# Patient Record
Sex: Male | Born: 1945
Health system: Southern US, Community
[De-identification: ages and names within clinical notes are randomized; demographics above are authoritative.]

## PROBLEM LIST (undated history)

## (undated) DIAGNOSIS — I350 Nonrheumatic aortic (valve) stenosis: Secondary | ICD-10-CM

## (undated) DIAGNOSIS — N281 Cyst of kidney, acquired: Secondary | ICD-10-CM

## (undated) DIAGNOSIS — C439 Malignant melanoma of skin, unspecified: Secondary | ICD-10-CM

## (undated) DIAGNOSIS — E785 Hyperlipidemia, unspecified: Secondary | ICD-10-CM

## (undated) DIAGNOSIS — K219 Gastro-esophageal reflux disease without esophagitis: Secondary | ICD-10-CM

## (undated) DIAGNOSIS — K4090 Unilateral inguinal hernia, without obstruction or gangrene, not specified as recurrent: Secondary | ICD-10-CM

## (undated) DIAGNOSIS — I10 Essential (primary) hypertension: Secondary | ICD-10-CM

## (undated) DIAGNOSIS — I7143 Infrarenal abdominal aortic aneurysm, without rupture: Secondary | ICD-10-CM

## (undated) DIAGNOSIS — I77810 Thoracic aortic ectasia: Secondary | ICD-10-CM

## (undated) DIAGNOSIS — M109 Gout, unspecified: Secondary | ICD-10-CM

## (undated) DIAGNOSIS — M1990 Unspecified osteoarthritis, unspecified site: Secondary | ICD-10-CM

## (undated) DIAGNOSIS — L0390 Cellulitis, unspecified: Secondary | ICD-10-CM

## (undated) DIAGNOSIS — I209 Angina pectoris, unspecified: Secondary | ICD-10-CM

## (undated) DIAGNOSIS — Z7902 Long term (current) use of antithrombotics/antiplatelets: Secondary | ICD-10-CM

## (undated) DIAGNOSIS — D689 Coagulation defect, unspecified: Secondary | ICD-10-CM

## (undated) DIAGNOSIS — E669 Obesity, unspecified: Secondary | ICD-10-CM

## (undated) DIAGNOSIS — D649 Anemia, unspecified: Secondary | ICD-10-CM

## (undated) DIAGNOSIS — N200 Calculus of kidney: Secondary | ICD-10-CM

## (undated) DIAGNOSIS — I2510 Atherosclerotic heart disease of native coronary artery without angina pectoris: Secondary | ICD-10-CM

## (undated) DIAGNOSIS — R011 Cardiac murmur, unspecified: Secondary | ICD-10-CM

## (undated) DIAGNOSIS — M51369 Other intervertebral disc degeneration, lumbar region without mention of lumbar back pain or lower extremity pain: Secondary | ICD-10-CM

## (undated) HISTORY — DX: Cellulitis, unspecified: L03.90

## (undated) HISTORY — DX: Atherosclerotic heart disease of native coronary artery without angina pectoris: I25.10

## (undated) HISTORY — DX: Essential (primary) hypertension: I10

## (undated) HISTORY — DX: Unspecified osteoarthritis, unspecified site: M19.90

## (undated) HISTORY — DX: Gout, unspecified: M10.9

## (undated) HISTORY — DX: Obesity, unspecified: E66.9

## (undated) HISTORY — DX: Cardiac murmur, unspecified: R01.1

## (undated) HISTORY — PX: HERNIA REPAIR: SHX51

## (undated) HISTORY — DX: Gastro-esophageal reflux disease without esophagitis: K21.9

## (undated) HISTORY — DX: Anemia, unspecified: D64.9

## (undated) HISTORY — DX: Malignant melanoma of skin, unspecified: C43.9

## (undated) HISTORY — DX: Coagulation defect, unspecified: D68.9

---

## 2004-06-02 ENCOUNTER — Inpatient Hospital Stay: Payer: Self-pay | Admitting: Cardiovascular Disease

## 2004-08-27 HISTORY — PX: CORONARY STENT PLACEMENT: SHX1402

## 2004-08-27 HISTORY — PX: CARDIAC CATHETERIZATION: SHX172

## 2004-12-07 ENCOUNTER — Ambulatory Visit: Payer: Self-pay | Admitting: Cardiovascular Disease

## 2004-12-07 DIAGNOSIS — I2510 Atherosclerotic heart disease of native coronary artery without angina pectoris: Secondary | ICD-10-CM

## 2004-12-07 HISTORY — PX: CORONARY STENT PLACEMENT: SHX1402

## 2004-12-07 HISTORY — DX: Atherosclerotic heart disease of native coronary artery without angina pectoris: I25.10

## 2004-12-07 HISTORY — PX: LEFT HEART CATH AND CORONARY ANGIOGRAPHY: CATH118249

## 2005-01-09 ENCOUNTER — Encounter: Payer: Self-pay | Admitting: Cardiovascular Disease

## 2008-09-10 ENCOUNTER — Ambulatory Visit: Payer: Self-pay | Admitting: Cardiovascular Disease

## 2008-09-10 HISTORY — PX: LEFT HEART CATH AND CORONARY ANGIOGRAPHY: CATH118249

## 2009-12-05 ENCOUNTER — Ambulatory Visit: Payer: Self-pay | Admitting: Family Medicine

## 2009-12-13 ENCOUNTER — Ambulatory Visit: Payer: Self-pay | Admitting: Internal Medicine

## 2013-06-27 DIAGNOSIS — C439 Malignant melanoma of skin, unspecified: Secondary | ICD-10-CM

## 2013-06-27 HISTORY — DX: Malignant melanoma of skin, unspecified: C43.9

## 2013-10-16 ENCOUNTER — Ambulatory Visit: Payer: Self-pay | Admitting: Gastroenterology

## 2014-11-05 ENCOUNTER — Ambulatory Visit: Payer: Self-pay | Admitting: Family Medicine

## 2014-12-07 ENCOUNTER — Institutional Professional Consult (permissible substitution): Payer: Self-pay | Admitting: Internal Medicine

## 2015-04-13 ENCOUNTER — Encounter: Payer: Self-pay | Admitting: Family Medicine

## 2015-04-13 ENCOUNTER — Ambulatory Visit (INDEPENDENT_AMBULATORY_CARE_PROVIDER_SITE_OTHER): Payer: 59 | Admitting: Family Medicine

## 2015-04-13 VITALS — BP 119/79 | HR 69 | Temp 98.6°F | Ht 69.4 in | Wt 198.0 lb

## 2015-04-13 DIAGNOSIS — E785 Hyperlipidemia, unspecified: Secondary | ICD-10-CM | POA: Diagnosis not present

## 2015-04-13 DIAGNOSIS — I10 Essential (primary) hypertension: Secondary | ICD-10-CM | POA: Diagnosis not present

## 2015-04-13 DIAGNOSIS — M1000 Idiopathic gout, unspecified site: Secondary | ICD-10-CM

## 2015-04-13 DIAGNOSIS — I2510 Atherosclerotic heart disease of native coronary artery without angina pectoris: Secondary | ICD-10-CM

## 2015-04-13 DIAGNOSIS — I2583 Coronary atherosclerosis due to lipid rich plaque: Secondary | ICD-10-CM

## 2015-04-13 DIAGNOSIS — M109 Gout, unspecified: Secondary | ICD-10-CM | POA: Insufficient documentation

## 2015-04-13 LAB — LP+ALT+AST PICCOLO, WAIVED
ALT (SGPT) PICCOLO, WAIVED: 30 U/L (ref 10–47)
AST (SGOT) PICCOLO, WAIVED: 24 U/L (ref 11–38)
CHOL/HDL RATIO PICCOLO,WAIVE: 4.1 mg/dL
CHOLESTEROL PICCOLO, WAIVED: 161 mg/dL (ref <200)
HDL CHOL PICCOLO, WAIVED: 40 mg/dL — AB (ref >59)
LDL Chol Calc Piccolo Waived: 90 mg/dL (ref <100)
TRIGLYCERIDES PICCOLO,WAIVED: 159 mg/dL — AB (ref <150)
VLDL Chol Calc Piccolo,Waive: 32 mg/dL — ABNORMAL HIGH (ref <30)

## 2015-04-13 MED ORDER — ALLOPURINOL 100 MG PO TABS: 100.0000 mg | ORAL_TABLET | Freq: Every day | ORAL | Status: DC

## 2015-04-13 MED ORDER — CLOPIDOGREL BISULFATE 75 MG PO TABS: 75.0000 mg | ORAL_TABLET | Freq: Every day | ORAL | Status: DC

## 2015-04-13 MED ORDER — LISINOPRIL 20 MG PO TABS: 20.0000 mg | ORAL_TABLET | Freq: Every day | ORAL | Status: DC

## 2015-04-13 MED ORDER — METOPROLOL SUCCINATE ER 50 MG PO TB24: 50.0000 mg | ORAL_TABLET | Freq: Every day | ORAL | Status: DC

## 2015-04-13 MED ORDER — MELOXICAM 15 MG PO TABS: 15.0000 mg | ORAL_TABLET | Freq: Every day | ORAL | Status: DC

## 2015-04-13 MED ORDER — RABEPRAZOLE SODIUM 20 MG PO TBEC: 20.0000 mg | DELAYED_RELEASE_TABLET | Freq: Every day | ORAL | Status: DC

## 2015-04-13 MED ORDER — EZETIMIBE-SIMVASTATIN 10-40 MG PO TABS: 1.0000 | ORAL_TABLET | Freq: Every day | ORAL | Status: DC

## 2015-04-13 NOTE — Progress Notes (Signed)
BP 119/79 mmHg  Pulse 69  Temp(Src) 98.6 F (37 C)  Ht 5' 9.4" (1.763 m)  Wt 198 lb (89.812 kg)  BMI 28.90 kg/m2  SpO2 98%   Subjective:    Patient ID: Mark Quinn, male    DOB: May 29, 1946, 69 y.o.   MRN: [id number]  HPI: Mark Quinn is a 69 y.o. male  Chief Complaint  Patient presents with  . Hypertension  . Hyperlipidemia   patient for follow-up medications doing well with blood pressure cholesterol. Occasional slight sore toe but nothing special taking allopurinol without problems. Stomach and reflux doing well with no issues Takes medications faithfully with no side effects.  Patient's cough is gotten better with cleaning out mold in a closed up room. No further cough symptoms. Relevant past medical, surgical, family and social history reviewed and updated as indicated. Interim medical history since our last visit reviewed.[id number] Allergies and medications reviewed and updated.  Review of Systems  Constitutional: Negative.   Respiratory: Negative.   Cardiovascular: Negative.     Per HPI unless specifically indicated above     Objective:    BP 119/79 mmHg  Pulse 69  Temp(Src) 98.6 F (37 C)  Ht 5' 9.4" (1.763 m)  Wt 198 lb (89.812 kg)  BMI 28.90 kg/m2  SpO2 98%  Wt Readings from Last 3 Encounters:  04/13/15 198 lb (89.812 kg)  11/05/14 201 lb (91.173 kg)    Physical Exam  Constitutional: He is oriented to person, place, and time. He appears well-developed and well-nourished. No distress.  HENT:  Head: Normocephalic and atraumatic.  Right Ear: Hearing normal.  Left Ear: Hearing normal.  Nose: Nose normal.  Eyes: Conjunctivae and lids are normal. Right eye exhibits no discharge. Left eye exhibits no discharge. No scleral icterus.  Cardiovascular: Normal rate, regular rhythm and normal heart sounds.   Pulmonary/Chest: Effort normal and breath sounds normal. No respiratory distress.  Musculoskeletal: Normal range of motion.   Neurological: He is alert and oriented to person, place, and time.  Skin: Skin is intact. No rash noted.  Psychiatric: He has a normal mood and affect. His speech is normal and behavior is normal. Judgment and thought content normal. Cognition and memory are normal.    No results found for this or any previous visit.    Assessment & Plan:   Problem List Items Addressed This Visit      Cardiovascular and Mediastinum   Essential hypertension, benign    The current medical regimen is effective;  continue present plan and medications.       Relevant Medications   metoprolol succinate (TOPROL-XL) 50 MG 24 hr tablet   lisinopril (PRINIVIL,ZESTRIL) 20 MG tablet   ezetimibe-simvastatin (VYTORIN) 10-40 MG per tablet   Other Relevant Orders   LP+ALT+AST Piccolo, Waived   Basic metabolic panel   CAD (coronary artery disease)    Stable no signs or sx      Relevant Medications   clopidogrel (PLAVIX) 75 MG tablet   metoprolol succinate (TOPROL-XL) 50 MG 24 hr tablet   lisinopril (PRINIVIL,ZESTRIL) 20 MG tablet   ezetimibe-simvastatin (VYTORIN) 10-40 MG per tablet     Other   Hyperlipemia - Primary    The current medical regimen is effective;  continue present plan and medications.       Relevant Medications   metoprolol succinate (TOPROL-XL) 50 MG 24 hr tablet   lisinopril (PRINIVIL,ZESTRIL) 20 MG tablet   ezetimibe-simvastatin (VYTORIN) 10-40 MG per tablet   Other  Relevant Orders   LP+ALT+AST Piccolo, Waived   Basic metabolic panel   Gout    The current medical regimen is effective;  continue present plan and medications.       Relevant Medications   meloxicam (MOBIC) 15 MG tablet   allopurinol (ZYLOPRIM) 100 MG tablet       Follow up plan: Return in about 6 months (around 10/14/2015), or if symptoms worsen or fail to improve, for Physical Exam.

## 2015-04-13 NOTE — Assessment & Plan Note (Signed)
The current medical regimen is effective;  continue present plan and medications.  

## 2015-04-13 NOTE — Assessment & Plan Note (Signed)
Stable no signs or sx

## 2015-04-14 ENCOUNTER — Encounter: Payer: Self-pay | Admitting: Family Medicine

## 2015-04-14 LAB — BASIC METABOLIC PANEL
BUN / CREAT RATIO: 18 (ref 10–22)
BUN: 18 mg/dL (ref 8–27)
CHLORIDE: 101 mmol/L (ref 97–108)
CO2: 25 mmol/L (ref 18–29)
CREATININE: 0.99 mg/dL (ref 0.76–1.27)
Calcium: 9.2 mg/dL (ref 8.6–10.2)
GFR calc Af Amer: 91 mL/min/{1.73_m2} (ref >59)
GFR calc non Af Amer: 79 mL/min/{1.73_m2} (ref >59)
GLUCOSE: 97 mg/dL (ref 65–99)
POTASSIUM: 3.9 mmol/L (ref 3.5–5.2)
SODIUM: 142 mmol/L (ref 134–144)

## 2015-05-09 ENCOUNTER — Other Ambulatory Visit: Payer: Self-pay | Admitting: Family Medicine

## 2015-05-26 ENCOUNTER — Other Ambulatory Visit: Payer: Self-pay | Admitting: Family Medicine

## 2015-07-26 ENCOUNTER — Encounter: Payer: Self-pay | Admitting: Family Medicine

## 2015-07-26 ENCOUNTER — Ambulatory Visit (INDEPENDENT_AMBULATORY_CARE_PROVIDER_SITE_OTHER): Payer: 59 | Admitting: Family Medicine

## 2015-07-26 VITALS — BP 128/78 | HR 102 | Temp 99.1°F | Ht 69.4 in | Wt 201.0 lb

## 2015-07-26 DIAGNOSIS — J019 Acute sinusitis, unspecified: Secondary | ICD-10-CM

## 2015-07-26 DIAGNOSIS — J329 Chronic sinusitis, unspecified: Secondary | ICD-10-CM | POA: Insufficient documentation

## 2015-07-26 MED ORDER — HYDROCOD POLST-CPM POLST ER 10-8 MG/5ML PO SUER: 2.5000 mL | Freq: Two times a day (BID) | ORAL | Status: DC | PRN

## 2015-07-26 MED ORDER — AMOXICILLIN 875 MG PO TABS: 875.0000 mg | ORAL_TABLET | Freq: Two times a day (BID) | ORAL | Status: DC

## 2015-07-26 NOTE — Progress Notes (Signed)
BP 128/78 mmHg  Pulse 102  Temp(Src) 99.1 F (37.3 C)  Ht 5' 9.4" (1.763 m)  Wt 201 lb (91.173 kg)  BMI 29.33 kg/m2  SpO2 96%   Subjective:    Patient ID: Mark Quinn, male    DOB: 09/19/1945, 69 y.o.   MRN: [id number]  HPI: Mark Quinn is a 69 y.o. male  Chief Complaint  Patient presents with  . URI    x 5days   , Sinus pressure congestion feeling bad drainage is use Mucinex D and Tylenol with some relief developed a cough and green stuff out of his nose. Has been running a fever and generalized achiness feels bad all over  Relevant past medical, surgical, family and social history reviewed and updated as indicated. Interim medical history since our last visit reviewed. Allergies and medications reviewed and updated.  Review of Systems  Constitutional: Positive for fever, chills and fatigue.  HENT: Positive for congestion, ear pain, rhinorrhea, sinus pressure, sneezing and sore throat.   Respiratory: Positive for cough. Negative for wheezing.   Cardiovascular: Negative for chest pain, palpitations and leg swelling.  Gastrointestinal: Negative.     Per HPI unless specifically indicated above     Objective:    BP 128/78 mmHg  Pulse 102  Temp(Src) 99.1 F (37.3 C)  Ht 5' 9.4" (1.763 m)  Wt 201 lb (91.173 kg)  BMI 29.33 kg/m2  SpO2 96%  Wt Readings from Last 3 Encounters:  07/26/15 201 lb (91.173 kg)  04/13/15 198 lb (89.812 kg)  11/05/14 201 lb (91.173 kg)    Physical Exam  Constitutional: He is oriented to person, place, and time. He appears well-developed and well-nourished. No distress.  HENT:  Head: Normocephalic and atraumatic.  Right Ear: Hearing and external ear normal.  Left Ear: Hearing and external ear normal.  Nose: Nose normal.  Mouth/Throat: Oropharyngeal exudate present.  Eyes: Conjunctivae and lids are normal. Right eye exhibits no discharge. Left eye exhibits no discharge. No scleral icterus.  Cardiovascular: Normal rate and regular  rhythm.   Pulmonary/Chest: Effort normal and breath sounds normal. No respiratory distress. He has no wheezes. He has no rales. He exhibits no tenderness.  Musculoskeletal: Normal range of motion.  Lymphadenopathy:    He has no cervical adenopathy.  Neurological: He is alert and oriented to person, place, and time.  Skin: Skin is intact. No rash noted.  Psychiatric: He has a normal mood and affect. His speech is normal and behavior is normal. Judgment and thought content normal. Cognition and memory are normal.    Results for orders placed or performed in visit on 04/13/15  LP+ALT+AST Piccolo, [employer]  Result Value Ref Range   ALT (SGPT) Piccolo, Waived 30 10 - 47 U/L   AST (SGOT) Piccolo, Waived 24 11 - 38 U/L   Cholesterol Piccolo, Waived 161 <200 mg/dL   HDL Chol Piccolo, Waived 40 (L) >59 mg/dL   Triglycerides Piccolo,Waived 159 (H) <150 mg/dL   Chol/HDL Ratio Piccolo,Waive 4.1 mg/dL   LDL Chol Calc Piccolo Waived 90 <100 mg/dL   VLDL Chol Calc Piccolo,Waive 32 (H) <30 mg/dL  Basic metabolic panel  Result Value Ref Range   Glucose 97 65 - 99 mg/dL   BUN 18 8 - 27 mg/dL   Creatinine, Ser 0.99 0.76 - 1.27 mg/dL   GFR calc non Af Amer 79 >59 mL/min/1.73   GFR calc Af Amer 91 >59 mL/min/1.73   BUN/Creatinine Ratio 18 10 - 22  Sodium 142 134 - 144 mmol/L   Potassium 3.9 3.5 - 5.2 mmol/L   Chloride 101 97 - 108 mmol/L   CO2 25 18 - 29 mmol/L   Calcium 9.2 8.6 - 10.2 mg/dL      Assessment & Plan:   Problem List Items Addressed This Visit      Respiratory   Sinusitis - Primary    Discussed sinusitis care and treatment use of Mucinex D, Tylenol, antibiotics Staying out of work Cough medicine and cautioned about driving and staying at home with codeine containing medication      Relevant Medications   amoxicillin (AMOXIL) 875 MG tablet   chlorpheniramine-HYDROcodone (TUSSIONEX PENNKINETIC ER) 10-8 MG/5ML SUER       Follow up plan: Return for As scheduled.

## 2015-07-26 NOTE — Assessment & Plan Note (Signed)
Discussed sinusitis care and treatment use of Mucinex D, Tylenol, antibiotics Staying out of work Cough medicine and cautioned about driving and staying at home with codeine containing medication

## 2015-09-26 ENCOUNTER — Telehealth: Payer: Self-pay | Admitting: Family Medicine

## 2015-09-26 ENCOUNTER — Encounter: Payer: 59 | Admitting: Family Medicine

## 2015-09-26 ENCOUNTER — Other Ambulatory Visit: Payer: Self-pay | Admitting: Family Medicine

## 2015-09-26 MED ORDER — AMOXICILLIN 875 MG PO TABS: 875.0000 mg | ORAL_TABLET | Freq: Two times a day (BID) | ORAL | Status: DC

## 2015-09-26 NOTE — Telephone Encounter (Signed)
Pt's wife called stated pt has the same thing he had right around Thanksgiving. Wants to know if an antibiotic can be called in. Pt informed an appt would likely be needed due to the amount of time since that illness. Pt's wife instructed staff to send a message just in case. Pharm is [facility] in [place]. Thanks.  sent

## 2015-09-26 NOTE — Telephone Encounter (Signed)
Pt's wife called stated pt has the same thing he had right around Thanksgiving. Wants to know if an antibiotic can be called in. Pt informed an appt would likely be needed due to the amount of time since that illness. Pt's wife instructed staff to send a message just in case. Pharm is [facility] in [place]. Thanks.

## 2015-09-29 ENCOUNTER — Encounter: Payer: Self-pay | Admitting: Family Medicine

## 2015-09-29 ENCOUNTER — Ambulatory Visit (INDEPENDENT_AMBULATORY_CARE_PROVIDER_SITE_OTHER): Payer: 59 | Admitting: Family Medicine

## 2015-09-29 VITALS — BP 137/82 | HR 69 | Temp 98.0°F | Ht 71.1 in | Wt 199.0 lb

## 2015-09-29 DIAGNOSIS — J019 Acute sinusitis, unspecified: Secondary | ICD-10-CM

## 2015-09-29 MED ORDER — HYDROCOD POLST-CPM POLST ER 10-8 MG/5ML PO SUER: 2.5000 mL | Freq: Two times a day (BID) | ORAL | Status: DC | PRN

## 2015-09-29 NOTE — Assessment & Plan Note (Signed)
Discussed care and treatment of sinusitis Will continue antibiotic will also give Tussionex today

## 2015-09-29 NOTE — Progress Notes (Signed)
BP 137/82 mmHg  Pulse 69  Temp(Src) 98 F (36.7 C)  Ht 5' 11.1" (1.806 m)  Wt 199 lb (90.266 kg)  BMI 27.68 kg/m2  SpO2 99%   Subjective:    Patient ID: Mark Quinn, male    DOB: 1946/05/24, 70 y.o.   MRN: [id number]  HPI: Mark Quinn is a 70 y.o. male  Chief Complaint  Patient presents with  . URI    chills, coughing  Fever ache patient doing better with antibiotic that was called in 3 days ago quit having fever and chill has been able to go back to work No side effects from medications Still coughing real bad at night wants some cough medicine   Relevant past medical, surgical, family and social history reviewed and updated as indicated. Interim medical history since our last visit reviewed. Allergies and medications reviewed and updated.  Review of Systems  Per HPI unless specifically indicated above     Objective:    BP 137/82 mmHg  Pulse 69  Temp(Src) 98 F (36.7 C)  Ht 5' 11.1" (1.806 m)  Wt 199 lb (90.266 kg)  BMI 27.68 kg/m2  SpO2 99%  Wt Readings from Last 3 Encounters:  09/29/15 199 lb (90.266 kg)  07/26/15 201 lb (91.173 kg)  04/13/15 198 lb (89.812 kg)    Physical Exam  Constitutional: He is oriented to person, place, and time. He appears well-developed and well-nourished. No distress.  HENT:  Head: Normocephalic and atraumatic.  Right Ear: Hearing normal.  Left Ear: Hearing normal.  Nose: Nose normal.  Eyes: Conjunctivae and lids are normal. Right eye exhibits no discharge. Left eye exhibits no discharge. No scleral icterus.  Cardiovascular: Normal rate, regular rhythm and normal heart sounds.   Pulmonary/Chest: Effort normal and breath sounds normal. No respiratory distress.  Musculoskeletal: Normal range of motion.  Lymphadenopathy:    He has no cervical adenopathy.  Neurological: He is alert and oriented to person, place, and time.  Skin: Skin is intact. No rash noted.  Psychiatric: He has a normal mood and affect. His speech is  normal and behavior is normal. Judgment and thought content normal. Cognition and memory are normal.    Results for orders placed or performed in visit on 04/13/15  LP+ALT+AST Piccolo, [employer]  Result Value Ref Range   ALT (SGPT) Piccolo, Waived 30 10 - 47 U/L   AST (SGOT) Piccolo, Waived 24 11 - 38 U/L   Cholesterol Piccolo, Waived 161 <200 mg/dL   HDL Chol Piccolo, Waived 40 (L) >59 mg/dL   Triglycerides Piccolo,Waived 159 (H) <150 mg/dL   Chol/HDL Ratio Piccolo,Waive 4.1 mg/dL   LDL Chol Calc Piccolo Waived 90 <100 mg/dL   VLDL Chol Calc Piccolo,Waive 32 (H) <30 mg/dL  Basic metabolic panel  Result Value Ref Range   Glucose 97 65 - 99 mg/dL   BUN 18 8 - 27 mg/dL   Creatinine, Ser 0.99 0.76 - 1.27 mg/dL   GFR calc non Af Amer 79 >59 mL/min/1.73   GFR calc Af Amer 91 >59 mL/min/1.73   BUN/Creatinine Ratio 18 10 - 22   Sodium 142 134 - 144 mmol/L   Potassium 3.9 3.5 - 5.2 mmol/L   Chloride 101 97 - 108 mmol/L   CO2 25 18 - 29 mmol/L   Calcium 9.2 8.6 - 10.2 mg/dL      Assessment & Plan:   Problem List Items Addressed This Visit      Respiratory   Sinusitis -  Primary    Discussed care and treatment of sinusitis Will continue antibiotic will also give Tussionex today      Relevant Medications   chlorpheniramine-HYDROcodone (TUSSIONEX PENNKINETIC ER) 10-8 MG/5ML SUER       Follow up plan: Return for Physical Exam.

## 2015-10-28 ENCOUNTER — Encounter: Payer: Self-pay | Admitting: Unknown Physician Specialty

## 2015-10-28 ENCOUNTER — Ambulatory Visit (INDEPENDENT_AMBULATORY_CARE_PROVIDER_SITE_OTHER): Payer: 59 | Admitting: Unknown Physician Specialty

## 2015-10-28 VITALS — BP 147/93 | HR 105 | Temp 99.3°F | Ht 69.2 in | Wt 196.2 lb

## 2015-10-28 DIAGNOSIS — R059 Cough, unspecified: Secondary | ICD-10-CM

## 2015-10-28 DIAGNOSIS — R350 Frequency of micturition: Secondary | ICD-10-CM | POA: Diagnosis not present

## 2015-10-28 DIAGNOSIS — N400 Enlarged prostate without lower urinary tract symptoms: Secondary | ICD-10-CM | POA: Diagnosis not present

## 2015-10-28 DIAGNOSIS — N410 Acute prostatitis: Secondary | ICD-10-CM

## 2015-10-28 DIAGNOSIS — R05 Cough: Secondary | ICD-10-CM | POA: Diagnosis not present

## 2015-10-28 LAB — PLEASE NOTE:

## 2015-10-28 LAB — INFLUENZA A AND B
Influenza A Ag, EIA: NEGATIVE
Influenza B Ag, EIA: NEGATIVE

## 2015-10-28 MED ORDER — SULFAMETHOXAZOLE-TRIMETHOPRIM 800-160 MG PO TABS: 1.0000 | ORAL_TABLET | Freq: Two times a day (BID) | ORAL | Status: DC

## 2015-10-28 MED ORDER — TAMSULOSIN HCL 0.4 MG PO CAPS: 0.4000 mg | ORAL_CAPSULE | Freq: Every day | ORAL | Status: DC

## 2015-10-28 NOTE — Progress Notes (Signed)
BP 147/93 mmHg  Pulse 105  Temp(Src) 99.3 F (37.4 C)  Ht 5' 9.2" (1.758 m)  Wt 196 lb 3.2 oz (88.996 kg)  BMI 28.80 kg/m2  SpO2 95%   Subjective:    Patient ID: Mark Quinn, male    DOB: 1945-12-29, 70 y.o.   MRN: [id number]  HPI: Mark Quinn is a 70 y.o. male  Chief Complaint  Patient presents with  . URI    pt states he has been having headaches, body aches, chills, cough, and trouble urinating. States symptoms started Wednesday morning.   Pt with a 2 day history that started Wednesday night but Thursday morning things got much worse.  No thermometer but was having chills and sweats. Problems urinating started at the same time.  His cough is minimal but has a headache when he coughs.   He does have a history of a flu shot.  No nausea, vomiting, and diarrhea.    Relevant past medical, surgical, family and social history reviewed and updated as indicated. Interim medical history since our last visit reviewed. Allergies and medications reviewed and updated.  Review of Systems  All other systems reviewed and are negative.   Per HPI unless specifically indicated above     Objective:    BP 147/93 mmHg  Pulse 105  Temp(Src) 99.3 F (37.4 C)  Ht 5' 9.2" (1.758 m)  Wt 196 lb 3.2 oz (88.996 kg)  BMI 28.80 kg/m2  SpO2 95%  Wt Readings from Last 3 Encounters:  10/28/15 196 lb 3.2 oz (88.996 kg)  09/29/15 199 lb (90.266 kg)  07/26/15 201 lb (91.173 kg)    Physical Exam  Constitutional: He is oriented to person, place, and time. He appears well-developed and well-nourished. No distress.  HENT:  Head: Normocephalic and atraumatic.  Right Ear: Tympanic membrane and ear canal normal.  Left Ear: Tympanic membrane and ear canal normal.  Nose: Rhinorrhea present. Right sinus exhibits no maxillary sinus tenderness and no frontal sinus tenderness. Left sinus exhibits no maxillary sinus tenderness and no frontal sinus tenderness.  Mouth/Throat: Uvula is midline. Posterior  oropharyngeal edema present.  Eyes: Conjunctivae and lids are normal. Right eye exhibits no discharge. Left eye exhibits no discharge. No scleral icterus.  Neck: Neck supple.  Cardiovascular: Normal rate, regular rhythm and normal heart sounds.   Pulmonary/Chest: Effort normal and breath sounds normal. No respiratory distress.  Abdominal: Normal appearance. There is no splenomegaly or hepatomegaly.  Genitourinary: Prostate is enlarged and tender.  Musculoskeletal: Normal range of motion.  Neurological: He is alert and oriented to person, place, and time.  Skin: Skin is warm, dry and intact. No rash noted. No pallor.  Psychiatric: He has a normal mood and affect. His behavior is normal. Judgment and thought content normal.  Nursing note and vitals reviewed.   Results for orders placed or performed in visit on 04/13/15  LP+ALT+AST Piccolo, Waived  Result Value Ref Range   ALT (SGPT) Piccolo, Waived 30 10 - 47 U/L   AST (SGOT) Piccolo, Waived 24 11 - 38 U/L   Cholesterol Piccolo, Waived 161 <200 mg/dL   HDL Chol Piccolo, Waived 40 (L) >59 mg/dL   Triglycerides Piccolo,Waived 159 (H) <150 mg/dL   Chol/HDL Ratio Piccolo,Waive 4.1 mg/dL   LDL Chol Calc Piccolo Waived 90 <100 mg/dL   VLDL Chol Calc Piccolo,Waive 32 (H) <30 mg/dL  Basic metabolic panel  Result Value Ref Range   Glucose 97 65 - 99 mg/dL   BUN  18 8 - 27 mg/dL   Creatinine, Ser 0.99 0.76 - 1.27 mg/dL   GFR calc non Af Amer 79 >59 mL/min/1.73   GFR calc Af Amer 91 >59 mL/min/1.73   BUN/Creatinine Ratio 18 10 - 22   Sodium 142 134 - 144 mmol/L   Potassium 3.9 3.5 - 5.2 mmol/L   Chloride 101 97 - 108 mmol/L   CO2 25 18 - 29 mmol/L   Calcium 9.2 8.6 - 10.2 mg/dL      Assessment & Plan:   Problem List Items Addressed This Visit      Unprioritized   BPH (benign prostatic hyperplasia)   Relevant Medications   tamsulosin (FLOMAX) 0.4 MG CAPS capsule    Other Visit Diagnoses    Cough    -  Primary    Relevant Orders     Influenza a and b    Urinary frequency        Relevant Orders    UA/M w/rflx Culture, Routine    Acute prostatitis        Rx for Septra BID.  Flomax to help with urination.  Follow-up with Dr. Jeananne Rama in about 2 weeks.          Follow up plan: No Follow-up on file.

## 2015-10-28 NOTE — Patient Instructions (Signed)

## 2015-11-01 ENCOUNTER — Other Ambulatory Visit: Payer: Self-pay

## 2015-11-01 ENCOUNTER — Telehealth: Payer: Self-pay | Admitting: Family Medicine

## 2015-11-01 DIAGNOSIS — E785 Hyperlipidemia, unspecified: Secondary | ICD-10-CM

## 2015-11-01 DIAGNOSIS — I10 Essential (primary) hypertension: Secondary | ICD-10-CM

## 2015-11-01 DIAGNOSIS — I2583 Coronary atherosclerosis due to lipid rich plaque: Secondary | ICD-10-CM

## 2015-11-01 DIAGNOSIS — I2510 Atherosclerotic heart disease of native coronary artery without angina pectoris: Secondary | ICD-10-CM

## 2015-11-01 DIAGNOSIS — M1000 Idiopathic gout, unspecified site: Secondary | ICD-10-CM

## 2015-11-01 DIAGNOSIS — Z113 Encounter for screening for infections with a predominantly sexual mode of transmission: Secondary | ICD-10-CM

## 2015-11-01 DIAGNOSIS — N400 Enlarged prostate without lower urinary tract symptoms: Secondary | ICD-10-CM

## 2015-11-01 NOTE — Telephone Encounter (Signed)
Pt's wife called asked that whoever returns their call please call the house number. Thanks

## 2015-11-01 NOTE — Telephone Encounter (Signed)
Pt would like to come in Tuesday afternoon to have labs drawn for cpe on the 16th.

## 2015-11-01 NOTE — Telephone Encounter (Signed)
Labs ordered.

## 2015-11-01 NOTE — Telephone Encounter (Signed)
This patient wants to come in early for labs, CPE on 11/10/15  Please enter what you want.

## 2015-11-03 LAB — MICROSCOPIC EXAMINATION: Epithelial Cells (non renal): NONE SEEN /hpf (ref 0–10)

## 2015-11-03 LAB — UA/M W/RFLX CULTURE, ROUTINE
BILIRUBIN UA: NEGATIVE
GLUCOSE, UA: NEGATIVE
NITRITE UA: NEGATIVE
SPEC GRAV UA: 1.025 (ref 1.005–1.030)
Urobilinogen, Ur: 1 mg/dL (ref 0.2–1.0)
pH, UA: 6 (ref 5.0–7.5)

## 2015-11-03 LAB — URINE CULTURE, REFLEX

## 2015-11-07 ENCOUNTER — Other Ambulatory Visit: Payer: 59

## 2015-11-10 ENCOUNTER — Encounter: Payer: Self-pay | Admitting: Family Medicine

## 2015-11-10 ENCOUNTER — Ambulatory Visit (INDEPENDENT_AMBULATORY_CARE_PROVIDER_SITE_OTHER): Payer: 59 | Admitting: Family Medicine

## 2015-11-10 VITALS — BP 103/70 | HR 82 | Temp 98.9°F | Ht 69.7 in | Wt 196.0 lb

## 2015-11-10 DIAGNOSIS — E785 Hyperlipidemia, unspecified: Secondary | ICD-10-CM

## 2015-11-10 DIAGNOSIS — I2510 Atherosclerotic heart disease of native coronary artery without angina pectoris: Secondary | ICD-10-CM | POA: Diagnosis not present

## 2015-11-10 DIAGNOSIS — I10 Essential (primary) hypertension: Secondary | ICD-10-CM

## 2015-11-10 DIAGNOSIS — C4361 Malignant melanoma of right upper limb, including shoulder: Secondary | ICD-10-CM

## 2015-11-10 DIAGNOSIS — Z113 Encounter for screening for infections with a predominantly sexual mode of transmission: Secondary | ICD-10-CM

## 2015-11-10 DIAGNOSIS — M1000 Idiopathic gout, unspecified site: Secondary | ICD-10-CM | POA: Diagnosis not present

## 2015-11-10 DIAGNOSIS — I2583 Coronary atherosclerosis due to lipid rich plaque: Secondary | ICD-10-CM

## 2015-11-10 DIAGNOSIS — Z0000 Encounter for general adult medical examination without abnormal findings: Secondary | ICD-10-CM

## 2015-11-10 DIAGNOSIS — N400 Enlarged prostate without lower urinary tract symptoms: Secondary | ICD-10-CM

## 2015-11-10 LAB — URINALYSIS, ROUTINE W REFLEX MICROSCOPIC
Bilirubin, UA: NEGATIVE
GLUCOSE, UA: NEGATIVE
KETONES UA: NEGATIVE
Leukocytes, UA: NEGATIVE
NITRITE UA: NEGATIVE
Protein, UA: NEGATIVE
UUROB: 0.2 mg/dL (ref 0.2–1.0)
pH, UA: 6 (ref 5.0–7.5)

## 2015-11-10 LAB — MICROSCOPIC EXAMINATION

## 2015-11-10 MED ORDER — CLOPIDOGREL BISULFATE 75 MG PO TABS: 75.0000 mg | ORAL_TABLET | Freq: Every day | ORAL | Status: DC

## 2015-11-10 MED ORDER — RABEPRAZOLE SODIUM 20 MG PO TBEC: 20.0000 mg | DELAYED_RELEASE_TABLET | Freq: Every day | ORAL | Status: DC

## 2015-11-10 MED ORDER — EZETIMIBE-SIMVASTATIN 10-40 MG PO TABS: 1.0000 | ORAL_TABLET | Freq: Every day | ORAL | Status: DC

## 2015-11-10 MED ORDER — ALLOPURINOL 100 MG PO TABS: 100.0000 mg | ORAL_TABLET | Freq: Every day | ORAL | Status: DC

## 2015-11-10 MED ORDER — METOPROLOL SUCCINATE ER 50 MG PO TB24: 50.0000 mg | ORAL_TABLET | Freq: Every day | ORAL | Status: DC

## 2015-11-10 MED ORDER — LISINOPRIL 20 MG PO TABS: 20.0000 mg | ORAL_TABLET | Freq: Every day | ORAL | Status: DC

## 2015-11-10 MED ORDER — TAMSULOSIN HCL 0.4 MG PO CAPS: 0.4000 mg | ORAL_CAPSULE | Freq: Every day | ORAL | Status: DC

## 2015-11-10 NOTE — Progress Notes (Signed)
BP 103/70 mmHg  Pulse 82  Temp(Src) 98.9 F (37.2 C)  Ht 5' 9.7" (1.77 m)  Wt 196 lb (88.905 kg)  BMI 28.38 kg/m2  SpO2 98%   Subjective:    Patient ID: Mark Quinn, male    DOB: 07-02-1946, 70 y.o.   MRN: [id number]  HPI: Mark Quinn is a 70 y.o. male  Chief Complaint  Patient presents with  . Annual Exam  . Patient's wife had called to mention patient having some SOB   patient's prostate infection has resolved patient feeling much better finishing up Bactrim and doing well no side effects taking Flomax which is really helped and wants to continue this doesn't have to get up as much at night. Blood pressure medicine cholesterol doing well with no complaints Taking Plavix no issues Reflux stable Not using an inhaler   Relevant past medical, surgical, family and social history reviewed and updated as indicated. Interim medical history since our last visit reviewed. Allergies and medications reviewed and updated.  Review of Systems  Constitutional: Negative.   HENT: Negative.   Eyes: Negative.   Respiratory: Positive for shortness of breath.        Mild breathing hard no chest pain  Cardiovascular: Negative.   Gastrointestinal: Negative.   Endocrine: Negative.   Genitourinary: Negative.   Musculoskeletal: Negative.   Skin: Negative.   Allergic/Immunologic: Negative.   Neurological: Negative.   Hematological: Negative.   Psychiatric/Behavioral: Negative.     Per HPI unless specifically indicated above     Objective:    BP 103/70 mmHg  Pulse 82  Temp(Src) 98.9 F (37.2 C)  Ht 5' 9.7" (1.77 m)  Wt 196 lb (88.905 kg)  BMI 28.38 kg/m2  SpO2 98%  Wt Readings from Last 3 Encounters:  11/10/15 196 lb (88.905 kg)  10/28/15 196 lb 3.2 oz (88.996 kg)  09/29/15 199 lb (90.266 kg)    Physical Exam  Constitutional: He is oriented to person, place, and time. He appears well-developed and well-nourished.  HENT:  Head: Normocephalic.  Right Ear: External  ear normal.  Left Ear: External ear normal.  Nose: Nose normal.  Eyes: Conjunctivae and EOM are normal. Pupils are equal, round, and reactive to light.  Neck: Normal range of motion. Neck supple. No thyromegaly present.  Cardiovascular: Normal rate, regular rhythm, normal heart sounds and intact distal pulses.   Pulmonary/Chest: Effort normal and breath sounds normal.  Abdominal: Soft. Bowel sounds are normal. There is no splenomegaly or hepatomegaly.  Genitourinary: Penis normal.  Musculoskeletal: Normal range of motion.  Lymphadenopathy:    He has no cervical adenopathy.  Neurological: He is alert and oriented to person, place, and time. He has normal reflexes.  Skin: Skin is warm and dry.  Psychiatric: He has a normal mood and affect. His behavior is normal. Judgment and thought content normal.    Results for orders placed or performed in visit on 10/28/15  Influenza a and b  Result Value Ref Range   Influenza A Ag, EIA Negative Negative   Influenza B Ag, EIA Negative Negative   Influenza Comment See note   Microscopic Examination  Result Value Ref Range   WBC, UA 6-10 (A) 0 -  5 /hpf   RBC, UA 0-2 0 -  2 /hpf   Epithelial Cells (non renal) None seen 0 - 10 /hpf   Mucus, UA Present (A) Not Estab.   Bacteria, UA Few (A) None seen/Few  UA/M w/rflx Culture, Routine  Result Value Ref Range   Specific Gravity, UA 1.025 1.005 - 1.030   pH, UA 6.0 5.0 - 7.5   Color, UA Yellow Yellow   Appearance Ur Clear Clear   Leukocytes, UA 1+ (A) Negative   Protein, UA 2+ (A) Negative/Trace   Glucose, UA Negative Negative   Ketones, UA Trace (A) Negative   RBC, UA 2+ (A) Negative   Bilirubin, UA Negative Negative   Urobilinogen, Ur 1.0 0.2 - 1.0 mg/dL   Nitrite, UA Negative Negative   Microscopic Examination See below:    Urinalysis Reflex Comment   Please note:  Result Value Ref Range   Please note: Comment   Urine Culture, Routine  Result Value Ref Range   Urine Culture, Routine  Final report (A)    Urine Culture result 1 Serratia marcescens (A)    ANTIMICROBIAL SUSCEPTIBILITY Comment       Assessment & Plan:   Problem List Items Addressed This Visit      Cardiovascular and Mediastinum   Essential hypertension, benign    The current medical regimen is effective;  continue present plan and medications.       Relevant Medications   ezetimibe-simvastatin (VYTORIN) 10-40 MG tablet   lisinopril (PRINIVIL,ZESTRIL) 20 MG tablet   metoprolol succinate (TOPROL-XL) 50 MG 24 hr tablet   CAD (coronary artery disease)    The current medical regimen is effective;  continue present plan and medications.       Relevant Medications   clopidogrel (PLAVIX) 75 MG tablet   ezetimibe-simvastatin (VYTORIN) 10-40 MG tablet   lisinopril (PRINIVIL,ZESTRIL) 20 MG tablet   metoprolol succinate (TOPROL-XL) 50 MG 24 hr tablet     Genitourinary   BPH (benign prostatic hyperplasia)    We'll continue tamsulosin when necessary      Relevant Medications   tamsulosin (FLOMAX) 0.4 MG CAPS capsule     Other   Hyperlipemia    The current medical regimen is effective;  continue present plan and medications.       Relevant Medications   ezetimibe-simvastatin (VYTORIN) 10-40 MG tablet   lisinopril (PRINIVIL,ZESTRIL) 20 MG tablet   metoprolol succinate (TOPROL-XL) 50 MG 24 hr tablet   Gout    The current medical regimen is effective;  continue present plan and medications.       Relevant Medications   allopurinol (ZYLOPRIM) 100 MG tablet   Melanoma ([place])    Discuss importance of lifelong surveillance patient will go back to dermatology for reevaluation at [facility] dermatology       Other Visit Diagnoses    Routine screening for STI (sexually transmitted infection)    -  Primary    Routine general medical examination at a health care facility        PE (physical exam), annual            Follow up plan: Return in about 6 months (around 05/12/2016) for bmp, lipids, alt,  ast, .

## 2015-11-10 NOTE — Assessment & Plan Note (Signed)
Discuss importance of lifelong surveillance patient will go back to dermatology for reevaluation at [facility] dermatology

## 2015-11-10 NOTE — Assessment & Plan Note (Signed)
The current medical regimen is effective;  continue present plan and medications.  

## 2015-11-10 NOTE — Assessment & Plan Note (Signed)
We'll continue tamsulosin when necessary

## 2015-11-11 LAB — CBC WITH DIFFERENTIAL/PLATELET
BASOS ABS: 0 10*3/uL (ref 0.0–0.2)
BASOS: 0 %
EOS (ABSOLUTE): 0.1 10*3/uL (ref 0.0–0.4)
EOS: 2 %
HEMOGLOBIN: 12.7 g/dL (ref 12.6–17.7)
Hematocrit: 37.5 % (ref 37.5–51.0)
IMMATURE GRANS (ABS): 0 10*3/uL (ref 0.0–0.1)
Immature Granulocytes: 1 %
LYMPHS ABS: 1.9 10*3/uL (ref 0.7–3.1)
LYMPHS: 25 %
MCH: 31.4 pg (ref 26.6–33.0)
MCHC: 33.9 g/dL (ref 31.5–35.7)
MCV: 93 fL (ref 79–97)
MONOCYTES: 6 %
Monocytes Absolute: 0.5 10*3/uL (ref 0.1–0.9)
NEUTROS ABS: 4.9 10*3/uL (ref 1.4–7.0)
NEUTROS PCT: 66 %
PLATELETS: 399 10*3/uL — AB (ref 150–379)
RBC: 4.05 x10E6/uL — AB (ref 4.14–5.80)
RDW: 14 % (ref 12.3–15.4)
WBC: 7.4 10*3/uL (ref 3.4–10.8)

## 2015-11-11 LAB — COMPREHENSIVE METABOLIC PANEL
ALBUMIN: 4.2 g/dL (ref 3.6–4.8)
ALK PHOS: 68 IU/L (ref 39–117)
ALT: 39 IU/L (ref 0–44)
AST: 24 IU/L (ref 0–40)
Albumin/Globulin Ratio: 1.5 (ref 1.2–2.2)
BUN / CREAT RATIO: 16 (ref 10–22)
BUN: 18 mg/dL (ref 8–27)
Bilirubin Total: 0.4 mg/dL (ref 0.0–1.2)
CO2: 24 mmol/L (ref 18–29)
CREATININE: 1.16 mg/dL (ref 0.76–1.27)
Calcium: 9.7 mg/dL (ref 8.6–10.2)
Chloride: 98 mmol/L (ref 96–106)
GFR calc Af Amer: 75 mL/min/{1.73_m2} (ref >59)
GFR, EST NON AFRICAN AMERICAN: 65 mL/min/{1.73_m2} (ref >59)
GLOBULIN, TOTAL: 2.8 g/dL (ref 1.5–4.5)
GLUCOSE: 97 mg/dL (ref 65–99)
Potassium: 4.9 mmol/L (ref 3.5–5.2)
SODIUM: 138 mmol/L (ref 134–144)
TOTAL PROTEIN: 7 g/dL (ref 6.0–8.5)

## 2015-11-11 LAB — PSA: Prostate Specific Ag, Serum: 23.4 ng/mL — ABNORMAL HIGH (ref 0.0–4.0)

## 2015-11-11 LAB — LIPID PANEL
CHOL/HDL RATIO: 3.8 ratio (ref 0.0–5.0)
CHOLESTEROL TOTAL: 158 mg/dL (ref 100–199)
HDL: 42 mg/dL (ref >39)
LDL CALC: 89 mg/dL (ref 0–99)
Triglycerides: 133 mg/dL (ref 0–149)
VLDL CHOLESTEROL CAL: 27 mg/dL (ref 5–40)

## 2015-11-11 LAB — HEPATITIS C ANTIBODY

## 2015-11-11 LAB — URIC ACID: URIC ACID: 6 mg/dL (ref 3.7–8.6)

## 2015-11-11 LAB — TSH: TSH: 1.61 u[IU]/mL (ref 0.450–4.500)

## 2015-11-14 ENCOUNTER — Telehealth: Payer: Self-pay | Admitting: Family Medicine

## 2015-11-14 NOTE — Telephone Encounter (Signed)
Phone call Discussed with patient elevated PSA patient's been established treatment for prostatitis is feeling better. In retrospect PSA should not of been done  we will repeat PSA in 2-3 months.

## 2015-11-14 NOTE — Telephone Encounter (Signed)
-----   Message from Wynn Maudlin, [place] sent at 11/14/2015  5:04 PM EDT ----- labs

## 2015-12-13 ENCOUNTER — Other Ambulatory Visit: Payer: Self-pay

## 2015-12-13 ENCOUNTER — Telehealth: Payer: Self-pay | Admitting: Family Medicine

## 2015-12-13 DIAGNOSIS — R972 Elevated prostate specific antigen [PSA]: Secondary | ICD-10-CM

## 2015-12-13 NOTE — Telephone Encounter (Signed)
Lab appt scheduled for 01/31/16 as pt will be out of town May 20 - June 3rd, 2017. Thanks.

## 2015-12-13 NOTE — Telephone Encounter (Signed)
Pt's wife called stated pt was supposed to come back after his CPE appt and have his labs drawn again after he finished his medication from an infection, something to do with his prostate level. Please advise. Thanks.

## 2015-12-13 NOTE — Telephone Encounter (Signed)
Per note, patient should come back in for PSA 2-5mo (from last visit) He should come in for lab only  late May or June

## 2016-01-02 ENCOUNTER — Telehealth: Payer: Self-pay | Admitting: Family Medicine

## 2016-01-02 NOTE — Telephone Encounter (Signed)
Ok for labs? 

## 2016-01-02 NOTE — Telephone Encounter (Signed)
Pt's wife called would like a call back from [place] concerning the pt. No further information provided. Thanks.

## 2016-01-02 NOTE — Telephone Encounter (Signed)
Patient's wife really wants him to come in before June and have PSA rechecked (very worried because of recent friend and family bad news)

## 2016-01-02 NOTE — Telephone Encounter (Signed)
Patient will come in for PSA, order is in

## 2016-01-03 ENCOUNTER — Other Ambulatory Visit: Payer: 59

## 2016-01-03 DIAGNOSIS — R972 Elevated prostate specific antigen [PSA]: Secondary | ICD-10-CM

## 2016-01-04 ENCOUNTER — Telehealth: Payer: Self-pay | Admitting: Family Medicine

## 2016-01-04 ENCOUNTER — Encounter: Payer: Self-pay | Admitting: Family Medicine

## 2016-01-04 DIAGNOSIS — R972 Elevated prostate specific antigen [PSA]: Secondary | ICD-10-CM

## 2016-01-04 LAB — PSA: PROSTATE SPECIFIC AG, SERUM: 6.3 ng/mL — AB (ref 0.0–4.0)

## 2016-01-04 NOTE — Telephone Encounter (Signed)
Phone call Discussed with patient elevated PSA a month ago of 23 now down to 6.3 reviewed previous PSAs and has been up and down had a prostate biopsy by Dr. Eliberto Ivory in 2011. Because of back and forth PSAs will refer back to Dr. Eliberto Ivory to further evaluate prostate and PSA elevations.

## 2016-01-31 ENCOUNTER — Other Ambulatory Visit: Payer: 59

## 2016-02-02 ENCOUNTER — Other Ambulatory Visit: Payer: Self-pay | Admitting: Family Medicine

## 2016-05-16 ENCOUNTER — Ambulatory Visit (INDEPENDENT_AMBULATORY_CARE_PROVIDER_SITE_OTHER): Payer: 59 | Admitting: Family Medicine

## 2016-05-16 ENCOUNTER — Encounter: Payer: Self-pay | Admitting: Family Medicine

## 2016-05-16 VITALS — BP 134/74 | HR 60 | Temp 98.0°F | Ht 70.0 in | Wt 202.8 lb

## 2016-05-16 DIAGNOSIS — I2583 Coronary atherosclerosis due to lipid rich plaque: Secondary | ICD-10-CM

## 2016-05-16 DIAGNOSIS — M1000 Idiopathic gout, unspecified site: Secondary | ICD-10-CM | POA: Diagnosis not present

## 2016-05-16 DIAGNOSIS — I2510 Atherosclerotic heart disease of native coronary artery without angina pectoris: Secondary | ICD-10-CM | POA: Diagnosis not present

## 2016-05-16 DIAGNOSIS — E785 Hyperlipidemia, unspecified: Secondary | ICD-10-CM | POA: Diagnosis not present

## 2016-05-16 DIAGNOSIS — N400 Enlarged prostate without lower urinary tract symptoms: Secondary | ICD-10-CM

## 2016-05-16 DIAGNOSIS — I10 Essential (primary) hypertension: Secondary | ICD-10-CM | POA: Diagnosis not present

## 2016-05-16 LAB — LP+ALT+AST PICCOLO, WAIVED
ALT (SGPT) Piccolo, Waived: 33 U/L (ref 10–47)
AST (SGOT) PICCOLO, WAIVED: 35 U/L (ref 11–38)
CHOL/HDL RATIO PICCOLO,WAIVE: 3.6 mg/dL
CHOLESTEROL PICCOLO, WAIVED: 150 mg/dL (ref <200)
HDL Chol Piccolo, Waived: 42 mg/dL — ABNORMAL LOW (ref >59)
LDL Chol Calc Piccolo Waived: 36 mg/dL (ref <100)
Triglycerides Piccolo,Waived: 359 mg/dL — ABNORMAL HIGH (ref <150)
VLDL Chol Calc Piccolo,Waive: 72 mg/dL — ABNORMAL HIGH (ref <30)

## 2016-05-16 MED ORDER — MELOXICAM 15 MG PO TABS: 15.0000 mg | ORAL_TABLET | Freq: Every day | ORAL | 1 refills | Status: DC

## 2016-05-16 NOTE — Assessment & Plan Note (Signed)
The current medical regimen is effective;  continue present plan and medications.  

## 2016-05-16 NOTE — Progress Notes (Signed)
BP 134/74 (BP Location: Left Arm, Patient Position: Sitting, Cuff Size: Normal)   Pulse 60   Temp 98 F (36.7 C)   Ht 5\' 10"  (1.778 m)   Wt 202 lb 12.8 oz (92 kg)   SpO2 96%   BMI 29.10 kg/m    Subjective:    Patient ID: Mark Quinn, male    DOB: 1945/10/11, 70 y.o.   MRN: [id number]  HPI: Mark Quinn is a 69 y.o. male  Chief Complaint  Patient presents with  . Follow-up  . Hyperlipidemia   Patient all in all doing well blood pressures doing good no issues with medications No gout symptoms Cholesterol doing well without issues For BPH getting ready to have surgery next week.   Relevant past medical, surgical, family and social history reviewed and updated as indicated. Interim medical history since our last visit reviewed. Allergies and medications reviewed and updated.  Review of Systems  Constitutional: Negative.   Respiratory: Negative.   Cardiovascular: Negative.     Per HPI unless specifically indicated above     Objective:    BP 134/74 (BP Location: Left Arm, Patient Position: Sitting, Cuff Size: Normal)   Pulse 60   Temp 98 F (36.7 C)   Ht 5\' 10"  (1.778 m)   Wt 202 lb 12.8 oz (92 kg)   SpO2 96%   BMI 29.10 kg/m   Wt Readings from Last 3 Encounters:  05/16/16 202 lb 12.8 oz (92 kg)  11/10/15 196 lb (88.9 kg)  10/28/15 196 lb 3.2 oz (89 kg)    Physical Exam  Constitutional: He is oriented to person, place, and time. He appears well-developed and well-nourished. No distress.  HENT:  Head: Normocephalic and atraumatic.  Right Ear: Hearing normal.  Left Ear: Hearing normal.  Nose: Nose normal.  Eyes: Conjunctivae and lids are normal. Right eye exhibits no discharge. Left eye exhibits no discharge. No scleral icterus.  Cardiovascular: Normal rate, regular rhythm and normal heart sounds.   Pulmonary/Chest: Effort normal and breath sounds normal. No respiratory distress.  Musculoskeletal: Normal range of motion.  Neurological: He is alert  and oriented to person, place, and time.  Skin: Skin is intact. No rash noted.  Psychiatric: He has a normal mood and affect. His speech is normal and behavior is normal. Judgment and thought content normal. Cognition and memory are normal.    Results for orders placed or performed in visit on 01/03/16  PSA  Result Value Ref Range   Prostate Specific Ag, Serum 6.3 (H) 0.0 - 4.0 ng/mL      Assessment & Plan:   Problem List Items Addressed This Visit      Cardiovascular and Mediastinum   Essential hypertension, benign    The current medical regimen is effective;  continue present plan and medications.       CAD (coronary artery disease)    The current medical regimen is effective;  continue present plan and medications.         Genitourinary   BPH (benign prostatic hyperplasia)    BPH surgery coming up next week      Relevant Medications   finasteride (PROSCAR) 5 MG tablet     Other   Hyperlipemia    The current medical regimen is effective;  continue present plan and medications.       Gout    The current medical regimen is effective;  continue present plan and medications.        Other  Visit Diagnoses    Hyperlipidemia    -  Primary   Relevant Orders   Basic metabolic panel   LP+ALT+AST Piccolo, Waived       Follow up plan: Return in about 6 months (around 11/13/2016) for Physical Exam.

## 2016-05-16 NOTE — Addendum Note (Signed)
Addended byGolden Pop on: 05/16/2016 08:57 AM   Modules accepted: Orders

## 2016-05-16 NOTE — Assessment & Plan Note (Signed)
BPH surgery coming up next week

## 2016-05-17 ENCOUNTER — Encounter: Payer: Self-pay | Admitting: Family Medicine

## 2016-05-17 LAB — BASIC METABOLIC PANEL
BUN / CREAT RATIO: 22 (ref 10–24)
BUN: 20 mg/dL (ref 8–27)
CHLORIDE: 103 mmol/L (ref 96–106)
CO2: 24 mmol/L (ref 18–29)
Calcium: 9.4 mg/dL (ref 8.6–10.2)
Creatinine, Ser: 0.9 mg/dL (ref 0.76–1.27)
GFR calc non Af Amer: 88 mL/min/{1.73_m2} (ref >59)
GFR, EST AFRICAN AMERICAN: 102 mL/min/{1.73_m2} (ref >59)
GLUCOSE: 102 mg/dL — AB (ref 65–99)
POTASSIUM: 4.3 mmol/L (ref 3.5–5.2)
SODIUM: 141 mmol/L (ref 134–144)

## 2016-08-29 ENCOUNTER — Encounter: Payer: Self-pay | Admitting: Family Medicine

## 2016-08-29 ENCOUNTER — Telehealth: Payer: Self-pay | Admitting: Family Medicine

## 2016-08-29 NOTE — Telephone Encounter (Signed)
Pt would like to have Rx for hyrdocodone-chlorphen sent to [facility].

## 2016-08-29 NOTE — Telephone Encounter (Signed)
Please see message from front desk.

## 2016-08-30 NOTE — Telephone Encounter (Signed)
We don't call that in without patient being seen. He can get something similar OTC at [facility] if he asks the pharmacist.

## 2016-08-30 NOTE — Telephone Encounter (Signed)
Message relayed to patient. Verbalized understanding and denied questions.   

## 2016-10-11 ENCOUNTER — Ambulatory Visit (INDEPENDENT_AMBULATORY_CARE_PROVIDER_SITE_OTHER): Payer: 59 | Admitting: Family Medicine

## 2016-10-11 ENCOUNTER — Ambulatory Visit: Payer: 59 | Admitting: Family Medicine

## 2016-10-11 ENCOUNTER — Encounter: Payer: Self-pay | Admitting: Family Medicine

## 2016-10-11 VITALS — BP 138/83 | HR 60 | Temp 98.0°F | Wt 197.0 lb

## 2016-10-11 DIAGNOSIS — J069 Acute upper respiratory infection, unspecified: Secondary | ICD-10-CM

## 2016-10-11 LAB — VERITOR FLU A/B WAIVED
INFLUENZA B: NEGATIVE
Influenza A: NEGATIVE

## 2016-10-11 MED ORDER — AZITHROMYCIN 250 MG PO TABS: ORAL_TABLET | ORAL | 0 refills | Status: DC

## 2016-10-11 MED ORDER — HYDROCOD POLST-CPM POLST ER 10-8 MG/5ML PO SUER: 5.0000 mL | Freq: Two times a day (BID) | ORAL | 0 refills | Status: DC | PRN

## 2016-10-11 NOTE — Progress Notes (Signed)
   BP 138/83   Pulse 60   Temp 98 F (36.7 C)   Wt 197 lb (89.4 kg)   SpO2 96%   BMI 28.27 kg/m    Subjective:    Patient ID: Mark Quinn, male    DOB: [birth year], 71 y.o.   MRN: [id number]  HPI: Mark Quinn is a 71 y.o. male  Chief Complaint  Patient presents with  . URI    x 3 days. h/a, cough, runny nose, head congestion, some body aches, scratchy throat. No chest congestion, no known fever.   Patient presents with 3 weeks of congestion and mild cough. Sore throat, sinus drainage, cough, dull headache significantly worsening the past day or two. Denies fever, chills, sweats, aches. Taking mucinex with minimal relief. Lots of sick contacts at work, and grandson had the flu last week.   Relevant past medical, surgical, family and social history reviewed and updated as indicated. Interim medical history since our last visit reviewed. Allergies and medications reviewed and updated.  Review of Systems  Constitutional: Negative.   HENT: Positive for congestion, ear pain, sinus pressure and sore throat.   Eyes: Negative.   Respiratory: Positive for cough.   Cardiovascular: Negative.   Gastrointestinal: Negative.   Genitourinary: Negative.   Musculoskeletal: Negative.   Skin: Negative.   Neurological: Positive for headaches.  Psychiatric/Behavioral: Negative.     Per HPI unless specifically indicated above     Objective:    BP 138/83   Pulse 60   Temp 98 F (36.7 C)   Wt 197 lb (89.4 kg)   SpO2 96%   BMI 28.27 kg/m   Wt Readings from Last 3 Encounters:  10/11/16 197 lb (89.4 kg)  05/16/16 202 lb 12.8 oz (92 kg)  11/10/15 196 lb (88.9 kg)    Physical Exam  Constitutional: He is oriented to person, place, and time. He appears well-developed and well-nourished.  HENT:  Head: Atraumatic.  Mouth/Throat: No oropharyngeal exudate.  Nasal mucosa erythematous, thick discharge present B/l TMs injected   Eyes: Conjunctivae are normal. Pupils are equal, round,  and reactive to light. No scleral icterus.  Neck: Normal range of motion. Neck supple.  Cardiovascular: Normal rate and normal heart sounds.   Pulmonary/Chest: Effort normal and breath sounds normal. No respiratory distress.  Musculoskeletal: Normal range of motion.  Lymphadenopathy:    He has no cervical adenopathy.  Neurological: He is alert and oriented to person, place, and time.  Skin: Skin is warm and dry.  Psychiatric: He has a normal mood and affect. His behavior is normal.  Nursing note and vitals reviewed.     Assessment & Plan:   Problem List Items Addressed This Visit    None    Visit Diagnoses    Upper respiratory tract infection, unspecified type    -  Primary   Given duration and acutely worsening sxs, will treat with azithromycin and tussionex. Precautions given. Supportive care discussed.    Relevant Medications   azithromycin (ZITHROMAX) 250 MG tablet   Other Relevant Orders   Influenza A & B (STAT)       Follow up plan: Return if symptoms worsen or fail to improve.

## 2016-10-11 NOTE — Patient Instructions (Signed)
Follow up as needed

## 2016-11-12 ENCOUNTER — Encounter: Payer: Self-pay | Admitting: Family Medicine

## 2016-11-12 ENCOUNTER — Ambulatory Visit (INDEPENDENT_AMBULATORY_CARE_PROVIDER_SITE_OTHER): Payer: 59 | Admitting: Family Medicine

## 2016-11-12 VITALS — BP 150/88 | HR 66 | Ht 70.47 in | Wt 202.0 lb

## 2016-11-12 DIAGNOSIS — N400 Enlarged prostate without lower urinary tract symptoms: Secondary | ICD-10-CM

## 2016-11-12 DIAGNOSIS — E785 Hyperlipidemia, unspecified: Secondary | ICD-10-CM

## 2016-11-12 DIAGNOSIS — I2583 Coronary atherosclerosis due to lipid rich plaque: Secondary | ICD-10-CM | POA: Diagnosis not present

## 2016-11-12 DIAGNOSIS — M1000 Idiopathic gout, unspecified site: Secondary | ICD-10-CM | POA: Diagnosis not present

## 2016-11-12 DIAGNOSIS — I2510 Atherosclerotic heart disease of native coronary artery without angina pectoris: Secondary | ICD-10-CM

## 2016-11-12 DIAGNOSIS — I10 Essential (primary) hypertension: Secondary | ICD-10-CM

## 2016-11-12 DIAGNOSIS — Z1329 Encounter for screening for other suspected endocrine disorder: Secondary | ICD-10-CM | POA: Diagnosis not present

## 2016-11-12 DIAGNOSIS — Z0000 Encounter for general adult medical examination without abnormal findings: Secondary | ICD-10-CM

## 2016-11-12 LAB — URINALYSIS, ROUTINE W REFLEX MICROSCOPIC
BILIRUBIN UA: NEGATIVE
Glucose, UA: NEGATIVE
Ketones, UA: NEGATIVE
LEUKOCYTES UA: NEGATIVE
Nitrite, UA: NEGATIVE
PH UA: 6 (ref 5.0–7.5)
PROTEIN UA: NEGATIVE
RBC, UA: NEGATIVE
Specific Gravity, UA: <1.005 — ABNORMAL LOW (ref 1.005–1.030)
Urobilinogen, Ur: 0.2 mg/dL (ref 0.2–1.0)

## 2016-11-12 LAB — MICROSCOPIC EXAMINATION
BACTERIA UA: NONE SEEN
RBC, UA: NONE SEEN /hpf (ref 0–?)
WBC, UA: NONE SEEN /hpf (ref 0–?)

## 2016-11-12 MED ORDER — LISINOPRIL 20 MG PO TABS: 20.0000 mg | ORAL_TABLET | Freq: Every day | ORAL | 4 refills | Status: DC

## 2016-11-12 MED ORDER — MELOXICAM 15 MG PO TABS: 15.0000 mg | ORAL_TABLET | Freq: Every day | ORAL | 1 refills | Status: DC

## 2016-11-12 MED ORDER — ALLOPURINOL 100 MG PO TABS: 100.0000 mg | ORAL_TABLET | Freq: Every day | ORAL | 4 refills | Status: DC

## 2016-11-12 MED ORDER — RABEPRAZOLE SODIUM 20 MG PO TBEC: 20.0000 mg | DELAYED_RELEASE_TABLET | Freq: Every day | ORAL | 4 refills | Status: DC

## 2016-11-12 MED ORDER — EZETIMIBE-SIMVASTATIN 10-40 MG PO TABS: 1.0000 | ORAL_TABLET | Freq: Every day | ORAL | 4 refills | Status: DC

## 2016-11-12 MED ORDER — METOPROLOL SUCCINATE ER 50 MG PO TB24: 50.0000 mg | ORAL_TABLET | Freq: Every day | ORAL | 4 refills | Status: DC

## 2016-11-12 MED ORDER — CLOPIDOGREL BISULFATE 75 MG PO TABS: 75.0000 mg | ORAL_TABLET | Freq: Every day | ORAL | 4 refills | Status: DC

## 2016-11-12 NOTE — Assessment & Plan Note (Signed)
The current medical regimen is effective;  continue present plan and medications.  

## 2016-11-12 NOTE — Assessment & Plan Note (Signed)
Status post surgery followed by Dr. Eliberto Ivory

## 2016-11-12 NOTE — Assessment & Plan Note (Signed)
Elevated today the patient's with just finishing prednisone. Blood pressure at work and if still up patient will notify us for increasing medication.

## 2016-11-12 NOTE — Progress Notes (Signed)
BP (!) 150/88   Pulse 66   Ht 5' 10.47" (1.79 m)   Wt 202 lb (91.6 kg)   SpO2 98%   BMI 28.60 kg/m    Subjective:    Patient ID: Mark Quinn, male    DOB: [birth year], 71 y.o.   MRN: [id number]  HPI: Mark Quinn is a 71 y.o. male  Chief Complaint  Patient presents with  . Annual Exam  . Back Pain    Threw out back last Friday  Patient in the back is getting better is getting ready to go back to work had's been treated by [facility] with prednisone. Patient's blood pressures consequently up will observe blood pressure. On review patient's blood pressure is been normal taking blood pressure medications faithfully.  Had BPH surgery with Dr. Eliberto Ivory and doing well. No issues BPH followed by Dr. Eliberto Ivory.  Cholesterol doing well with medications no issues. No gout issues taking allopurinol without problems. Relevant past medical, surgical, family and social history reviewed and updated as indicated. Interim medical history since our last visit reviewed. Allergies and medications reviewed and updated.  Review of Systems  Constitutional: Negative.   HENT: Negative.   Eyes: Negative.   Respiratory: Negative.   Cardiovascular: Negative.   Gastrointestinal: Negative.   Endocrine: Negative.   Genitourinary: Negative.   Musculoskeletal: Negative.   Skin: Negative.   Allergic/Immunologic: Negative.   Neurological: Negative.   Hematological: Negative.   Psychiatric/Behavioral: Negative.     Per HPI unless specifically indicated above     Objective:    BP (!) 150/88   Pulse 66   Ht 5' 10.47" (1.79 m)   Wt 202 lb (91.6 kg)   SpO2 98%   BMI 28.60 kg/m   Wt Readings from Last 3 Encounters:  11/12/16 202 lb (91.6 kg)  10/11/16 197 lb (89.4 kg)  05/16/16 202 lb 12.8 oz (92 kg)    Physical Exam  Constitutional: He is oriented to person, place, and time. He appears well-developed and well-nourished.  HENT:  Head: Normocephalic and atraumatic.  Right Ear: External ear  normal.  Left Ear: External ear normal.  Eyes: Conjunctivae and EOM are normal. Pupils are equal, round, and reactive to light.  Neck: Normal range of motion. Neck supple.  Cardiovascular: Normal rate, regular rhythm, normal heart sounds and intact distal pulses.   Pulmonary/Chest: Effort normal and breath sounds normal.  Abdominal: Soft. Bowel sounds are normal. There is no splenomegaly or hepatomegaly.  Genitourinary: Penis normal.  Genitourinary Comments: Done at urology  Musculoskeletal: Normal range of motion.  Neurological: He is alert and oriented to person, place, and time. He has normal reflexes.  Skin: No rash noted. No erythema.  Psychiatric: He has a normal mood and affect. His behavior is normal. Judgment and thought content normal.    Results for orders placed or performed in visit on 10/11/16  Influenza A & B (STAT)  Result Value Ref Range   Influenza A Negative Negative   Influenza B Negative Negative      Assessment & Plan:   Problem List Items Addressed This Visit      Cardiovascular and Mediastinum   Essential hypertension, benign    Elevated today the patient's with just finishing prednisone. Blood pressure at work and if still up patient will notify us for increasing medication.      Relevant Medications   ezetimibe-simvastatin (VYTORIN) 10-40 MG tablet   lisinopril (PRINIVIL,ZESTRIL) 20 MG tablet   metoprolol succinate (TOPROL-XL)  50 MG 24 hr tablet   Other Relevant Orders   Comprehensive metabolic panel   Lipid panel   Urinalysis, Routine w reflex microscopic   CBC with Differential/Platelet   CAD (coronary artery disease)    The current medical regimen is effective;  continue present plan and medications.       Relevant Medications   ezetimibe-simvastatin (VYTORIN) 10-40 MG tablet   lisinopril (PRINIVIL,ZESTRIL) 20 MG tablet   metoprolol succinate (TOPROL-XL) 50 MG 24 hr tablet   clopidogrel (PLAVIX) 75 MG tablet     Genitourinary   BPH  (benign prostatic hyperplasia)    Status post surgery followed by Dr. Eliberto Ivory      Relevant Orders   PSA     Other   Hyperlipemia    The current medical regimen is effective;  continue present plan and medications.       Relevant Medications   ezetimibe-simvastatin (VYTORIN) 10-40 MG tablet   lisinopril (PRINIVIL,ZESTRIL) 20 MG tablet   metoprolol succinate (TOPROL-XL) 50 MG 24 hr tablet   Other Relevant Orders   Comprehensive metabolic panel   Lipid panel   Urinalysis, Routine w reflex microscopic   CBC with Differential/Platelet   Gout    The current medical regimen is effective;  continue present plan and medications.       Relevant Medications   allopurinol (ZYLOPRIM) 100 MG tablet    Other Visit Diagnoses    Annual physical exam    -  Primary   Relevant Orders   Comprehensive metabolic panel   Lipid panel   PSA   TSH   Urinalysis, Routine w reflex microscopic   CBC with Differential/Platelet   Thyroid disorder screen       Relevant Orders   TSH       Follow up plan: Return in about 6 months (around 05/15/2017) for BMP,  Lipids, ALT, AST.

## 2016-11-13 ENCOUNTER — Telehealth: Payer: Self-pay | Admitting: Family Medicine

## 2016-11-13 DIAGNOSIS — D649 Anemia, unspecified: Secondary | ICD-10-CM

## 2016-11-13 LAB — CBC WITH DIFFERENTIAL/PLATELET
BASOS ABS: 0 10*3/uL (ref 0.0–0.2)
Basos: 0 %
EOS (ABSOLUTE): 0.1 10*3/uL (ref 0.0–0.4)
EOS: 1 %
Hematocrit: 36.5 % — ABNORMAL LOW (ref 37.5–51.0)
Hemoglobin: 12.7 g/dL — ABNORMAL LOW (ref 13.0–17.7)
Immature Grans (Abs): 0.1 10*3/uL (ref 0.0–0.1)
Immature Granulocytes: 1 %
LYMPHS: 34 %
Lymphocytes Absolute: 3 10*3/uL (ref 0.7–3.1)
MCH: 31.7 pg (ref 26.6–33.0)
MCHC: 34.8 g/dL (ref 31.5–35.7)
MCV: 91 fL (ref 79–97)
MONOS ABS: 0.7 10*3/uL (ref 0.1–0.9)
Monocytes: 7 %
NEUTROS PCT: 57 %
Neutrophils Absolute: 5 10*3/uL (ref 1.4–7.0)
PLATELETS: 255 10*3/uL (ref 150–379)
RBC: 4.01 x10E6/uL — ABNORMAL LOW (ref 4.14–5.80)
RDW: 13.8 % (ref 12.3–15.4)
WBC: 8.7 10*3/uL (ref 3.4–10.8)

## 2016-11-13 LAB — LIPID PANEL
CHOL/HDL RATIO: 3.8 ratio (ref 0.0–5.0)
Cholesterol, Total: 181 mg/dL (ref 100–199)
HDL: 48 mg/dL (ref >39)
LDL Calculated: 75 mg/dL (ref 0–99)
Triglycerides: 288 mg/dL — ABNORMAL HIGH (ref 0–149)
VLDL CHOLESTEROL CAL: 58 mg/dL — AB (ref 5–40)

## 2016-11-13 LAB — COMPREHENSIVE METABOLIC PANEL
A/G RATIO: 2 (ref 1.2–2.2)
ALBUMIN: 4.3 g/dL (ref 3.6–4.8)
ALT: 41 IU/L (ref 0–44)
AST: 25 IU/L (ref 0–40)
Alkaline Phosphatase: 56 IU/L (ref 39–117)
BUN / CREAT RATIO: 21 (ref 10–24)
BUN: 21 mg/dL (ref 8–27)
CALCIUM: 9.4 mg/dL (ref 8.6–10.2)
CHLORIDE: 99 mmol/L (ref 96–106)
CO2: 27 mmol/L (ref 18–29)
Creatinine, Ser: 1.01 mg/dL (ref 0.76–1.27)
GFR, EST AFRICAN AMERICAN: 88 mL/min/{1.73_m2} (ref >59)
GFR, EST NON AFRICAN AMERICAN: 76 mL/min/{1.73_m2} (ref >59)
Globulin, Total: 2.1 g/dL (ref 1.5–4.5)
Glucose: 92 mg/dL (ref 65–99)
POTASSIUM: 4 mmol/L (ref 3.5–5.2)
Sodium: 142 mmol/L (ref 134–144)
TOTAL PROTEIN: 6.4 g/dL (ref 6.0–8.5)

## 2016-11-13 LAB — TSH: TSH: 2.3 u[IU]/mL (ref 0.450–4.500)

## 2016-11-13 LAB — PSA: Prostate Specific Ag, Serum: 1.4 ng/mL (ref 0.0–4.0)

## 2016-11-13 NOTE — Telephone Encounter (Signed)
Phone call Discussed with patient slight decrease in hemoglobin and hematocrit. Patient of course with no symptoms with slight decline no noticed blood or bleeding did have prostate surgery and is doing well with that. Maybe lost some blood there. Patient will take a vitamin with iron recheck CBC 1 month.

## 2016-11-13 NOTE — Telephone Encounter (Addendum)
Patients wife called concerned about patients results. She said the patient tried to explain it to her but he had hard time explaining things to her.  She would like for someone to call her to explain what is going on.  Thanks

## 2016-11-14 NOTE — Telephone Encounter (Signed)
Per previous telephone encounter by Dr. Jeananne Rama,   "Discussed with patient slight decrease in hemoglobin and hematocrit. Patient of course with no symptoms with slight decline no noticed blood or bleeding did have prostate surgery and is doing well with that. Maybe lost some blood there. Patient will take a vitamin with iron recheck CBC 1 month."

## 2016-11-14 NOTE — Telephone Encounter (Signed)
Left message on machine for pt to return call to the office.  

## 2016-11-19 NOTE — Telephone Encounter (Signed)
Left message on machine for pt to return call to the office. Will close encounter until pt/wife calls back.

## 2016-12-04 ENCOUNTER — Telehealth: Payer: Self-pay | Admitting: Family Medicine

## 2016-12-04 NOTE — Telephone Encounter (Signed)
Information relayed to wife.

## 2016-12-04 NOTE — Telephone Encounter (Signed)
Will come in next week for CBC

## 2017-02-03 ENCOUNTER — Other Ambulatory Visit: Payer: Self-pay | Admitting: Family Medicine

## 2017-02-04 NOTE — Telephone Encounter (Signed)
Last (acute) OV: 10/11/16 Last routine OV: 11/12/16 Next OV: 05/20/17

## 2017-02-25 ENCOUNTER — Other Ambulatory Visit: Payer: Self-pay | Admitting: Family Medicine

## 2017-05-20 ENCOUNTER — Ambulatory Visit (INDEPENDENT_AMBULATORY_CARE_PROVIDER_SITE_OTHER): Payer: 59 | Admitting: Family Medicine

## 2017-05-20 ENCOUNTER — Encounter: Payer: Self-pay | Admitting: Family Medicine

## 2017-05-20 ENCOUNTER — Other Ambulatory Visit: Payer: Self-pay | Admitting: Family Medicine

## 2017-05-20 VITALS — BP 133/87 | HR 65 | Wt 196.0 lb

## 2017-05-20 DIAGNOSIS — E785 Hyperlipidemia, unspecified: Secondary | ICD-10-CM | POA: Diagnosis not present

## 2017-05-20 DIAGNOSIS — I2583 Coronary atherosclerosis due to lipid rich plaque: Secondary | ICD-10-CM | POA: Diagnosis not present

## 2017-05-20 DIAGNOSIS — D649 Anemia, unspecified: Secondary | ICD-10-CM | POA: Diagnosis not present

## 2017-05-20 DIAGNOSIS — M1000 Idiopathic gout, unspecified site: Secondary | ICD-10-CM

## 2017-05-20 DIAGNOSIS — I2510 Atherosclerotic heart disease of native coronary artery without angina pectoris: Secondary | ICD-10-CM

## 2017-05-20 DIAGNOSIS — I10 Essential (primary) hypertension: Secondary | ICD-10-CM

## 2017-05-20 LAB — LP+ALT+AST PICCOLO, WAIVED
ALT (SGPT) PICCOLO, WAIVED: 35 U/L (ref 10–47)
AST (SGOT) Piccolo, Waived: 36 U/L (ref 11–38)
CHOLESTEROL PICCOLO, WAIVED: 166 mg/dL (ref <200)
Chol/HDL Ratio Piccolo,Waive: 3.9 mg/dL
HDL Chol Piccolo, Waived: 42 mg/dL — ABNORMAL LOW (ref >59)
LDL CHOL CALC PICCOLO WAIVED: 80 mg/dL (ref <100)
Triglycerides Piccolo,Waived: 222 mg/dL — ABNORMAL HIGH (ref <150)
VLDL Chol Calc Piccolo,Waive: 44 mg/dL — ABNORMAL HIGH (ref <30)

## 2017-05-20 MED ORDER — MELOXICAM 15 MG PO TABS: 15.0000 mg | ORAL_TABLET | Freq: Every day | ORAL | 1 refills | Status: DC

## 2017-05-20 NOTE — Progress Notes (Signed)
BP 133/87   Pulse 65   Wt 196 lb (88.9 kg)   SpO2 98%   BMI 27.75 kg/m    Subjective:    Patient ID: Mark Quinn, male    DOB: Apr 11, 1946, 71 y.o.   MRN: [id number]  HPI: Mark Quinn is a 71 y.o. male  Chief Complaint  Patient presents with  . Hyperlipidemia  Patient all in all doing well no complaints from medication taken faithfully. Never got back for repeat CBC so we will check today for question of nonspecific anemia. Had a day of being on his feet pretty much all day with some bunion pain discomfort will check uric acid for evaluation of possible gout type symptoms. Blood pressure doing well without problems.   Relevant past medical, surgical, family and social history reviewed and updated as indicated. Interim medical history since our last visit reviewed. Allergies and medications reviewed and updated.  Review of Systems  Constitutional: Negative.   Respiratory: Negative.   Cardiovascular: Negative.     Per HPI unless specifically indicated above     Objective:    BP 133/87   Pulse 65   Wt 196 lb (88.9 kg)   SpO2 98%   BMI 27.75 kg/m   Wt Readings from Last 3 Encounters:  05/20/17 196 lb (88.9 kg)  11/12/16 202 lb (91.6 kg)  10/11/16 197 lb (89.4 kg)    Physical Exam  Constitutional: He is oriented to person, place, and time. He appears well-developed and well-nourished.  HENT:  Head: Normocephalic and atraumatic.  Eyes: Conjunctivae and EOM are normal.  Neck: Normal range of motion.  Cardiovascular: Normal rate, regular rhythm and normal heart sounds.   Pulmonary/Chest: Effort normal and breath sounds normal.  Musculoskeletal: Normal range of motion.  Neurological: He is alert and oriented to person, place, and time.  Skin: No erythema.  Psychiatric: He has a normal mood and affect. His behavior is normal. Judgment and thought content normal.    Results for orders placed or performed in visit on 11/12/16  Microscopic Examination    Result Value Ref Range   WBC, UA None seen 0 - 5 /hpf   RBC, UA None seen 0 - 2 /hpf   Epithelial Cells (non renal) 0-10 0 - 10 /hpf   Bacteria, UA None seen None seen/Few  Comprehensive metabolic panel  Result Value Ref Range   Glucose 92 65 - 99 mg/dL   BUN 21 8 - 27 mg/dL   Creatinine, Ser 1.01 0.76 - 1.27 mg/dL   GFR calc non Af Amer 76 >59 mL/min/1.73   GFR calc Af Amer 88 >59 mL/min/1.73   BUN/Creatinine Ratio 21 10 - 24   Sodium 142 134 - 144 mmol/L   Potassium 4.0 3.5 - 5.2 mmol/L   Chloride 99 96 - 106 mmol/L   CO2 27 18 - 29 mmol/L   Calcium 9.4 8.6 - 10.2 mg/dL   Total Protein 6.4 6.0 - 8.5 g/dL   Albumin 4.3 3.6 - 4.8 g/dL   Globulin, Total 2.1 1.5 - 4.5 g/dL   Albumin/Globulin Ratio 2.0 1.2 - 2.2   Bilirubin Total <0.2 0.0 - 1.2 mg/dL   Alkaline Phosphatase 56 39 - 117 IU/L   AST 25 0 - 40 IU/L   ALT 41 0 - 44 IU/L  Lipid panel  Result Value Ref Range   Cholesterol, Total 181 100 - 199 mg/dL   Triglycerides 288 (H) 0 - 149 mg/dL   HDL 48 >  39 mg/dL   VLDL Cholesterol Cal 58 (H) 5 - 40 mg/dL   LDL Calculated 75 0 - 99 mg/dL   Chol/HDL Ratio 3.8 0.0 - 5.0 ratio units  PSA  Result Value Ref Range   Prostate Specific Ag, Serum 1.4 0.0 - 4.0 ng/mL  TSH  Result Value Ref Range   TSH 2.300 0.450 - 4.500 uIU/mL  Urinalysis, Routine w reflex microscopic  Result Value Ref Range   Specific Gravity, UA <1.005 (L) 1.005 - 1.030   pH, UA 6.0 5.0 - 7.5   Color, UA Yellow Yellow   Appearance Ur Clear Clear   Leukocytes, UA Negative Negative   Protein, UA Negative Negative/Trace   Glucose, UA Negative Negative   Ketones, UA Negative Negative   RBC, UA Negative Negative   Bilirubin, UA Negative Negative   Urobilinogen, Ur 0.2 0.2 - 1.0 mg/dL   Nitrite, UA Negative Negative   Microscopic Examination See below:   CBC with Differential/Platelet  Result Value Ref Range   WBC 8.7 3.4 - 10.8 x10E3/uL   RBC 4.01 (L) 4.14 - 5.80 x10E6/uL   Hemoglobin 12.7 (L) 13.0 -  17.7 g/dL   Hematocrit 36.5 (L) 37.5 - 51.0 %   MCV 91 79 - 97 fL   MCH 31.7 26.6 - 33.0 pg   MCHC 34.8 31.5 - 35.7 g/dL   RDW 13.8 12.3 - 15.4 %   Platelets 255 150 - 379 x10E3/uL   Neutrophils 57 Not Estab. %   Lymphs 34 Not Estab. %   Monocytes 7 Not Estab. %   Eos 1 Not Estab. %   Basos 0 Not Estab. %   Neutrophils Absolute 5.0 1.4 - 7.0 x10E3/uL   Lymphocytes Absolute 3.0 0.7 - 3.1 x10E3/uL   Monocytes Absolute 0.7 0.1 - 0.9 x10E3/uL   EOS (ABSOLUTE) 0.1 0.0 - 0.4 x10E3/uL   Basophils Absolute 0.0 0.0 - 0.2 x10E3/uL   Immature Granulocytes 1 Not Estab. %   Immature Grans (Abs) 0.1 0.0 - 0.1 x10E3/uL      Assessment & Plan:   Problem List Items Addressed This Visit      Cardiovascular and Mediastinum   Essential hypertension, benign - Primary    The current medical regimen is effective;  continue present plan and medications.       Relevant Orders   Basic metabolic panel   CAD (coronary artery disease)    The current medical regimen is effective;  continue present plan and medications.         Other   Hyperlipemia    The current medical regimen is effective;  continue present plan and medications.       Relevant Orders   LP+ALT+AST Piccolo, Waived   Gout    Having some symptoms will check uric acid today      Relevant Orders   Uric acid   Anemia, unspecified    Will check CBC      Relevant Orders   CBC with Differential/Platelet       Follow up plan: Return for Physical Exam   March.

## 2017-05-20 NOTE — Assessment & Plan Note (Signed)
The current medical regimen is effective;  continue present plan and medications.  

## 2017-05-20 NOTE — Assessment & Plan Note (Signed)
Having some symptoms will check uric acid today

## 2017-05-20 NOTE — Assessment & Plan Note (Signed)
Will check CBC 

## 2017-05-21 ENCOUNTER — Encounter: Payer: Self-pay | Admitting: Family Medicine

## 2017-05-21 LAB — CBC WITH DIFFERENTIAL/PLATELET
BASOS: 0 %
Basophils Absolute: 0 10*3/uL (ref 0.0–0.2)
EOS (ABSOLUTE): 0.1 10*3/uL (ref 0.0–0.4)
EOS: 2 %
HEMATOCRIT: 38.2 % (ref 37.5–51.0)
Hemoglobin: 13 g/dL (ref 13.0–17.7)
IMMATURE GRANULOCYTES: 0 %
Immature Grans (Abs): 0 10*3/uL (ref 0.0–0.1)
LYMPHS ABS: 2.1 10*3/uL (ref 0.7–3.1)
Lymphs: 30 %
MCH: 32.1 pg (ref 26.6–33.0)
MCHC: 34 g/dL (ref 31.5–35.7)
MCV: 94 fL (ref 79–97)
Monocytes Absolute: 0.5 10*3/uL (ref 0.1–0.9)
Monocytes: 7 %
NEUTROS ABS: 4.4 10*3/uL (ref 1.4–7.0)
Neutrophils: 61 %
Platelets: 257 10*3/uL (ref 150–379)
RBC: 4.05 x10E6/uL — ABNORMAL LOW (ref 4.14–5.80)
RDW: 13.6 % (ref 12.3–15.4)
WBC: 7.2 10*3/uL (ref 3.4–10.8)

## 2017-05-21 LAB — BASIC METABOLIC PANEL
BUN / CREAT RATIO: 19 (ref 10–24)
BUN: 21 mg/dL (ref 8–27)
CO2: 23 mmol/L (ref 20–29)
CREATININE: 1.12 mg/dL (ref 0.76–1.27)
Calcium: 9.9 mg/dL (ref 8.6–10.2)
Chloride: 102 mmol/L (ref 96–106)
GFR calc Af Amer: 78 mL/min/{1.73_m2} (ref >59)
GFR, EST NON AFRICAN AMERICAN: 67 mL/min/{1.73_m2} (ref >59)
Glucose: 84 mg/dL (ref 65–99)
Potassium: 4.6 mmol/L (ref 3.5–5.2)
Sodium: 139 mmol/L (ref 134–144)

## 2017-05-21 LAB — URIC ACID: Uric Acid: 6.4 mg/dL (ref 3.7–8.6)

## 2017-06-20 ENCOUNTER — Encounter: Payer: Self-pay | Admitting: Family Medicine

## 2017-06-20 MED ORDER — FINASTERIDE 5 MG PO TABS: 5.0000 mg | ORAL_TABLET | Freq: Every day | ORAL | 4 refills | Status: DC

## 2017-11-14 ENCOUNTER — Other Ambulatory Visit: Payer: Self-pay | Admitting: Family Medicine

## 2017-11-14 DIAGNOSIS — I10 Essential (primary) hypertension: Secondary | ICD-10-CM

## 2017-11-14 NOTE — Telephone Encounter (Signed)
lisinopril refill Last OV: 05/20/17 Last Refill:11/12/16 Pharmacy:[facility] [id number] PCP: Dr Jeananne Rama

## 2017-11-19 ENCOUNTER — Encounter: Payer: 59 | Admitting: Family Medicine

## 2017-11-21 ENCOUNTER — Ambulatory Visit: Payer: 59 | Admitting: Family Medicine

## 2017-11-21 ENCOUNTER — Encounter: Payer: Self-pay | Admitting: Family Medicine

## 2017-11-21 VITALS — BP 180/77 | HR 61 | Ht 70.47 in | Wt 196.6 lb

## 2017-11-21 DIAGNOSIS — R972 Elevated prostate specific antigen [PSA]: Secondary | ICD-10-CM

## 2017-11-21 DIAGNOSIS — M1000 Idiopathic gout, unspecified site: Secondary | ICD-10-CM

## 2017-11-21 DIAGNOSIS — I358 Other nonrheumatic aortic valve disorders: Secondary | ICD-10-CM | POA: Insufficient documentation

## 2017-11-21 DIAGNOSIS — I2510 Atherosclerotic heart disease of native coronary artery without angina pectoris: Secondary | ICD-10-CM

## 2017-11-21 DIAGNOSIS — I2583 Coronary atherosclerosis due to lipid rich plaque: Secondary | ICD-10-CM | POA: Diagnosis not present

## 2017-11-21 DIAGNOSIS — N400 Enlarged prostate without lower urinary tract symptoms: Secondary | ICD-10-CM | POA: Diagnosis not present

## 2017-11-21 DIAGNOSIS — I10 Essential (primary) hypertension: Secondary | ICD-10-CM

## 2017-11-21 DIAGNOSIS — R011 Cardiac murmur, unspecified: Secondary | ICD-10-CM

## 2017-11-21 DIAGNOSIS — Z1329 Encounter for screening for other suspected endocrine disorder: Secondary | ICD-10-CM | POA: Diagnosis not present

## 2017-11-21 DIAGNOSIS — E785 Hyperlipidemia, unspecified: Secondary | ICD-10-CM | POA: Diagnosis not present

## 2017-11-21 DIAGNOSIS — Z0001 Encounter for general adult medical examination with abnormal findings: Secondary | ICD-10-CM | POA: Diagnosis not present

## 2017-11-21 LAB — URINALYSIS, ROUTINE W REFLEX MICROSCOPIC
Bilirubin, UA: NEGATIVE
GLUCOSE, UA: NEGATIVE
KETONES UA: NEGATIVE
Leukocytes, UA: NEGATIVE
NITRITE UA: NEGATIVE
Protein, UA: NEGATIVE
SPEC GRAV UA: 1.02 (ref 1.005–1.030)
Urobilinogen, Ur: 0.2 mg/dL (ref 0.2–1.0)
pH, UA: 6 (ref 5.0–7.5)

## 2017-11-21 LAB — MICROSCOPIC EXAMINATION: Bacteria, UA: NONE SEEN

## 2017-11-21 MED ORDER — RABEPRAZOLE SODIUM 20 MG PO TBEC: 20.0000 mg | DELAYED_RELEASE_TABLET | Freq: Every day | ORAL | 4 refills | Status: DC

## 2017-11-21 MED ORDER — MELOXICAM 15 MG PO TABS: 15.0000 mg | ORAL_TABLET | Freq: Every day | ORAL | 1 refills | Status: DC

## 2017-11-21 MED ORDER — METOPROLOL SUCCINATE ER 50 MG PO TB24: 50.0000 mg | ORAL_TABLET | Freq: Every day | ORAL | 4 refills | Status: DC

## 2017-11-21 MED ORDER — EZETIMIBE-SIMVASTATIN 10-40 MG PO TABS: 1.0000 | ORAL_TABLET | Freq: Every day | ORAL | 4 refills | Status: DC

## 2017-11-21 MED ORDER — CLOPIDOGREL BISULFATE 75 MG PO TABS: 75.0000 mg | ORAL_TABLET | Freq: Every day | ORAL | 4 refills | Status: DC

## 2017-11-21 MED ORDER — FINASTERIDE 5 MG PO TABS: 5.0000 mg | ORAL_TABLET | Freq: Every day | ORAL | 4 refills | Status: DC

## 2017-11-21 MED ORDER — LISINOPRIL 20 MG PO TABS: 20.0000 mg | ORAL_TABLET | Freq: Every day | ORAL | 4 refills | Status: DC

## 2017-11-21 MED ORDER — ALLOPURINOL 100 MG PO TABS: 100.0000 mg | ORAL_TABLET | Freq: Every day | ORAL | 4 refills | Status: DC

## 2017-11-21 NOTE — Assessment & Plan Note (Signed)
Discuss elevated blood pressure most likely from dietary indiscretion patient will do better with blood pressure diet.  If blood pressure not getting better will need to reevaluate blood pressure treatment.

## 2017-11-21 NOTE — Assessment & Plan Note (Signed)
The current medical regimen is effective;  continue present plan and medications.  

## 2017-11-21 NOTE — Assessment & Plan Note (Signed)
Will refer to cardiology to further evaluate and characterize and schedule cardiac echo if needed.

## 2017-11-21 NOTE — Assessment & Plan Note (Signed)
The current medical regimen is effective;  continue present plan and medications. a 

## 2017-11-21 NOTE — Progress Notes (Signed)
BP (!) 180/77   Pulse 61   Ht 5' 10.47" (1.79 m)   Wt 196 lb 9.6 oz (89.2 kg)   SpO2 98%   BMI 27.83 kg/m    Subjective:    Patient ID: Mark Quinn, male    DOB: April 16, 1946, 72 y.o.   MRN: [id number]  HPI: Mark Quinn is a 72 y.o. male  Chief Complaint  Patient presents with  . Follow-up   Annual exam  Patient's all in all doing well blood pressure checking at work has been good taking medications faithfully.  No issues or concerns. Blood pressure is up today but on further review patient's had some significant dietary indiscretion over the last several days. Other medications doing well with no complaints or concerns with cholesterol, reflux and prostate. Also no gout symptoms  Relevant past medical, surgical, family and social history reviewed and updated as indicated. Interim medical history since our last visit reviewed. Allergies and medications reviewed and updated.  Review of Systems  Constitutional: Negative.   HENT: Negative.   Eyes: Negative.   Respiratory: Negative.   Cardiovascular: Negative.   Gastrointestinal: Negative.   Endocrine: Negative.   Genitourinary: Negative.   Musculoskeletal: Negative.   Skin: Negative.   Allergic/Immunologic: Negative.   Neurological: Negative.   Hematological: Negative.   Psychiatric/Behavioral: Negative.     Per HPI unless specifically indicated above     Objective:    BP (!) 180/77   Pulse 61   Ht 5' 10.47" (1.79 m)   Wt 196 lb 9.6 oz (89.2 kg)   SpO2 98%   BMI 27.83 kg/m   Wt Readings from Last 3 Encounters:  11/21/17 196 lb 9.6 oz (89.2 kg)  05/20/17 196 lb (88.9 kg)  11/12/16 202 lb (91.6 kg)    Physical Exam  Constitutional: He is oriented to person, place, and time. He appears well-developed and well-nourished.  HENT:  Head: Normocephalic and atraumatic.  Right Ear: External ear normal.  Left Ear: External ear normal.  Eyes: Pupils are equal, round, and reactive to light. Conjunctivae and  EOM are normal.  Neck: Normal range of motion. Neck supple.  Cardiovascular: Normal rate, regular rhythm, normal heart sounds and intact distal pulses.  2/6 systolic murmur heard best at the apex  Pulmonary/Chest: Effort normal and breath sounds normal.  Abdominal: Soft. Bowel sounds are normal. There is no splenomegaly or hepatomegaly.  Genitourinary: Rectum normal, prostate normal and penis normal.  Musculoskeletal: Normal range of motion.  Neurological: He is alert and oriented to person, place, and time. He has normal reflexes.  Skin: No rash noted. No erythema.  Psychiatric: He has a normal mood and affect. His behavior is normal. Judgment and thought content normal.    Results for orders placed or performed in visit on 93/23/55  Basic metabolic panel  Result Value Ref Range   Glucose 84 65 - 99 mg/dL   BUN 21 8 - 27 mg/dL   Creatinine, Ser 1.12 0.76 - 1.27 mg/dL   GFR calc non Af Amer 67 >59 mL/min/1.73   GFR calc Af Amer 78 >59 mL/min/1.73   BUN/Creatinine Ratio 19 10 - 24   Sodium 139 134 - 144 mmol/L   Potassium 4.6 3.5 - 5.2 mmol/L   Chloride 102 96 - 106 mmol/L   CO2 23 20 - 29 mmol/L   Calcium 9.9 8.6 - 10.2 mg/dL  LP+ALT+AST Piccolo, Waived  Result Value Ref Range   ALT (SGPT) Piccolo, Waived 35 10 -  47 U/L   AST (SGOT) Piccolo, Waived 36 11 - 38 U/L   Cholesterol Piccolo, Waived 166 <200 mg/dL   HDL Chol Piccolo, Waived 42 (L) >59 mg/dL   Triglycerides Piccolo,Waived 222 (H) <150 mg/dL   Chol/HDL Ratio Piccolo,Waive 3.9 mg/dL   LDL Chol Calc Piccolo Waived 80 <100 mg/dL   VLDL Chol Calc Piccolo,Waive 44 (H) <30 mg/dL  CBC with Differential/Platelet  Result Value Ref Range   WBC 7.2 3.4 - 10.8 x10E3/uL   RBC 4.05 (L) 4.14 - 5.80 x10E6/uL   Hemoglobin 13.0 13.0 - 17.7 g/dL   Hematocrit 38.2 37.5 - 51.0 %   MCV 94 79 - 97 fL   MCH 32.1 26.6 - 33.0 pg   MCHC 34.0 31.5 - 35.7 g/dL   RDW 13.6 12.3 - 15.4 %   Platelets 257 150 - 379 x10E3/uL   Neutrophils 61 Not  Estab. %   Lymphs 30 Not Estab. %   Monocytes 7 Not Estab. %   Eos 2 Not Estab. %   Basos 0 Not Estab. %   Neutrophils Absolute 4.4 1.4 - 7.0 x10E3/uL   Lymphocytes Absolute 2.1 0.7 - 3.1 x10E3/uL   Monocytes Absolute 0.5 0.1 - 0.9 x10E3/uL   EOS (ABSOLUTE) 0.1 0.0 - 0.4 x10E3/uL   Basophils Absolute 0.0 0.0 - 0.2 x10E3/uL   Immature Granulocytes 0 Not Estab. %   Immature Grans (Abs) 0.0 0.0 - 0.1 x10E3/uL  Uric acid  Result Value Ref Range   Uric Acid 6.4 3.7 - 8.6 mg/dL      Assessment & Plan:   Problem List Items Addressed This Visit      Cardiovascular and Mediastinum   Essential hypertension, benign - Primary    Discuss elevated blood pressure most likely from dietary indiscretion patient will do better with blood pressure diet.  If blood pressure not getting better will need to reevaluate blood pressure treatment.      Relevant Medications   metoprolol succinate (TOPROL-XL) 50 MG 24 hr tablet   lisinopril (PRINIVIL,ZESTRIL) 20 MG tablet   ezetimibe-simvastatin (VYTORIN) 10-40 MG tablet   Other Relevant Orders   CBC with Differential/Platelet   Comprehensive metabolic panel   Lipid panel   PSA   TSH   Urinalysis, Routine w reflex microscopic   CAD (coronary artery disease)    The current medical regimen is effective;  continue present plan and medications.       Relevant Medications   metoprolol succinate (TOPROL-XL) 50 MG 24 hr tablet   lisinopril (PRINIVIL,ZESTRIL) 20 MG tablet   ezetimibe-simvastatin (VYTORIN) 10-40 MG tablet   clopidogrel (PLAVIX) 75 MG tablet   Other Relevant Orders   CBC with Differential/Platelet   Comprehensive metabolic panel   Lipid panel   Urinalysis, Routine w reflex microscopic     Genitourinary   BPH (benign prostatic hyperplasia)    The current medical regimen is effective;  continue present plan and medications.       Relevant Medications   finasteride (PROSCAR) 5 MG tablet   Other Relevant Orders   PSA     Other    Hyperlipemia    The current medical regimen is effective;  continue present plan and medications.       Relevant Medications   metoprolol succinate (TOPROL-XL) 50 MG 24 hr tablet   lisinopril (PRINIVIL,ZESTRIL) 20 MG tablet   ezetimibe-simvastatin (VYTORIN) 10-40 MG tablet   Other Relevant Orders   CBC with Differential/Platelet   Comprehensive metabolic panel  Lipid panel   Urinalysis, Routine w reflex microscopic   Gout    The current medical regimen is effective;  continue present plan and medications. a      Relevant Medications   allopurinol (ZYLOPRIM) 100 MG tablet   Heart murmur    Will refer to cardiology to further evaluate and characterize and schedule cardiac echo if needed.      Relevant Orders   Ambulatory referral to Cardiology    Other Visit Diagnoses    Thyroid disorder screen       Relevant Orders   TSH   Elevated PSA       Relevant Orders   PSA       Follow up plan: Return in about 6 months (around 05/24/2018) for BMP,  Lipids, ALT, AST.

## 2017-11-22 ENCOUNTER — Telehealth: Payer: Self-pay

## 2017-11-22 DIAGNOSIS — I10 Essential (primary) hypertension: Secondary | ICD-10-CM

## 2017-11-22 DIAGNOSIS — R7989 Other specified abnormal findings of blood chemistry: Secondary | ICD-10-CM

## 2017-11-22 LAB — LIPID PANEL
CHOL/HDL RATIO: 4.1 ratio (ref 0.0–5.0)
Cholesterol, Total: 163 mg/dL (ref 100–199)
HDL: 40 mg/dL (ref >39)
LDL CALC: 82 mg/dL (ref 0–99)
Triglycerides: 205 mg/dL — ABNORMAL HIGH (ref 0–149)
VLDL Cholesterol Cal: 41 mg/dL — ABNORMAL HIGH (ref 5–40)

## 2017-11-22 LAB — COMPREHENSIVE METABOLIC PANEL
ALBUMIN: 4.3 g/dL (ref 3.6–4.8)
ALT: 28 IU/L (ref 0–44)
AST: 25 IU/L (ref 0–40)
Albumin/Globulin Ratio: 1.9 (ref 1.2–2.2)
Alkaline Phosphatase: 52 IU/L (ref 39–117)
BUN/Creatinine Ratio: 13 (ref 10–24)
BUN: 14 mg/dL (ref 8–27)
Bilirubin Total: 0.4 mg/dL (ref 0.0–1.2)
CALCIUM: 9.4 mg/dL (ref 8.6–10.2)
CO2: 22 mmol/L (ref 20–29)
CREATININE: 1.06 mg/dL (ref 0.76–1.27)
Chloride: 105 mmol/L (ref 96–106)
GFR, EST AFRICAN AMERICAN: 82 mL/min/{1.73_m2} (ref >59)
GFR, EST NON AFRICAN AMERICAN: 71 mL/min/{1.73_m2} (ref >59)
GLOBULIN, TOTAL: 2.3 g/dL (ref 1.5–4.5)
Glucose: 94 mg/dL (ref 65–99)
Potassium: 4.2 mmol/L (ref 3.5–5.2)
SODIUM: 144 mmol/L (ref 134–144)
TOTAL PROTEIN: 6.6 g/dL (ref 6.0–8.5)

## 2017-11-22 LAB — CBC WITH DIFFERENTIAL/PLATELET
BASOS: 0 %
Basophils Absolute: 0 10*3/uL (ref 0.0–0.2)
EOS (ABSOLUTE): 0.2 10*3/uL (ref 0.0–0.4)
EOS: 3 %
HEMATOCRIT: 36.1 % — AB (ref 37.5–51.0)
HEMOGLOBIN: 12.3 g/dL — AB (ref 13.0–17.7)
IMMATURE GRANS (ABS): 0 10*3/uL (ref 0.0–0.1)
IMMATURE GRANULOCYTES: 0 %
LYMPHS: 42 %
Lymphocytes Absolute: 2.7 10*3/uL (ref 0.7–3.1)
MCH: 31.5 pg (ref 26.6–33.0)
MCHC: 34.1 g/dL (ref 31.5–35.7)
MCV: 92 fL (ref 79–97)
MONOCYTES: 9 %
MONOS ABS: 0.6 10*3/uL (ref 0.1–0.9)
Neutrophils Absolute: 2.9 10*3/uL (ref 1.4–7.0)
Neutrophils: 46 %
Platelets: 230 10*3/uL (ref 150–379)
RBC: 3.91 x10E6/uL — ABNORMAL LOW (ref 4.14–5.80)
RDW: 13.8 % (ref 12.3–15.4)
WBC: 6.4 10*3/uL (ref 3.4–10.8)

## 2017-11-22 LAB — TSH: TSH: 3.87 u[IU]/mL (ref 0.450–4.500)

## 2017-11-22 LAB — PSA: PROSTATE SPECIFIC AG, SERUM: 2.5 ng/mL (ref 0.0–4.0)

## 2017-11-22 NOTE — Telephone Encounter (Signed)
Encounter opened to place future orders per provider's orders. See Result note from 11/22/17

## 2017-11-28 ENCOUNTER — Other Ambulatory Visit: Payer: 59

## 2017-11-28 DIAGNOSIS — R7989 Other specified abnormal findings of blood chemistry: Secondary | ICD-10-CM

## 2017-11-28 DIAGNOSIS — I10 Essential (primary) hypertension: Secondary | ICD-10-CM

## 2017-11-29 LAB — CBC WITH DIFFERENTIAL/PLATELET
BASOS ABS: 0 10*3/uL (ref 0.0–0.2)
Basos: 0 %
EOS (ABSOLUTE): 0.2 10*3/uL (ref 0.0–0.4)
Eos: 2 %
Hematocrit: 36.1 % — ABNORMAL LOW (ref 37.5–51.0)
Hemoglobin: 12.2 g/dL — ABNORMAL LOW (ref 13.0–17.7)
Immature Grans (Abs): 0 10*3/uL (ref 0.0–0.1)
Immature Granulocytes: 0 %
Lymphocytes Absolute: 2.4 10*3/uL (ref 0.7–3.1)
Lymphs: 30 %
MCH: 31.9 pg (ref 26.6–33.0)
MCHC: 33.8 g/dL (ref 31.5–35.7)
MCV: 94 fL (ref 79–97)
MONOS ABS: 0.3 10*3/uL (ref 0.1–0.9)
Monocytes: 4 %
NEUTROS ABS: 4.9 10*3/uL (ref 1.4–7.0)
Neutrophils: 64 %
Platelets: 238 10*3/uL (ref 150–379)
RBC: 3.83 x10E6/uL — AB (ref 4.14–5.80)
RDW: 14 % (ref 12.3–15.4)
WBC: 7.8 10*3/uL (ref 3.4–10.8)

## 2017-12-03 ENCOUNTER — Telehealth: Payer: Self-pay | Admitting: Family Medicine

## 2017-12-03 NOTE — Telephone Encounter (Signed)
-----   Message from Amada Kingfisher, [place] sent at 12/03/2017  4:32 PM EDT ----- Patient was transferred to provider for telephone conversation.

## 2017-12-03 NOTE — Telephone Encounter (Signed)
Phone call Discussed with patient's wife hemoglobin stable but still slightly low will recheck 1 month and if still low will further evaluate.

## 2018-01-06 ENCOUNTER — Ambulatory Visit: Payer: Self-pay | Admitting: Cardiovascular Disease

## 2018-01-30 ENCOUNTER — Encounter: Payer: 59 | Admitting: Family Medicine

## 2018-02-26 ENCOUNTER — Telehealth: Payer: Self-pay | Admitting: Cardiovascular Disease

## 2018-02-26 NOTE — Telephone Encounter (Signed)
Patient returning call.

## 2018-03-05 NOTE — Progress Notes (Signed)
Cardiology Office Note  Date:  03/06/2018   ID:  Mark Quinn, DOB 1946-07-27, MRN [id number]  PCP:  Guadalupe Maple, MD   Chief Complaint  Patient presents with  . other    Ref by Dr. Jeananne Rama for heart murmur and CAD s/p stents x 2 in 2006. Meds reviewed by the pt. verbally. Pt. had a cardiac cath in 2006 at [facility] but was transferred to [facility] for stent placements. The patient hasn't been seen by a Cardiologist in 15 years. Pt. c/o shortness of breath.     HPI:  [name] is a 72 yo gentleman with past medical history of Hypertension Hyperlipidemia Cad, stent x 2 2006 Referred by Dr. Golden Pop for heart murmur  Total cholesterol 166 DL 80  Previous angina sx: Nausea, sick feeling Stress test,  11/2004 cath Elective PCI to RCA and ramus. JR4 guide. pRCA predilated with a 2.5x15 Sprinter and a 3.5x23 and 3.5x13 Cypher was deployed at 65 Atm and post dilated with a 3.75x20 Hysail at 16 atm. Next the ramus was directly  stented with a 4.0x12 Driver to [place]. Post PCI shots reveal good  results and we used the StarClose device for closure.  Sedentary, SOB with exertion Works 3rd shift, makes tools  EKG personally reviewed by myself on todays visit  shows normal sinus rhythm with rate 61 bpm,  No significant ST or T-wave changes, consider old inferior MI   PMH:   has a past medical history of CAD (coronary artery disease), Cellulitis, Gout, Melanoma ([place]) (06/2013), and Obesity.  PSH:    Past Surgical History:  Procedure Laterality Date  . CARDIAC CATHETERIZATION  2006   [facility]  . CORONARY STENT PLACEMENT  2006    CYPHER drug eluting Stent RX 3.50 X 25m/CYPHER drug eluting Stent RX 3.50 X 261m   Current Outpatient Medications  Medication Sig Dispense Refill  . allopurinol (ZYLOPRIM) 100 MG tablet Take 1 tablet (100 mg total) by mouth daily. 90 tablet 4  . aspirin EC 81 MG tablet Take 81 mg by mouth daily.    . clopidogrel (PLAVIX) 75 MG tablet Take 1 tablet  (75 mg total) by mouth daily. 90 tablet 4  . diphenhydrAMINE (SOMINEX) 25 MG tablet Take 25 mg by mouth at bedtime as needed for sleep.    . diphenhydramine-acetaminophen (TYLENOL PM) 25-500 MG TABS tablet Take 1 tablet by mouth at bedtime as needed.    . ezetimibe-simvastatin (VYTORIN) 10-40 MG tablet Take 1 tablet by mouth daily. 90 tablet 4  . finasteride (PROSCAR) 5 MG tablet Take 1 tablet (5 mg total) by mouth daily. 90 tablet 4  . lisinopril (PRINIVIL,ZESTRIL) 20 MG tablet Take 1 tablet (20 mg total) by mouth daily. 90 tablet 4  . meloxicam (MOBIC) 15 MG tablet Take 1 tablet (15 mg total) by mouth daily. 90 tablet 1  . methocarbamol (ROBAXIN) 500 MG tablet Take 500 mg by mouth 4 (four) times daily as needed.  1  . metoprolol succinate (TOPROL-XL) 50 MG 24 hr tablet Take 1 tablet (50 mg total) by mouth daily. Take with or immediately following a meal. 90 tablet 4  . Multiple Vitamin (MULTIVITAMIN) tablet Take 1 tablet by mouth daily.    . RABEprazole (ACIPHEX) 20 MG tablet Take 1 tablet (20 mg total) by mouth daily. 90 tablet 4   No current facility-administered medications for this visit.      Allergies:   Other   Social History:  The  patient  reports that he quit smoking about 42 years ago. His smoking use included cigarettes. He has a 8.00 pack-year smoking history. He has never used smokeless tobacco. He reports that he drinks alcohol. He reports that he does not use drugs.   Family History:   family history includes Diabetes in his son; Heart disease in his mother; Hyperlipidemia in his mother.    Review of Systems: Review of Systems  Constitutional: Negative.   Respiratory: Negative.   Cardiovascular: Negative.   Gastrointestinal: Negative.   Musculoskeletal: Negative.   Neurological: Negative.   Psychiatric/Behavioral: Negative.   All other systems reviewed and are negative.    PHYSICAL EXAM: VS:  BP 130/86 (BP Location: Right Arm, Patient Position: Sitting, Cuff  Size: Normal)   Pulse 61   Ht 5' 10" (1.778 m)   Wt 196 lb 4 oz (89 kg)   BMI 28.16 kg/m  , BMI Body mass index is 28.16 kg/m. GEN: Well nourished, well developed, in no acute distress  HEENT: normal  Neck: no JVD, carotid bruits, or masses Cardiac: RRR; no murmurs, rubs, or gallops,no edema  Respiratory:  clear to auscultation bilaterally, normal work of breathing GI: soft, nontender, nondistended, + BS MS: no deformity or atrophy  Skin: warm and dry, no rash Neuro:  Strength and sensation are intact Psych: euthymic mood, full affect    Recent Labs: 11/21/2017: ALT 28; BUN 14; Creatinine, Ser 1.06; Potassium 4.2; Sodium 144; TSH 3.870 11/28/2017: Hemoglobin 12.2; Platelets 238    Lipid Panel Lab Results  Component Value Date   CHOL 163 11/21/2017   HDL 40 11/21/2017   LDLCALC 82 11/21/2017   TRIG 205 (H) 11/21/2017      Wt Readings from Last 3 Encounters:  03/06/18 196 lb 4 oz (89 kg)  11/21/17 196 lb 9.6 oz (89.2 kg)  05/20/17 196 lb (88.9 kg)       ASSESSMENT AND PLAN:  Coronary artery disease due to lipid rich plaque Previous cardiac catheterization report has been requested Prior stent to proximal RCA and ramus in 2004 Currently with no anginal symptoms Recommended any anginal symptoms we would order stress testing  Essential hypertension, benign Blood pressure is well controlled on today's visit. No changes made to the medications.  Heart murmur - Plan: ECHOCARDIOGRAM COMPLETE Likely aortic valve sclerosis without significant stenosis  we have offered echocardiogram for baseline Order has been placed  Mixed hyperlipidemia LDL above goal When he needs refill on a cholesterol medication would change to Crestor 40 mg daily with Zetia 10 mg daily Recommended left on modification, walking program and weight loss  Disposition:   F/U  12 months   Total encounter time more than 60 minutes  Greater than 50% was spent in counseling and coordination of care  with the patient    Orders Placed This Encounter  Procedures  . ECHOCARDIOGRAM COMPLETE     Signed, Esmond Plants, M.D., Ph.D. 03/06/2018  [place], [place]

## 2018-03-06 ENCOUNTER — Encounter: Payer: Self-pay | Admitting: Cardiovascular Disease

## 2018-03-06 ENCOUNTER — Encounter

## 2018-03-06 ENCOUNTER — Ambulatory Visit: Payer: 59 | Admitting: Cardiovascular Disease

## 2018-03-06 VITALS — BP 130/86 | HR 61 | Ht 70.0 in | Wt 196.2 lb

## 2018-03-06 DIAGNOSIS — I10 Essential (primary) hypertension: Secondary | ICD-10-CM | POA: Diagnosis not present

## 2018-03-06 DIAGNOSIS — I2583 Coronary atherosclerosis due to lipid rich plaque: Secondary | ICD-10-CM

## 2018-03-06 DIAGNOSIS — I2510 Atherosclerotic heart disease of native coronary artery without angina pectoris: Secondary | ICD-10-CM | POA: Diagnosis not present

## 2018-03-06 DIAGNOSIS — R011 Cardiac murmur, unspecified: Secondary | ICD-10-CM | POA: Diagnosis not present

## 2018-03-06 DIAGNOSIS — E782 Mixed hyperlipidemia: Secondary | ICD-10-CM

## 2018-03-06 NOTE — Patient Instructions (Addendum)
Medication Instructions:   No medication changes made  Labwork:  No new labs needed  Testing/Procedures:  We will schedule an echocardiogram for murmur, aortic valve disease   Follow-Up: It was a pleasure seeing you in the office today. Please call us if you have new issues that need to be addressed before your next appt.  [phone]  Your physician wants you to follow-up in: 12 months as needed.  You will receive a reminder letter in the mail two months in advance. If you don't receive a letter, please call our office to schedule the follow-up appointment.  If you need a refill on your cardiac medications before your next appointment, please call your pharmacy.  For educational health videos Log in to : [email or web address] Or : [email or web address], password : [email or web address]

## 2018-03-07 NOTE — Addendum Note (Signed)
Addended by: Anselm Pancoast on: 03/07/2018 11:47 AM   Modules accepted: Orders

## 2018-03-20 ENCOUNTER — Ambulatory Visit (INDEPENDENT_AMBULATORY_CARE_PROVIDER_SITE_OTHER): Payer: 59

## 2018-03-20 ENCOUNTER — Other Ambulatory Visit: Payer: Self-pay

## 2018-03-20 DIAGNOSIS — R011 Cardiac murmur, unspecified: Secondary | ICD-10-CM

## 2018-05-29 ENCOUNTER — Encounter: Payer: Self-pay | Admitting: Family Medicine

## 2018-05-29 ENCOUNTER — Ambulatory Visit (INDEPENDENT_AMBULATORY_CARE_PROVIDER_SITE_OTHER): Payer: 59 | Admitting: Family Medicine

## 2018-05-29 VITALS — BP 133/82 | HR 67 | Temp 98.4°F | Wt 194.0 lb

## 2018-05-29 DIAGNOSIS — E785 Hyperlipidemia, unspecified: Secondary | ICD-10-CM

## 2018-05-29 DIAGNOSIS — I10 Essential (primary) hypertension: Secondary | ICD-10-CM

## 2018-05-29 DIAGNOSIS — I358 Other nonrheumatic aortic valve disorders: Secondary | ICD-10-CM | POA: Diagnosis not present

## 2018-05-29 LAB — LP+ALT+AST PICCOLO, WAIVED
ALT (SGPT) PICCOLO, WAIVED: 31 U/L (ref 10–47)
AST (SGOT) Piccolo, Waived: 33 U/L (ref 11–38)
CHOL/HDL RATIO PICCOLO,WAIVE: 3.8 mg/dL
Cholesterol Piccolo, Waived: 160 mg/dL (ref <200)
HDL Chol Piccolo, Waived: 43 mg/dL — ABNORMAL LOW (ref >59)
LDL CHOL CALC PICCOLO WAIVED: 71 mg/dL (ref <100)
TRIGLYCERIDES PICCOLO,WAIVED: 229 mg/dL — AB (ref <150)
VLDL CHOL CALC PICCOLO,WAIVE: 46 mg/dL — AB (ref <30)

## 2018-05-29 MED ORDER — MELOXICAM 15 MG PO TABS: 15.0000 mg | ORAL_TABLET | Freq: Every day | ORAL | 1 refills | Status: DC

## 2018-05-29 NOTE — Progress Notes (Signed)
BP 133/82 (BP Location: Left Arm, Patient Position: Sitting, Cuff Size: Normal)   Pulse 67   Temp 98.4 F (36.9 C)   Wt 194 lb (88 kg)   SpO2 96%   BMI 27.84 kg/m    Subjective:    Patient ID: Mark Quinn, male    DOB: 08-Apr-1946, 72 y.o.   MRN: [id number]  HPI: Mark Quinn is a 72 y.o. male  Patient all in all doing well no complaints cardiac echo was normal showing some very mild aortic valve sclerosis asymptomatic with recommendation for follow-up every few years. Also recommended just taking baby aspirin not Plavix also. Hypertension doing well with no complaints from medications taken faithfully without problems. Also taking Vytorin without problems for cholesterol. Discussed reflux still has some occasional reflux issues but taking AcipHex without problems. No gout signs or symptoms.   Relevant past medical, surgical, family and social history reviewed and updated as indicated. Interim medical history since our last visit reviewed. Allergies and medications reviewed and updated.  Review of Systems  Constitutional: Negative.   Respiratory: Negative.   Cardiovascular: Negative.     Per HPI unless specifically indicated above     Objective:    BP 133/82 (BP Location: Left Arm, Patient Position: Sitting, Cuff Size: Normal)   Pulse 67   Temp 98.4 F (36.9 C)   Wt 194 lb (88 kg)   SpO2 96%   BMI 27.84 kg/m   Wt Readings from Last 3 Encounters:  05/29/18 194 lb (88 kg)  03/06/18 196 lb 4 oz (89 kg)  11/21/17 196 lb 9.6 oz (89.2 kg)    Physical Exam  Constitutional: He is oriented to person, place, and time. He appears well-developed and well-nourished.  HENT:  Head: Normocephalic and atraumatic.  Eyes: Conjunctivae and EOM are normal.  Neck: Normal range of motion.  Cardiovascular: Normal rate, regular rhythm and normal heart sounds.  Pulmonary/Chest: Effort normal and breath sounds normal.  Musculoskeletal: Normal range of motion.  Neurological: He  is alert and oriented to person, place, and time.  Skin: No erythema.  Psychiatric: He has a normal mood and affect. His behavior is normal. Judgment and thought content normal.    Results for orders placed or performed in visit on 11/28/17  CBC with Differential/Platelet  Result Value Ref Range   WBC 7.8 3.4 - 10.8 x10E3/uL   RBC 3.83 (L) 4.14 - 5.80 x10E6/uL   Hemoglobin 12.2 (L) 13.0 - 17.7 g/dL   Hematocrit 36.1 (L) 37.5 - 51.0 %   MCV 94 79 - 97 fL   MCH 31.9 26.6 - 33.0 pg   MCHC 33.8 31.5 - 35.7 g/dL   RDW 14.0 12.3 - 15.4 %   Platelets 238 150 - 379 x10E3/uL   Neutrophils 64 Not Estab. %   Lymphs 30 Not Estab. %   Monocytes 4 Not Estab. %   Eos 2 Not Estab. %   Basos 0 Not Estab. %   Neutrophils Absolute 4.9 1.4 - 7.0 x10E3/uL   Lymphocytes Absolute 2.4 0.7 - 3.1 x10E3/uL   Monocytes Absolute 0.3 0.1 - 0.9 x10E3/uL   EOS (ABSOLUTE) 0.2 0.0 - 0.4 x10E3/uL   Basophils Absolute 0.0 0.0 - 0.2 x10E3/uL   Immature Granulocytes 0 Not Estab. %   Immature Grans (Abs) 0.0 0.0 - 0.1 x10E3/uL      Assessment & Plan:   Problem List Items Addressed This Visit      Cardiovascular and Mediastinum   Essential  hypertension, benign   Relevant Orders   Basic metabolic panel   Aortic valve sclerosis     Other   Hyperlipemia - Primary   Relevant Orders   LP+ALT+AST Piccolo, Waived       Follow up plan: Return in about 6 months (around 11/28/2018) for Physical Exam.

## 2018-05-30 ENCOUNTER — Encounter: Payer: Self-pay | Admitting: Family Medicine

## 2018-05-30 LAB — BASIC METABOLIC PANEL
BUN/Creatinine Ratio: 20 (ref 10–24)
BUN: 18 mg/dL (ref 8–27)
CO2: 25 mmol/L (ref 20–29)
Calcium: 9.3 mg/dL (ref 8.6–10.2)
Chloride: 103 mmol/L (ref 96–106)
Creatinine, Ser: 0.89 mg/dL (ref 0.76–1.27)
GFR calc Af Amer: 101 mL/min/{1.73_m2} (ref >59)
GFR calc non Af Amer: 87 mL/min/{1.73_m2} (ref >59)
GLUCOSE: 87 mg/dL (ref 65–99)
POTASSIUM: 4.3 mmol/L (ref 3.5–5.2)
Sodium: 144 mmol/L (ref 134–144)

## 2018-12-03 ENCOUNTER — Other Ambulatory Visit: Payer: Self-pay

## 2018-12-03 ENCOUNTER — Ambulatory Visit (INDEPENDENT_AMBULATORY_CARE_PROVIDER_SITE_OTHER): Payer: 59 | Admitting: Family Medicine

## 2018-12-03 ENCOUNTER — Encounter: Payer: Self-pay | Admitting: Family Medicine

## 2018-12-03 DIAGNOSIS — M1000 Idiopathic gout, unspecified site: Secondary | ICD-10-CM | POA: Diagnosis not present

## 2018-12-03 DIAGNOSIS — M1990 Unspecified osteoarthritis, unspecified site: Secondary | ICD-10-CM | POA: Insufficient documentation

## 2018-12-03 DIAGNOSIS — N400 Enlarged prostate without lower urinary tract symptoms: Secondary | ICD-10-CM

## 2018-12-03 DIAGNOSIS — I10 Essential (primary) hypertension: Secondary | ICD-10-CM | POA: Diagnosis not present

## 2018-12-03 DIAGNOSIS — D649 Anemia, unspecified: Secondary | ICD-10-CM

## 2018-12-03 DIAGNOSIS — E785 Hyperlipidemia, unspecified: Secondary | ICD-10-CM

## 2018-12-03 MED ORDER — METOPROLOL SUCCINATE ER 50 MG PO TB24: 50.0000 mg | ORAL_TABLET | Freq: Every day | ORAL | 4 refills | Status: DC

## 2018-12-03 MED ORDER — MELOXICAM 15 MG PO TABS: 15.0000 mg | ORAL_TABLET | Freq: Every day | ORAL | 1 refills | Status: DC

## 2018-12-03 MED ORDER — ALLOPURINOL 100 MG PO TABS: 100.0000 mg | ORAL_TABLET | Freq: Every day | ORAL | 4 refills | Status: DC

## 2018-12-03 MED ORDER — FINASTERIDE 5 MG PO TABS: 5.0000 mg | ORAL_TABLET | Freq: Every day | ORAL | 4 refills | Status: DC

## 2018-12-03 MED ORDER — LISINOPRIL 20 MG PO TABS: 20.0000 mg | ORAL_TABLET | Freq: Every day | ORAL | 4 refills | Status: DC

## 2018-12-03 MED ORDER — RABEPRAZOLE SODIUM 20 MG PO TBEC: 20.0000 mg | DELAYED_RELEASE_TABLET | Freq: Every day | ORAL | 4 refills | Status: DC

## 2018-12-03 MED ORDER — EZETIMIBE-SIMVASTATIN 10-40 MG PO TABS: 1.0000 | ORAL_TABLET | Freq: Every day | ORAL | 4 refills | Status: DC

## 2018-12-03 NOTE — Assessment & Plan Note (Signed)
The current medical regimen is effective;  continue present plan and medications.  

## 2018-12-03 NOTE — Progress Notes (Signed)
BP 125/76 (BP Location: Left Arm, Patient Position: Sitting, Cuff Size: Normal)   Pulse 63   Ht 5\' 10"  (1.778 m)   Wt 195 lb (88.5 kg)   BMI 27.98 kg/m    Subjective:    Patient ID: Mark Quinn, male    DOB: September 25, 1945, 73 y.o.   MRN: [id number]  HPI: Mark Quinn is a 73 y.o. male  Chief Complaint  Patient presents with  . Hypertension  . Hyperlipidemia   Telemedicine using audio/video telecommunications for a synchronous communication visit. Today's visit due to COVID-19 isolation precautions I connected with and verified that I am speaking with the correct person using two identifiers.   I discussed the limitations, risks, security and privacy concerns of performing an evaluation and management service by telecommunication and the availability of in person appointments. I also discussed with the patient that there may be a patient responsible charge related to this service. The patient expressed understanding and agreed to proceed. The patient's location is home. I am at home. Patient follow-up multiple medical problems doing well with no complaints practicing social distancing and going to work but work is very spread out and is able to maintain his social distancing. Reviewed medication and doing well no complaints from cholesterol blood pressure or gout medications no signs or symptoms.  Prostate doing well. Does have some morning stiffness and arthritis complaints which is treated well with meloxicam. No cardiovascular complaints or concerns. Relevant past medical, surgical, family and social history reviewed and updated as indicated. Interim medical history since our last visit reviewed. Allergies and medications reviewed and updated.  Review of Systems  Constitutional: Negative.   Respiratory: Negative.   Cardiovascular: Negative.     Per HPI unless specifically indicated above     Objective:    BP 125/76 (BP Location: Left Arm, Patient Position: Sitting,  Cuff Size: Normal)   Pulse 63   Ht 5\' 10"  (1.778 m)   Wt 195 lb (88.5 kg)   BMI 27.98 kg/m   Wt Readings from Last 3 Encounters:  12/03/18 195 lb (88.5 kg)  05/29/18 194 lb (88 kg)  03/06/18 196 lb 4 oz (89 kg)    Physical Exam  Results for orders placed or performed in visit on 90/30/09  Basic metabolic panel  Result Value Ref Range   Glucose 87 65 - 99 mg/dL   BUN 18 8 - 27 mg/dL   Creatinine, Ser 0.89 0.76 - 1.27 mg/dL   GFR calc non Af Amer 87 >59 mL/min/1.73   GFR calc Af Amer 101 >59 mL/min/1.73   BUN/Creatinine Ratio 20 10 - 24   Sodium 144 134 - 144 mmol/L   Potassium 4.3 3.5 - 5.2 mmol/L   Chloride 103 96 - 106 mmol/L   CO2 25 20 - 29 mmol/L   Calcium 9.3 8.6 - 10.2 mg/dL  LP+ALT+AST Piccolo, Waived  Result Value Ref Range   ALT (SGPT) Piccolo, Waived 31 10 - 47 U/L   AST (SGOT) Piccolo, Waived 33 11 - 38 U/L   Cholesterol Piccolo, Waived 160 <200 mg/dL   HDL Chol Piccolo, Waived 43 (L) >59 mg/dL   Triglycerides Piccolo,Waived 229 (H) <150 mg/dL   Chol/HDL Ratio Piccolo,Waive 3.8 mg/dL   LDL Chol Calc Piccolo Waived 71 <100 mg/dL   VLDL Chol Calc Piccolo,Waive 46 (H) <30 mg/dL      Assessment & Plan:   Problem List Items Addressed This Visit      Cardiovascular and Mediastinum  Essential hypertension, benign    The current medical regimen is effective;  continue present plan and medications.       Relevant Medications   ezetimibe-simvastatin (VYTORIN) 10-40 MG tablet   lisinopril (PRINIVIL,ZESTRIL) 20 MG tablet   metoprolol succinate (TOPROL-XL) 50 MG 24 hr tablet     Musculoskeletal and Integument   Arthritis    Discussed arthritis care and treatment limiting use of meloxicam taken occasional weekend off.      Relevant Medications   allopurinol (ZYLOPRIM) 100 MG tablet   meloxicam (MOBIC) 15 MG tablet     Genitourinary   BPH (benign prostatic hyperplasia)    The current medical regimen is effective;  continue present plan and medications. a       Relevant Medications   finasteride (PROSCAR) 5 MG tablet     Other   Hyperlipemia    The current medical regimen is effective;  continue present plan and medications.       Relevant Medications   ezetimibe-simvastatin (VYTORIN) 10-40 MG tablet   lisinopril (PRINIVIL,ZESTRIL) 20 MG tablet   metoprolol succinate (TOPROL-XL) 50 MG 24 hr tablet   Gout    The current medical regimen is effective;  continue present plan and medications. a      Relevant Medications   allopurinol (ZYLOPRIM) 100 MG tablet   Anemia, unspecified    Chart review patient's had very mild intermittent slightly low hemoglobin will recheck this summer and work-up as indicated.        I discussed the assessment and treatment plan with the patient. The patient was provided an opportunity to ask questions and all were answered. The patient agreed with the plan and demonstrated an understanding of the instructions.   The patient was advised to call back or seek an in-person evaluation if the symptoms worsen or if the condition fails to improve as anticipated.   I provided 21+ minutes of time during this encounter.  Follow up plan: Return in about 3 months (around 03/04/2019) for Physical Exam.

## 2018-12-03 NOTE — Assessment & Plan Note (Signed)
The current medical regimen is effective;  continue present plan and medications. a 

## 2018-12-03 NOTE — Assessment & Plan Note (Signed)
Chart review patient's had very mild intermittent slightly low hemoglobin will recheck this summer and work-up as indicated.

## 2018-12-03 NOTE — Assessment & Plan Note (Signed)
Discussed arthritis care and treatment limiting use of meloxicam taken occasional weekend off.

## 2018-12-09 ENCOUNTER — Other Ambulatory Visit: Payer: Self-pay | Admitting: Family Medicine

## 2018-12-09 ENCOUNTER — Telehealth: Payer: Self-pay | Admitting: Family Medicine

## 2018-12-09 DIAGNOSIS — M1000 Idiopathic gout, unspecified site: Secondary | ICD-10-CM

## 2018-12-09 NOTE — Telephone Encounter (Signed)
Copied from [place] [id number]. Topic: Quick Communication - Rx Refill/Question >> Dec 09, 2018 11:03 AM Virl Axe D wrote: Medication: clopidogrel (PLAVIX) 75 MG tablet  Has the patient contacted their pharmacy? Yes.   (Agent: If no, request that the patient contact the pharmacy for the refill.) (Agent: If yes, when and what did the pharmacy advise?)  Preferred Pharmacy (with phone number or street name): [id number] - [place], [address] [phone] (Phone) [phone] (Fax)    Agent: Please be advised that RX refills may take up to 3 business days. We ask that you follow-up with your pharmacy.

## 2018-12-10 ENCOUNTER — Other Ambulatory Visit: Payer: Self-pay | Admitting: Family Medicine

## 2018-12-10 DIAGNOSIS — M1000 Idiopathic gout, unspecified site: Secondary | ICD-10-CM

## 2018-12-10 DIAGNOSIS — I10 Essential (primary) hypertension: Secondary | ICD-10-CM

## 2018-12-10 MED ORDER — CLOPIDOGREL BISULFATE 75 MG PO TABS: 75.0000 mg | ORAL_TABLET | Freq: Every day | ORAL | 3 refills | Status: DC

## 2018-12-10 NOTE — Telephone Encounter (Signed)
Patient's wife, Manuela Schwartz, calling and states that the pharmacy did not receive the patient's prescription on 12/03/2018. States they are needing the Metoprolol, rabeprazole, and allopurinol.   Spoke to [place] at the pharmacy and he states that they did not receive these medications. Please advise.

## 2018-12-11 MED ORDER — METOPROLOL SUCCINATE ER 50 MG PO TB24: 50.0000 mg | ORAL_TABLET | Freq: Every day | ORAL | 4 refills | Status: DC

## 2018-12-11 MED ORDER — ALLOPURINOL 100 MG PO TABS: 100.0000 mg | ORAL_TABLET | Freq: Every day | ORAL | 4 refills | Status: DC

## 2018-12-11 MED ORDER — RABEPRAZOLE SODIUM 20 MG PO TBEC: 20.0000 mg | DELAYED_RELEASE_TABLET | Freq: Every day | ORAL | 4 refills | Status: DC

## 2018-12-11 NOTE — Addendum Note (Signed)
Addended by: Golden Pop A on: 12/11/2018 11:17 AM   Modules accepted: Orders

## 2018-12-12 NOTE — Telephone Encounter (Signed)
Received refill errors for metoprolol and aciphex; spoke with Abigail Butts at [facility]; she states that both medications were filled on 12/11/2018.

## 2019-01-08 ENCOUNTER — Other Ambulatory Visit: Payer: Self-pay | Admitting: Family Medicine

## 2019-01-08 ENCOUNTER — Telehealth: Payer: Self-pay | Admitting: Family Medicine

## 2019-01-08 DIAGNOSIS — I10 Essential (primary) hypertension: Secondary | ICD-10-CM

## 2019-01-08 DIAGNOSIS — E785 Hyperlipidemia, unspecified: Secondary | ICD-10-CM

## 2019-01-08 MED ORDER — EZETIMIBE-SIMVASTATIN 10-40 MG PO TABS: 1.0000 | ORAL_TABLET | Freq: Every day | ORAL | 4 refills | Status: DC

## 2019-01-08 NOTE — Telephone Encounter (Signed)
Copied from [place]. Topic: Quick Communication - Rx Refill/Question >> Jan 08, 2019 10:17 AM Mathis Bud wrote: Medication: ezetimibe-simvastatin (VYTORIN) 10-40 MG   Has the patient contacted their pharmacy? Yes, no more refills. Looks like PCP denied Patient wants to know why.   Preferred Pharmacy (with phone number or street name): [id number] - [place], [address] [phone] (Phone) [phone] (Fax)    Agent: Please be advised that RX refills may take up to 3 business days. We ask that you follow-up with your pharmacy.

## 2019-01-09 ENCOUNTER — Telehealth: Payer: Self-pay | Admitting: Family Medicine

## 2019-01-09 DIAGNOSIS — I10 Essential (primary) hypertension: Secondary | ICD-10-CM

## 2019-01-09 DIAGNOSIS — E785 Hyperlipidemia, unspecified: Secondary | ICD-10-CM

## 2019-01-09 MED ORDER — LISINOPRIL 20 MG PO TABS: 20.0000 mg | ORAL_TABLET | Freq: Every day | ORAL | 4 refills | Status: DC

## 2019-01-09 MED ORDER — EZETIMIBE-SIMVASTATIN 10-40 MG PO TABS: 1.0000 | ORAL_TABLET | Freq: Every day | ORAL | 4 refills | Status: DC

## 2019-01-09 NOTE — Telephone Encounter (Signed)
Copied from [place] [id number]. Topic: Quick Communication - Rx Refill/Question >> Jan 09, 2019  8:29 AM Celene Kras A wrote: Medication: ezetimibe-simvastatin (VYTORIN) 10-40 MG tablet  & lisinopril (PRINIVIL,ZESTRIL) 20 MG tablet  Has the patient contacted their pharmacy? Yes. Pts wife states pt will need this medication by Tuesday. Please advise.  (Agent: If no, request that the patient contact the pharmacy for the refill.) (Agent: If yes, when and what did the pharmacy advise?)  Preferred Pharmacy (with phone number or street name): [id number] - [place], [place] [address] [place]  [address] Phone: [phone] Fax: [phone] Not a 24 hour pharmacy; exact hours not known.    Agent: Please be advised that RX refills may take up to 3 business days. We ask that you follow-up with your pharmacy.

## 2019-03-05 ENCOUNTER — Encounter: Payer: 59 | Admitting: Family Medicine

## 2019-03-09 ENCOUNTER — Encounter: Payer: Self-pay | Admitting: Family Medicine

## 2019-03-18 ENCOUNTER — Encounter: Payer: 59 | Admitting: Family Medicine

## 2019-03-26 ENCOUNTER — Ambulatory Visit: Payer: 59 | Admitting: Physician Assistant

## 2019-03-31 ENCOUNTER — Other Ambulatory Visit: Payer: Self-pay

## 2019-03-31 ENCOUNTER — Ambulatory Visit: Payer: 59 | Admitting: Nurse Practitioner

## 2019-03-31 ENCOUNTER — Encounter: Payer: 59 | Admitting: Nurse Practitioner

## 2019-03-31 ENCOUNTER — Encounter: Payer: Self-pay | Admitting: Nurse Practitioner

## 2019-03-31 VITALS — BP 138/83 | HR 63 | Temp 98.7°F | Ht 70.0 in | Wt 190.0 lb

## 2019-03-31 DIAGNOSIS — I2583 Coronary atherosclerosis due to lipid rich plaque: Secondary | ICD-10-CM

## 2019-03-31 DIAGNOSIS — Z23 Encounter for immunization: Secondary | ICD-10-CM | POA: Diagnosis not present

## 2019-03-31 DIAGNOSIS — E782 Mixed hyperlipidemia: Secondary | ICD-10-CM | POA: Diagnosis not present

## 2019-03-31 DIAGNOSIS — M1000 Idiopathic gout, unspecified site: Secondary | ICD-10-CM | POA: Diagnosis not present

## 2019-03-31 DIAGNOSIS — N400 Enlarged prostate without lower urinary tract symptoms: Secondary | ICD-10-CM

## 2019-03-31 DIAGNOSIS — I10 Essential (primary) hypertension: Secondary | ICD-10-CM | POA: Diagnosis not present

## 2019-03-31 DIAGNOSIS — Z0000 Encounter for general adult medical examination without abnormal findings: Secondary | ICD-10-CM

## 2019-03-31 DIAGNOSIS — D649 Anemia, unspecified: Secondary | ICD-10-CM

## 2019-03-31 DIAGNOSIS — I2510 Atherosclerotic heart disease of native coronary artery without angina pectoris: Secondary | ICD-10-CM

## 2019-03-31 NOTE — Assessment & Plan Note (Signed)
CBC today.  

## 2019-03-31 NOTE — Assessment & Plan Note (Signed)
Chronic, stable.  Continue current medication regimen as is effective and no symptoms.

## 2019-03-31 NOTE — Assessment & Plan Note (Signed)
Chronic, stable.  Continue current medication regimen.  Labs today. 

## 2019-03-31 NOTE — Assessment & Plan Note (Signed)
Chronic, ongoing.  Continue current medication regimen and adjust as needed.  Labs today. 

## 2019-03-31 NOTE — Assessment & Plan Note (Signed)
Continue Plavix and Vytorin for prevention.

## 2019-03-31 NOTE — Progress Notes (Signed)
BP 138/83    Pulse 63    Temp 98.7 F (37.1 C) (Oral)    Ht 5\' 10"  (1.778 m)    Wt 190 lb (86.2 kg)    SpO2 96%    BMI 27.26 kg/m    Subjective:    Patient ID: Mark Quinn, male    DOB: 05-23-1946, 73 y.o.   MRN: [id number]  HPI: Mark Quinn is a 73 y.o. male presenting on 03/31/2019 for comprehensive medical examination. Current medical complaints include:none  He currently lives with: wife Interim Problems from his last visit: no   HYPERTENSION / HYPERLIPIDEMIA Continues on Plavix, Metoprolol, Lisinopril, and Vytorin. Satisfied with current treatment? yes Duration of hypertension: chronic BP monitoring frequency: weekly BP range: 120-140/80's BP medication side effects: no Duration of hyperlipidemia: chronic Cholesterol medication side effects: no Cholesterol supplements: none Medication compliance: good compliance Aspirin: no Recent stressors: no Recurrent headaches: no Visual changes: no Palpitations: no Dyspnea: no Chest pain: no Lower extremity edema: no Dizzy/lightheaded: no   GOUT Continues on Allopurinol. Duration:chronic Right 1st metatarsophalangeal pain: no Left 1st metatarsophalangeal pain: no Right knee pain: no Left knee pain: no Swelling: no Redness: no Trauma: no Recent dietary change or indiscretion: no Fevers: no Nausea/vomiting: no Aggravating factors: alcohol and certain foods Alleviating factors: medication Status:  stable   BPH Continues on Finasteride. BPH status: stable Satisfied with current treatment?: yes Medication side effects: no Medication compliance: good compliance Duration: chronic Nocturia: no Urinary frequency:no Incomplete voiding: no Urgency: no Weak urinary stream: no Straining to start stream: no Dysuria: no Treatments attempted: Finasteride IPSS Questionnaire (AUA-7): Over the past month   1)  How often have you had a sensation of not emptying your bladder completely after you finish urinating?  0  - Not at all  2)  How often have you had to urinate again less than two hours after you finished urinating? 0 - Not at all  3)  How often have you found you stopped and started again several times when you urinated?  0 - Not at all  4) How difficult have you found it to postpone urination?  0 - Not at all  5) How often have you had a weak urinary stream?  0 - Not at all  6) How often have you had to push or strain to begin urination?  0 - Not at all  7) How many times did you most typically get up to urinate from the time you went to bed until the time you got up in the morning?  0 - None  Total score:  0-7 mildly symptomatic   8-19 moderately symptomatic   20-35 severely symptomatic    Functional Status Survey: Is the patient deaf or have difficulty hearing?: Yes Does the patient have difficulty seeing, even when wearing glasses/contacts?: No Does the patient have difficulty concentrating, remembering, or making decisions?: No Does the patient have difficulty walking or climbing stairs?: No Does the patient have difficulty dressing or bathing?: No Does the patient have difficulty doing errands alone such as visiting a doctor's office or shopping?: No  FALL RISK: Fall Risk  03/31/2019 05/29/2018 11/21/2017 05/20/2017 11/12/2016  Falls in the past year? 0 No No No No  Number falls in past yr: 0 - - - -  Injury with Fall? 0 - - - -  Follow up Falls evaluation completed - - - -    Depression Screen Depression screen [facility] 2/9 03/31/2019 05/29/2018 05/20/2017 11/12/2016  11/10/2015  Decreased Interest 0 0 0 0 0  Down, Depressed, Hopeless 0 0 0 0 0  PHQ - 2 Score 0 0 0 0 0  Altered sleeping 0 1 - - -  Tired, decreased energy 0 1 - - -  Change in appetite 0 0 - - -  Feeling bad or failure about yourself  0 0 - - -  Trouble concentrating 0 1 - - -  Moving slowly or fidgety/restless 0 0 - - -  Suicidal thoughts 0 0 - - -  PHQ-9 Score 0 3 - - -  Difficult doing work/chores Not difficult at all Not  difficult at all - - -    Advanced Directives <no information>  Past Medical History:  Past Medical History:  Diagnosis Date   CAD (coronary artery disease)    Cellulitis    of back   Gout    Melanoma ([place]) 06/2013   Stage 1 A Excision   Obesity     Surgical History:  Past Surgical History:  Procedure Laterality Date   CARDIAC CATHETERIZATION  2006   [place]   CORONARY STENT PLACEMENT  2006    CYPHER drug eluting Stent RX 3.50 X 53mm/CYPHER drug eluting Stent RX 3.50 X 63mm    Medications:  Current Outpatient Medications on File Prior to Visit  Medication Sig   allopurinol (ZYLOPRIM) 100 MG tablet Take 1 tablet (100 mg total) by mouth daily.   clopidogrel (PLAVIX) 75 MG tablet Take 1 tablet (75 mg total) by mouth daily.   diphenhydrAMINE (SOMINEX) 25 MG tablet Take 25 mg by mouth at bedtime as needed for sleep.   diphenhydramine-acetaminophen (TYLENOL PM) 25-500 MG TABS tablet Take 1 tablet by mouth at bedtime as needed.   ezetimibe-simvastatin (VYTORIN) 10-40 MG tablet Take 1 tablet by mouth daily.   finasteride (PROSCAR) 5 MG tablet Take 1 tablet (5 mg total) by mouth daily.   lisinopril (ZESTRIL) 20 MG tablet Take 1 tablet (20 mg total) by mouth daily.   meloxicam (MOBIC) 15 MG tablet Take 1 tablet (15 mg total) by mouth daily.   methocarbamol (ROBAXIN) 500 MG tablet Take 500 mg by mouth 4 (four) times daily as needed.   metoprolol succinate (TOPROL-XL) 50 MG 24 hr tablet Take 1 tablet (50 mg total) by mouth daily. Take with or immediately following a meal.   Multiple Vitamin (MULTIVITAMIN) tablet Take 1 tablet by mouth daily.   RABEprazole (ACIPHEX) 20 MG tablet Take 1 tablet (20 mg total) by mouth daily.   No current facility-administered medications on file prior to visit.     Allergies:  Allergies  Allergen Reactions   Other     Seasonal Allergies    Social History:  Social History   Socioeconomic History   Marital status: Married     Spouse name: Not on file   Number of children: Not on file   Years of education: Not on file   Highest education level: Not on file  Occupational History   Not on file  Social Needs   Financial resource strain: Not on file   Food insecurity    Worry: Not on file    Inability: Not on file   Transportation needs    Medical: Not on file    Non-medical: Not on file  Tobacco Use   Smoking status: Former Smoker    Packs/day: 1.00    Years: 8.00    Pack years: 8.00    Types: Cigarettes  Quit date: 04/13/1975    Years since quitting: 43.9   Smokeless tobacco: Never Used  Substance and Sexual Activity   Alcohol use: Yes    Alcohol/week: 0.0 standard drinks    Comment: on occasion   Drug use: No   Sexual activity: Not on file  Lifestyle   Physical activity    Days per week: Not on file    Minutes per session: Not on file   Stress: Not on file  Relationships   Social connections    Talks on phone: Not on file    Gets together: Not on file    Attends religious service: Not on file    Active member of club or organization: Not on file    Attends meetings of clubs or organizations: Not on file    Relationship status: Not on file   Intimate partner violence    Fear of current or ex partner: Not on file    Emotionally abused: Not on file    Physically abused: Not on file    Forced sexual activity: Not on file  Other Topics Concern   Not on file  Social History Narrative   Not on file   Social History   Tobacco Use  Smoking Status Former Smoker   Packs/day: 1.00   Years: 8.00   Pack years: 8.00   Types: Cigarettes   Quit date: 04/13/1975   Years since quitting: 43.9  Smokeless Tobacco Never Used   Social History   Substance and Sexual Activity  Alcohol Use Yes   Alcohol/week: 0.0 standard drinks   Comment: on occasion    Family History:  Family History  Problem Relation Age of Onset   Heart disease Mother    Hyperlipidemia Mother      Diabetes Son     Past medical history, surgical history, medications, allergies, family history and social history reviewed with patient today and changes made to appropriate areas of the chart.   Review of Systems - negative All other ROS negative except what is listed above and in the HPI.      Objective:    BP 138/83    Pulse 63    Temp 98.7 F (37.1 C) (Oral)    Ht 5\' 10"  (1.778 m)    Wt 190 lb (86.2 kg)    SpO2 96%    BMI 27.26 kg/m   Wt Readings from Last 3 Encounters:  03/31/19 190 lb (86.2 kg)  12/03/18 195 lb (88.5 kg)  05/29/18 194 lb (88 kg)    Physical Exam Vitals signs and nursing note reviewed.  Constitutional:      General: He is awake. He is not in acute distress.    Appearance: He is well-developed. He is not ill-appearing.  HENT:     Head: Normocephalic and atraumatic.     Right Ear: Hearing, tympanic membrane, ear canal and external ear normal. No drainage.     Left Ear: Hearing, tympanic membrane, ear canal and external ear normal. No drainage.     Nose: Nose normal.     Mouth/Throat:     Mouth: Mucous membranes are moist.     Pharynx: Oropharynx is clear. Uvula midline.  Eyes:     General: Lids are normal.        Right eye: No discharge.        Left eye: No discharge.     Extraocular Movements: Extraocular movements intact.     Conjunctiva/sclera: Conjunctivae normal.  Pupils: Pupils are equal, round, and reactive to light.     Visual Fields: Right eye visual fields normal and left eye visual fields normal.  Neck:     Musculoskeletal: Normal range of motion and neck supple.     Thyroid: No thyromegaly.     Vascular: No carotid bruit.     Trachea: Trachea normal.  Cardiovascular:     Rate and Rhythm: Normal rate and regular rhythm.     Heart sounds: Normal heart sounds, S1 normal and S2 normal. No murmur. No gallop.   Pulmonary:     Effort: Pulmonary effort is normal. No accessory muscle usage or respiratory distress.     Breath sounds:  Normal breath sounds.  Abdominal:     General: Bowel sounds are normal.     Palpations: Abdomen is soft. There is no hepatomegaly or splenomegaly.     Tenderness: There is no abdominal tenderness.  Genitourinary:    Comments: Deferred per patient request. Musculoskeletal: Normal range of motion.     Right lower leg: No edema.     Left lower leg: No edema.  Skin:    General: Skin is warm and dry.     Capillary Refill: Capillary refill takes less than 2 seconds.     Findings: No rash.  Neurological:     Mental Status: He is alert and oriented to person, place, and time.     Cranial Nerves: Cranial nerves are intact.     Gait: Gait is intact.     Deep Tendon Reflexes: Reflexes are normal and symmetric.     Reflex Scores:      Brachioradialis reflexes are 2+ on the right side and 2+ on the left side.      Patellar reflexes are 2+ on the right side and 2+ on the left side. Psychiatric:        Attention and Perception: Attention normal.        Mood and Affect: Mood normal.        Speech: Speech normal.        Behavior: Behavior normal. Behavior is cooperative.        Thought Content: Thought content normal.        Cognition and Memory: Cognition normal.        Judgment: Judgment normal.     Results for orders placed or performed in visit on 66/59/93  Basic metabolic panel  Result Value Ref Range   Glucose 87 65 - 99 mg/dL   BUN 18 8 - 27 mg/dL   Creatinine, Ser 0.89 0.76 - 1.27 mg/dL   GFR calc non Af Amer 87 >59 mL/min/1.73   GFR calc Af Amer 101 >59 mL/min/1.73   BUN/Creatinine Ratio 20 10 - 24   Sodium 144 134 - 144 mmol/L   Potassium 4.3 3.5 - 5.2 mmol/L   Chloride 103 96 - 106 mmol/L   CO2 25 20 - 29 mmol/L   Calcium 9.3 8.6 - 10.2 mg/dL  LP+ALT+AST Piccolo, Waived  Result Value Ref Range   ALT (SGPT) Piccolo, Waived 31 10 - 47 U/L   AST (SGOT) Piccolo, Waived 33 11 - 38 U/L   Cholesterol Piccolo, Waived 160 <200 mg/dL   HDL Chol Piccolo, Waived 43 (L) >59 mg/dL    Triglycerides Piccolo,Waived 229 (H) <150 mg/dL   Chol/HDL Ratio Piccolo,Waive 3.8 mg/dL   LDL Chol Calc Piccolo Waived 71 <100 mg/dL   VLDL Chol Calc Piccolo,Waive 46 (H) <30 mg/dL  Assessment & Plan:   Problem List Items Addressed This Visit      Cardiovascular and Mediastinum   Essential hypertension, benign    Chronic, stable with BP at goal in office and home. Continue current medication regimen and adjust as needed.  Labs today.  Return in 6 months for follow-up.        Relevant Orders   CBC with Differential/Platelet   CAD (coronary artery disease)    Continue Plavix and Vytorin for prevention.        Genitourinary   BPH (benign prostatic hyperplasia)    Chronic, stable.  Continue current medication regimen as is effective and no symptoms.          Other   Hyperlipemia    Chronic, ongoing.  Continue current medication regimen and adjust as needed.  Labs today.        Relevant Orders   Comprehensive metabolic panel   Lipid Panel w/o Chol/HDL Ratio   Gout    Chronic, stable.  Continue current medication regimen.  Labs today.      Relevant Orders   Uric acid   Anemia, unspecified    CBC today       Other Visit Diagnoses    Encounter for annual physical exam    -  Primary   Relevant Orders   CBC with Differential/Platelet   TSH   PSA   Magnesium       Discussed aspirin prophylaxis for myocardial infarction prevention and decision was it was not indicated, taking Plavix.  LABORATORY TESTING:  Health maintenance labs ordered today as discussed above.   The natural history of prostate cancer and ongoing controversy regarding screening and potential treatment outcomes of prostate cancer has been discussed with the patient. The meaning of a false positive PSA and a false negative PSA has been discussed. He indicates understanding of the limitations of this screening test and wishes to proceed with screening PSA testing.   IMMUNIZATIONS:   - Tdap:  Tetanus vaccination status reviewed: last tetanus booster within 10 years. - Influenza: Up to date - Pneumovax: Administered today - Prevnar: Up to date - Zostavax vaccine: Up to date  SCREENING: - Colonoscopy: Up to date  Discussed with patient purpose of the colonoscopy is to detect colon cancer at curable precancerous or early stages   - AAA Screening: Not applicable  -Hearing Test: Not applicable  -Spirometry: Not applicable   PATIENT COUNSELING:    Sexuality: Discussed sexually transmitted diseases, partner selection, use of condoms, avoidance of unintended pregnancy  and contraceptive alternatives.   Advised to avoid cigarette smoking.  I discussed with the patient that most people either abstain from alcohol or drink within safe limits (<=14/week and <=4 drinks/occasion for males, <=7/weeks and <= 3 drinks/occasion for females) and that the risk for alcohol disorders and other health effects rises proportionally with the number of drinks per week and how often a drinker exceeds daily limits.  Discussed cessation/primary prevention of drug use and availability of treatment for abuse.   Diet: Encouraged to adjust caloric intake to maintain  or achieve ideal body weight, to reduce intake of dietary saturated fat and total fat, to limit sodium intake by avoiding high sodium foods and not adding table salt, and to maintain adequate dietary potassium and calcium preferably from fresh fruits, vegetables, and low-fat dairy products.    stressed the importance of regular exercise  Injury prevention: Discussed safety belts, safety helmets, smoke detector, smoking near bedding or upholstery.  Dental health: Discussed importance of regular tooth brushing, flossing, and dental visits.   Follow up plan: NEXT PREVENTATIVE PHYSICAL DUE IN 1 YEAR. Return in about 6 months (around 10/01/2019) for Follow-up chronic disease.

## 2019-03-31 NOTE — Assessment & Plan Note (Signed)
Chronic, stable with BP at goal in office and home. Continue current medication regimen and adjust as needed.  Labs today.  Return in 6 months for follow-up.

## 2019-03-31 NOTE — Patient Instructions (Addendum)
DASH Eating Plan DASH stands for "Dietary Approaches to Stop Hypertension." The DASH eating plan is a healthy eating plan that has been shown to reduce high blood pressure (hypertension). It may also reduce your risk for type 2 diabetes, heart disease, and stroke. The DASH eating plan may also help with weight loss. What are tips for following this plan?  General guidelines  Avoid eating more than 2,300 mg (milligrams) of salt (sodium) a day. If you have hypertension, you may need to reduce your sodium intake to 1,500 mg a day.  Limit alcohol intake to no more than 1 drink a day for nonpregnant women and 2 drinks a day for men. One drink equals 12 oz of beer, 5 oz of wine, or 1 oz of hard liquor.  Work with your health care provider to maintain a healthy body weight or to lose weight. Ask what an ideal weight is for you.  Get at least 30 minutes of exercise that causes your heart to beat faster (aerobic exercise) most days of the week. Activities may include walking, swimming, or biking.  Work with your health care provider or diet and nutrition specialist (dietitian) to adjust your eating plan to your individual calorie needs. Reading food labels   Check food labels for the amount of sodium per serving. Choose foods with less than 5 percent of the Daily Value of sodium. Generally, foods with less than 300 mg of sodium per serving fit into this eating plan.  To find whole grains, look for the word "whole" as the first word in the ingredient list. Shopping  Buy products labeled as "low-sodium" or "no salt added."  Buy fresh foods. Avoid canned foods and premade or frozen meals. Cooking  Avoid adding salt when cooking. Use salt-free seasonings or herbs instead of table salt or sea salt. Check with your health care provider or pharmacist before using salt substitutes.  Do not fry foods. Cook foods using healthy methods such as baking, boiling, grilling, and broiling instead.  Cook with  heart-healthy oils, such as olive, canola, soybean, or sunflower oil. Meal planning  Eat a balanced diet that includes: ? 5 or more servings of fruits and vegetables each day. At each meal, try to fill half of your plate with fruits and vegetables. ? Up to 6-8 servings of whole grains each day. ? Less than 6 oz of lean meat, poultry, or fish each day. A 3-oz serving of meat is about the same size as a deck of cards. One egg equals 1 oz. ? 2 servings of low-fat dairy each day. ? A serving of nuts, seeds, or beans 5 times each week. ? Heart-healthy fats. Healthy fats called Omega-3 fatty acids are found in foods such as flaxseeds and coldwater fish, like sardines, salmon, and mackerel.  Limit how much you eat of the following: ? Canned or prepackaged foods. ? Food that is high in trans fat, such as fried foods. ? Food that is high in saturated fat, such as fatty meat. ? Sweets, desserts, sugary drinks, and other foods with added sugar. ? Full-fat dairy products.  Do not salt foods before eating.  Try to eat at least 2 vegetarian meals each week.  Eat more home-cooked food and less restaurant, buffet, and fast food.  When eating at a restaurant, ask that your food be prepared with less salt or no salt, if possible. What foods are recommended? The items listed may not be a complete list. Talk with your dietitian about   what dietary choices are best for you. Grains Whole-grain or whole-wheat bread. Whole-grain or whole-wheat pasta. Brown rice. Oatmeal. Quinoa. Bulgur. Whole-grain and low-sodium cereals. Pita bread. Low-fat, low-sodium crackers. Whole-wheat flour tortillas. Vegetables Fresh or frozen vegetables (raw, steamed, roasted, or grilled). Low-sodium or reduced-sodium tomato and vegetable juice. Low-sodium or reduced-sodium tomato sauce and tomato paste. Low-sodium or reduced-sodium canned vegetables. Fruits All fresh, dried, or frozen fruit. Canned fruit in natural juice (without  added sugar). Meat and other protein foods Skinless chicken or turkey. Ground chicken or turkey. Pork with fat trimmed off. Fish and seafood. Egg whites. Dried beans, peas, or lentils. Unsalted nuts, nut butters, and seeds. Unsalted canned beans. Lean cuts of beef with fat trimmed off. Low-sodium, lean deli meat. Dairy Low-fat (1%) or fat-free (skim) milk. Fat-free, low-fat, or reduced-fat cheeses. Nonfat, low-sodium ricotta or cottage cheese. Low-fat or nonfat yogurt. Low-fat, low-sodium cheese. Fats and oils Soft margarine without trans fats. Vegetable oil. Low-fat, reduced-fat, or light mayonnaise and salad dressings (reduced-sodium). Canola, safflower, olive, soybean, and sunflower oils. Avocado. Seasoning and other foods Herbs. Spices. Seasoning mixes without salt. Unsalted popcorn and pretzels. Fat-free sweets. What foods are not recommended? The items listed may not be a complete list. Talk with your dietitian about what dietary choices are best for you. Grains Baked goods made with fat, such as croissants, muffins, or some breads. Dry pasta or rice meal packs. Vegetables Creamed or fried vegetables. Vegetables in a cheese sauce. Regular canned vegetables (not low-sodium or reduced-sodium). Regular canned tomato sauce and paste (not low-sodium or reduced-sodium). Regular tomato and vegetable juice (not low-sodium or reduced-sodium). Pickles. Olives. Fruits Canned fruit in a light or heavy syrup. Fried fruit. Fruit in cream or butter sauce. Meat and other protein foods Fatty cuts of meat. Ribs. Fried meat. Bacon. Sausage. Bologna and other processed lunch meats. Salami. Fatback. Hotdogs. Bratwurst. Salted nuts and seeds. Canned beans with added salt. Canned or smoked fish. Whole eggs or egg yolks. Chicken or turkey with skin. Dairy Whole or 2% milk, cream, and half-and-half. Whole or full-fat cream cheese. Whole-fat or sweetened yogurt. Full-fat cheese. Nondairy creamers. Whipped toppings.  Processed cheese and cheese spreads. Fats and oils Butter. Stick margarine. Lard. Shortening. Ghee. Bacon fat. Tropical oils, such as coconut, palm kernel, or palm oil. Seasoning and other foods Salted popcorn and pretzels. Onion salt, garlic salt, seasoned salt, table salt, and sea salt. Worcestershire sauce. Tartar sauce. Barbecue sauce. Teriyaki sauce. Soy sauce, including reduced-sodium. Steak sauce. Canned and packaged gravies. Fish sauce. Oyster sauce. Cocktail sauce. Horseradish that you find on the shelf. Ketchup. Mustard. Meat flavorings and tenderizers. Bouillon cubes. Hot sauce and Tabasco sauce. Premade or packaged marinades. Premade or packaged taco seasonings. Relishes. Regular salad dressings. Where to find more information:  National Heart, Lung, and Blood Institute: www.nhlbi.nih.gov  American Heart Association: www.heart.org Summary  The DASH eating plan is a healthy eating plan that has been shown to reduce high blood pressure (hypertension). It may also reduce your risk for type 2 diabetes, heart disease, and stroke.  With the DASH eating plan, you should limit salt (sodium) intake to 2,300 mg a day. If you have hypertension, you may need to reduce your sodium intake to 1,500 mg a day.  When on the DASH eating plan, aim to eat more fresh fruits and vegetables, whole grains, lean proteins, low-fat dairy, and heart-healthy fats.  Work with your health care provider or diet and nutrition specialist (dietitian) to adjust your eating plan to your   individual calorie needs. This information is not intended to replace advice given to you by your health care provider. Make sure you discuss any questions you have with your health care provider. Document Released: 08/02/2011 Document Revised: 07/26/2017 Document Reviewed: 08/06/2016 Elsevier Patient Education  [place]. Pneumococcal Polysaccharide Vaccine (PPSV23): What You Need to Know 1. Why get vaccinated? Pneumococcal  polysaccharide vaccine (PPSV23) can prevent pneumococcal disease. Pneumococcal disease refers to any illness caused by pneumococcal bacteria. These bacteria can cause many types of illnesses, including pneumonia, which is an infection of the lungs. Pneumococcal bacteria are one of the most common causes of pneumonia. Besides pneumonia, pneumococcal bacteria can also cause:  Ear infections  Sinus infections  Meningitis (infection of the tissue covering the brain and spinal cord)  Bacteremia (bloodstream infection) Anyone can get pneumococcal disease, but children under 53 years of age, people with certain medical conditions, adults 58 years or older, and cigarette smokers are at the highest risk. Most pneumococcal infections are mild. However, some can result in long-term problems, such as brain damage or hearing loss. Meningitis, bacteremia, and pneumonia caused by pneumococcal disease can be fatal. 2. PPSV23 PPSV23 protects against 23 types of bacteria that cause pneumococcal disease. PPSV23 is recommended for:  All adults 67 years or older,  Anyone 2 years or older with certain medical conditions that can lead to an increased risk for pneumococcal disease. Most people need only one dose of PPSV23. A second dose of PPSV23, and another type of pneumococcal vaccine called PCV13, are recommended for certain high-risk groups. Your health care provider can give you more information. People 65 years or older should get a dose of PPSV23 even if they have already gotten one or more doses of the vaccine before they turned 65. 3. Talk with your health care provider Tell your vaccine provider if the person getting the vaccine:  Has had an allergic reaction after a previous dose of PPSV23, or has any severe, life-threatening allergies. In some cases, your health care provider may decide to postpone PPSV23 vaccination to a future visit. People with minor illnesses, such as a cold, may be vaccinated.  People who are moderately or severely ill should usually wait until they recover before getting PPSV23. Your health care provider can give you more information. 4. Risks of a vaccine reaction  Redness or pain where the shot is given, feeling tired, fever, or muscle aches can happen after PPSV23. People sometimes faint after medical procedures, including vaccination. Tell your provider if you feel dizzy or have vision changes or ringing in the ears. As with any medicine, there is a very remote chance of a vaccine causing a severe allergic reaction, other serious injury, or death. 5. What if there is a serious problem? An allergic reaction could occur after the vaccinated person leaves the clinic. If you see signs of a severe allergic reaction (hives, swelling of the face and throat, difficulty breathing, a fast heartbeat, dizziness, or weakness), call 9-1-1 and get the person to the nearest hospital. For other signs that concern you, call your health care provider. Adverse reactions should be reported to the Vaccine Adverse Event Reporting System (VAERS). Your health care provider will usually file this report, or you can do it yourself. Visit the VAERS website at www.vaers.SamedayNews.es or call 719-113-6122. VAERS is only for reporting reactions, and VAERS staff do not give medical advice. 6. How can I learn more?  Ask your health care provider.  Call your local or state  health department.  Contact the Centers for Disease Control and Prevention (CDC): ? Call 614 485 5555 (1-800-CDC-INFO) or ? Visit CDC's website at http://hunter.com/ CDC Vaccine Information Statement PPSV23 Vaccine (06/25/2018) This information is not intended to replace advice given to you by your health care provider. Make sure you discuss any questions you have with your health care provider. Document Released: 06/10/2006 Document Revised: 12/02/2018 Document Reviewed: 03/25/2018 Elsevier Patient Education  2020 [employer].

## 2019-04-01 LAB — COMPREHENSIVE METABOLIC PANEL
ALT: 29 IU/L (ref 0–44)
AST: 23 IU/L (ref 0–40)
Albumin/Globulin Ratio: 1.9 (ref 1.2–2.2)
Albumin: 4.4 g/dL (ref 3.8–4.8)
Alkaline Phosphatase: 53 IU/L (ref 39–117)
BUN/Creatinine Ratio: 14 (ref 10–24)
BUN: 15 mg/dL (ref 8–27)
Bilirubin Total: 0.3 mg/dL (ref 0.0–1.2)
CO2: 25 mmol/L (ref 20–29)
Calcium: 9.6 mg/dL (ref 8.6–10.2)
Chloride: 103 mmol/L (ref 96–106)
Creatinine, Ser: 1.08 mg/dL (ref 0.76–1.27)
GFR calc Af Amer: 80 mL/min/{1.73_m2} (ref >59)
GFR calc non Af Amer: 69 mL/min/{1.73_m2} (ref >59)
Globulin, Total: 2.3 g/dL (ref 1.5–4.5)
Glucose: 91 mg/dL (ref 65–99)
Potassium: 4.7 mmol/L (ref 3.5–5.2)
Sodium: 142 mmol/L (ref 134–144)
Total Protein: 6.7 g/dL (ref 6.0–8.5)

## 2019-04-01 LAB — CBC WITH DIFFERENTIAL/PLATELET
Basophils Absolute: 0 10*3/uL (ref 0.0–0.2)
Basos: 0 %
EOS (ABSOLUTE): 0.2 10*3/uL (ref 0.0–0.4)
Eos: 4 %
Hematocrit: 37.8 % (ref 37.5–51.0)
Hemoglobin: 12.5 g/dL — ABNORMAL LOW (ref 13.0–17.7)
Immature Grans (Abs): 0 10*3/uL (ref 0.0–0.1)
Immature Granulocytes: 1 %
Lymphocytes Absolute: 2.2 10*3/uL (ref 0.7–3.1)
Lymphs: 33 %
MCH: 31.8 pg (ref 26.6–33.0)
MCHC: 33.1 g/dL (ref 31.5–35.7)
MCV: 96 fL (ref 79–97)
Monocytes Absolute: 0.5 10*3/uL (ref 0.1–0.9)
Monocytes: 8 %
Neutrophils Absolute: 3.5 10*3/uL (ref 1.4–7.0)
Neutrophils: 54 %
Platelets: 238 10*3/uL (ref 150–450)
RBC: 3.93 x10E6/uL — ABNORMAL LOW (ref 4.14–5.80)
RDW: 12.8 % (ref 11.6–15.4)
WBC: 6.4 10*3/uL (ref 3.4–10.8)

## 2019-04-01 LAB — LIPID PANEL W/O CHOL/HDL RATIO
Cholesterol, Total: 156 mg/dL (ref 100–199)
HDL: 41 mg/dL (ref >39)
LDL Calculated: 83 mg/dL (ref 0–99)
Triglycerides: 158 mg/dL — ABNORMAL HIGH (ref 0–149)
VLDL Cholesterol Cal: 32 mg/dL (ref 5–40)

## 2019-04-01 LAB — URIC ACID: Uric Acid: 6.5 mg/dL (ref 3.7–8.6)

## 2019-04-01 LAB — TSH: TSH: 2.29 u[IU]/mL (ref 0.450–4.500)

## 2019-04-01 LAB — MAGNESIUM: Magnesium: 2.1 mg/dL (ref 1.6–2.3)

## 2019-04-01 LAB — PSA: Prostate Specific Ag, Serum: 1.1 ng/mL (ref 0.0–4.0)

## 2019-08-11 ENCOUNTER — Other Ambulatory Visit: Payer: Self-pay

## 2019-08-11 MED ORDER — MELOXICAM 15 MG PO TABS: 15.0000 mg | ORAL_TABLET | Freq: Every day | ORAL | 1 refills | Status: DC

## 2019-08-11 NOTE — Telephone Encounter (Signed)
Medication refill request for Meloxicam 15mg  LOV: 03/31/2019 and upcoming appt 10/06/2019 with Jolene.

## 2019-10-06 ENCOUNTER — Ambulatory Visit (INDEPENDENT_AMBULATORY_CARE_PROVIDER_SITE_OTHER): Payer: 59 | Admitting: Nurse Practitioner

## 2019-10-06 ENCOUNTER — Encounter: Payer: Self-pay | Admitting: Nurse Practitioner

## 2019-10-06 ENCOUNTER — Other Ambulatory Visit: Payer: Self-pay

## 2019-10-06 VITALS — BP 121/73 | HR 69 | Temp 98.1°F

## 2019-10-06 DIAGNOSIS — M1000 Idiopathic gout, unspecified site: Secondary | ICD-10-CM

## 2019-10-06 DIAGNOSIS — I10 Essential (primary) hypertension: Secondary | ICD-10-CM | POA: Diagnosis not present

## 2019-10-06 DIAGNOSIS — E785 Hyperlipidemia, unspecified: Secondary | ICD-10-CM | POA: Diagnosis not present

## 2019-10-06 MED ORDER — METOPROLOL SUCCINATE ER 50 MG PO TB24: 50.0000 mg | ORAL_TABLET | Freq: Every day | ORAL | 4 refills | Status: DC

## 2019-10-06 MED ORDER — CLOPIDOGREL BISULFATE 75 MG PO TABS: 75.0000 mg | ORAL_TABLET | Freq: Every day | ORAL | 3 refills | Status: DC

## 2019-10-06 MED ORDER — RABEPRAZOLE SODIUM 20 MG PO TBEC: 20.0000 mg | DELAYED_RELEASE_TABLET | Freq: Every day | ORAL | 4 refills | Status: DC

## 2019-10-06 MED ORDER — ALLOPURINOL 100 MG PO TABS: 100.0000 mg | ORAL_TABLET | Freq: Every day | ORAL | 4 refills | Status: DC

## 2019-10-06 MED ORDER — MELOXICAM 15 MG PO TABS: 15.0000 mg | ORAL_TABLET | Freq: Every day | ORAL | 1 refills | Status: DC

## 2019-10-06 MED ORDER — LISINOPRIL 20 MG PO TABS: 20.0000 mg | ORAL_TABLET | Freq: Every day | ORAL | 4 refills | Status: DC

## 2019-10-06 MED ORDER — FINASTERIDE 5 MG PO TABS: 5.0000 mg | ORAL_TABLET | Freq: Every day | ORAL | 4 refills | Status: DC

## 2019-10-06 MED ORDER — EZETIMIBE-SIMVASTATIN 10-40 MG PO TABS: 1.0000 | ORAL_TABLET | Freq: Every day | ORAL | 4 refills | Status: DC

## 2019-10-06 NOTE — Progress Notes (Signed)
BP 121/73   Pulse 69   Temp 98.1 F (36.7 C) (Oral)   SpO2 98%    Subjective:    Patient ID: Mark Quinn, male    DOB: October 31, 1945, 74 y.o.   MRN: [id number]  HPI: Mark Quinn is a 74 y.o. male  Chief Complaint  Patient presents with  . Hyperlipidemia  . Hypertension   HYPERTENSION / HYPERLIPIDEMIA Continues on Plavix, Metoprolol, Lisinopril, and Vytorin. Satisfied with current treatment? yes Duration of hypertension: chronic BP monitoring frequency: monthly BP range: 130/70-80 at work when checks BP medication side effects: no Duration of hyperlipidemia: chronic Cholesterol medication side effects: no Cholesterol supplements: none Medication compliance: good compliance Aspirin: no Recent stressors: no Recurrent headaches: no Visual changes: no Palpitations: no Dyspnea: no Chest pain: no Lower extremity edema: no Dizzy/lightheaded: no  Relevant past medical, surgical, family and social history reviewed and updated as indicated. Interim medical history since our last visit reviewed. Allergies and medications reviewed and updated.  Review of Systems  Constitutional: Negative for activity change, diaphoresis, fatigue and fever.  Respiratory: Negative for cough, chest tightness, shortness of breath and wheezing.   Cardiovascular: Negative for chest pain, palpitations and leg swelling.  Gastrointestinal: Negative.   Endocrine: Negative.   Neurological: Negative.   Psychiatric/Behavioral: Negative.     Per HPI unless specifically indicated above     Objective:    BP 121/73   Pulse 69   Temp 98.1 F (36.7 C) (Oral)   SpO2 98%   Wt Readings from Last 3 Encounters:  03/31/19 190 lb (86.2 kg)  12/03/18 195 lb (88.5 kg)  05/29/18 194 lb (88 kg)    Physical Exam Vitals and nursing note reviewed.  Constitutional:      General: He is awake. He is not in acute distress.    Appearance: He is well-developed and overweight. He is not ill-appearing.  HENT:      Head: Normocephalic and atraumatic.     Right Ear: Hearing normal. No drainage.     Left Ear: Hearing normal. No drainage.  Eyes:     General: Lids are normal.        Right eye: No discharge.        Left eye: No discharge.     Conjunctiva/sclera: Conjunctivae normal.     Pupils: Pupils are equal, round, and reactive to light.  Neck:     Thyroid: No thyromegaly.     Vascular: No carotid bruit.  Cardiovascular:     Rate and Rhythm: Normal rate and regular rhythm.     Heart sounds: Normal heart sounds, S1 normal and S2 normal. No murmur. No gallop.   Pulmonary:     Effort: Pulmonary effort is normal. No accessory muscle usage or respiratory distress.     Breath sounds: Normal breath sounds.  Abdominal:     General: Bowel sounds are normal.     Palpations: Abdomen is soft.  Musculoskeletal:        General: Normal range of motion.     Cervical back: Normal range of motion and neck supple.     Right lower leg: No edema.     Left lower leg: No edema.  Skin:    General: Skin is warm and dry.     Capillary Refill: Capillary refill takes less than 2 seconds.  Neurological:     Mental Status: He is alert and oriented to person, place, and time.  Psychiatric:  Attention and Perception: Attention normal.        Mood and Affect: Mood normal.        Speech: Speech normal.        Behavior: Behavior normal. Behavior is cooperative.     Results for orders placed or performed in visit on 03/31/19  CBC with Differential/Platelet  Result Value Ref Range   WBC 6.4 3.4 - 10.8 x10E3/uL   RBC 3.93 (L) 4.14 - 5.80 x10E6/uL   Hemoglobin 12.5 (L) 13.0 - 17.7 g/dL   Hematocrit 37.8 37.5 - 51.0 %   MCV 96 79 - 97 fL   MCH 31.8 26.6 - 33.0 pg   MCHC 33.1 31.5 - 35.7 g/dL   RDW 12.8 11.6 - 15.4 %   Platelets 238 150 - 450 x10E3/uL   Neutrophils 54 Not Estab. %   Lymphs 33 Not Estab. %   Monocytes 8 Not Estab. %   Eos 4 Not Estab. %   Basos 0 Not Estab. %   Neutrophils Absolute 3.5  1.4 - 7.0 x10E3/uL   Lymphocytes Absolute 2.2 0.7 - 3.1 x10E3/uL   Monocytes Absolute 0.5 0.1 - 0.9 x10E3/uL   EOS (ABSOLUTE) 0.2 0.0 - 0.4 x10E3/uL   Basophils Absolute 0.0 0.0 - 0.2 x10E3/uL   Immature Granulocytes 1 Not Estab. %   Immature Grans (Abs) 0.0 0.0 - 0.1 x10E3/uL  Comprehensive metabolic panel  Result Value Ref Range   Glucose 91 65 - 99 mg/dL   BUN 15 8 - 27 mg/dL   Creatinine, Ser 1.08 0.76 - 1.27 mg/dL   GFR calc non Af Amer 69 >59 mL/min/1.73   GFR calc Af Amer 80 >59 mL/min/1.73   BUN/Creatinine Ratio 14 10 - 24   Sodium 142 134 - 144 mmol/L   Potassium 4.7 3.5 - 5.2 mmol/L   Chloride 103 96 - 106 mmol/L   CO2 25 20 - 29 mmol/L   Calcium 9.6 8.6 - 10.2 mg/dL   Total Protein 6.7 6.0 - 8.5 g/dL   Albumin 4.4 3.8 - 4.8 g/dL   Globulin, Total 2.3 1.5 - 4.5 g/dL   Albumin/Globulin Ratio 1.9 1.2 - 2.2   Bilirubin Total 0.3 0.0 - 1.2 mg/dL   Alkaline Phosphatase 53 39 - 117 IU/L   AST 23 0 - 40 IU/L   ALT 29 0 - 44 IU/L  Lipid Panel w/o Chol/HDL Ratio  Result Value Ref Range   Cholesterol, Total 156 100 - 199 mg/dL   Triglycerides 158 (H) 0 - 149 mg/dL   HDL 41 >39 mg/dL   VLDL Cholesterol Cal 32 5 - 40 mg/dL   LDL Calculated 83 0 - 99 mg/dL  TSH  Result Value Ref Range   TSH 2.290 0.450 - 4.500 uIU/mL  PSA  Result Value Ref Range   Prostate Specific Ag, Serum 1.1 0.0 - 4.0 ng/mL  Magnesium  Result Value Ref Range   Magnesium 2.1 1.6 - 2.3 mg/dL  Uric acid  Result Value Ref Range   Uric Acid 6.5 3.7 - 8.6 mg/dL      Assessment & Plan:   Problem List Items Addressed This Visit      Cardiovascular and Mediastinum   Essential hypertension, benign - Primary    Chronic, stable with BP at goal in office and home. Continue current medication regimen and adjust as needed.  Labs today.  Return in 6 months for physical.  Refills sent.      Relevant Medications   metoprolol succinate (  TOPROL-XL) 50 MG 24 hr tablet   lisinopril (ZESTRIL) 20 MG tablet    ezetimibe-simvastatin (VYTORIN) 10-40 MG tablet     Other   Hyperlipemia    Chronic, ongoing.  Continue current medication regimen and adjust as needed.  Labs today.  Refills sent.  Return in 6 months for physical.      Relevant Medications   metoprolol succinate (TOPROL-XL) 50 MG 24 hr tablet   lisinopril (ZESTRIL) 20 MG tablet   ezetimibe-simvastatin (VYTORIN) 10-40 MG tablet   Other Relevant Orders   Lipid Panel w/o Chol/HDL Ratio   Basic Metabolic Panel (BMET)   Gout   Relevant Medications   allopurinol (ZYLOPRIM) 100 MG tablet       Follow up plan: Return in about 6 months (around 04/04/2020) for Annual physical.

## 2019-10-06 NOTE — Assessment & Plan Note (Signed)
Chronic, ongoing.  Continue current medication regimen and adjust as needed.  Labs today.  Refills sent.  Return in 6 months for physical.

## 2019-10-06 NOTE — Patient Instructions (Signed)
Melatonin oral solid dosage forms What is this medicine? MELATONIN (mel uh TOH nin) is a dietary supplement. It is mostly promoted to help maintain normal sleep patterns. The FDA has not approved this supplement for any medical use. This supplement may be used for other purposes; ask your health care provider or pharmacist if you have questions. This medicine may be used for other purposes; ask your health care provider or pharmacist if you have questions. COMMON BRAND NAME(S): Melatonex What should I tell my health care provider before I take this medicine? They need to know if you have any of these conditions:  cancer  depression or mental illness  diabetes  hormone problems  if you often drink alcohol  immune system problems  liver disease  lung or breathing disease, like asthma  organ transplant  seizure disorder  an unusual or allergic reaction to melatonin, other medicines, foods, dyes, or preservatives  pregnant or trying to get pregnant  breast-feeding How should I use this medicine? Take this supplement by mouth with a glass of water. Do not take with food. This supplement is usually taken 1 or 2 hours before bedtime. After taking this supplement, limit your activities to those needed to prepare for bed. Some products may be chewed or dissolved in the mouth before swallowing. Some tablets or capsules must be swallowed whole; do not cut, crush or chew. Follow the directions on the package labeling, or take as directed by your health care professional. Do not take this supplement more often than directed. Talk to your pediatrician regarding the use of this supplement in children. Special care may be needed. This supplement is not recommended for use in children without a prescription. Overdosage: If you think you have taken too much of this medicine contact a poison control center or emergency room at once. NOTE: This medicine is only for you. Do not share this medicine  with others. What if I miss a dose? If you miss taking your dose at the usual time, skip that dose. If it is almost time for your next dose, take only that dose. Do not take double or extra doses. What may interact with this medicine? Do not take this medicine with any of the following medications:  fluvoxamine  ramelteon  tasimelteon This medicine may also interact with the following medications:  alcohol  caffeine  carbamazepine  certain antibiotics like ciprofloxacin  certain medicines for depression, anxiety, or psychotic disturbances  cimetidine  male hormones, like estrogens and birth control pills, patches, rings, or injections  methoxsalen  nifedipine  other medications for sleep  other herbal or dietary supplements  phenobarbital  rifampin  smoking tobacco  tamoxifen  warfarin This list may not describe all possible interactions. Give your health care provider a list of all the medicines, herbs, non-prescription drugs, or dietary supplements you use. Also tell them if you smoke, drink alcohol, or use illegal drugs. Some items may interact with your medicine. What should I watch for while using this medicine? See your doctor if your symptoms do not get better or if they get worse. Do not take this supplement for more than 2 weeks unless your doctor tells you to. You may get drowsy or dizzy. Do not drive, use machinery, or do anything that needs mental alertness until you know how this medicine affects you. Do not stand or sit up quickly, especially if you are an older patient. This reduces the risk of dizzy or fainting spells. Alcohol may interfere with   the effect of this medicine. Avoid alcoholic drinks. After taking this medicine, you may get up out of bed and do an activity that you do not know you are doing. The next morning, you may have no memory of this. Activities include driving a car ("sleep-driving"), making and eating food, talking on the phone,  sexual activity, and sleep-walking. Serious injuries have occurred. Call your doctor right away if you find out you have done any of these activities. Do not take this medicine if you have used alcohol that evening. Do not take it if you have taken another medicine for sleep. The risk of doing these sleep-related activities is higher. Talk to your doctor before you use this supplement if you are currently being treated for an emotional, mental, or sleep problem. This medicine may interfere with your treatment. Herbal or dietary supplements are not regulated like medicines. Rigid quality control standards are not required for dietary supplements. The purity and strength of these products can vary. The safety and effect of this dietary supplement for a certain disease or illness is not well known. This product is not intended to diagnose, treat, cure or prevent any disease. The Food and Drug Administration suggests the following to help consumers protect themselves:  Always read product labels and follow directions.  Natural does not mean a product is safe for humans to take.  Look for products that include USP after the ingredient name. This means that the manufacturer followed the standards of the [place] Pharmacopoeia.  Supplements made or sold by a nationally known food or drug company are more likely to be made under tight controls. You can write to the company for more information about how the product was made. What side effects may I notice from receiving this medicine? Side effects that you should report to your doctor or health care professional as soon as possible:  allergic reactions like skin rash, itching or hives, swelling of the face, lips, or tongue  breathing problems  confusion  depressed mood, irritable, or other changes in moods or behaviors  feeling faint or lightheaded, falls  increased blood pressure  irregular or missed menstrual periods  signs and symptoms of liver  injury like dark yellow or brown urine; general ill feeling or flu-like symptoms; light-colored stools; loss of appetite; nausea; right upper belly pain; unusually weak or tired; yellowing of the eyes or skin  trouble staying awake or alert during the day  unusual activities while you are still asleep like driving, eating, making phone calls  unusual bleeding or bruising Side effects that usually do not require medical attention (report to your doctor or health care professional if they continue or are bothersome):  dizziness  drowsiness  headache  hot flashes  nausea  tiredness  unusual dreams or nightmares  upset stomach This list may not describe all possible side effects. Call your doctor for medical advice about side effects. You may report side effects to FDA at 1-800-FDA-1088. Where should I keep my medicine? Keep out of the reach of children. Store at room temperature or as directed on the package label. Protect from moisture. Throw away any unused supplement after the expiration date. NOTE: This sheet is a summary. It may not cover all possible information. If you have questions about this medicine, talk to your doctor, pharmacist, or health care provider.  2020 Elsevier/Gold Standard (2018-02-07 12:11:58)  

## 2019-10-06 NOTE — Assessment & Plan Note (Signed)
Chronic, stable with BP at goal in office and home. Continue current medication regimen and adjust as needed.  Labs today.  Return in 6 months for physical.  Refills sent.

## 2019-10-07 LAB — LIPID PANEL W/O CHOL/HDL RATIO
Cholesterol, Total: 162 mg/dL (ref 100–199)
HDL: 44 mg/dL (ref >39)
LDL Chol Calc (NIH): 84 mg/dL (ref 0–99)
Triglycerides: 203 mg/dL — ABNORMAL HIGH (ref 0–149)
VLDL Cholesterol Cal: 34 mg/dL (ref 5–40)

## 2019-10-07 LAB — BASIC METABOLIC PANEL
BUN/Creatinine Ratio: 13 (ref 10–24)
BUN: 16 mg/dL (ref 8–27)
CO2: 22 mmol/L (ref 20–29)
Calcium: 9.5 mg/dL (ref 8.6–10.2)
Chloride: 104 mmol/L (ref 96–106)
Creatinine, Ser: 1.27 mg/dL (ref 0.76–1.27)
GFR calc Af Amer: 65 mL/min/{1.73_m2} (ref >59)
GFR calc non Af Amer: 56 mL/min/{1.73_m2} — ABNORMAL LOW (ref >59)
Glucose: 90 mg/dL (ref 65–99)
Potassium: 4.4 mmol/L (ref 3.5–5.2)
Sodium: 141 mmol/L (ref 134–144)

## 2019-10-07 NOTE — Progress Notes (Signed)
Contacted via MyChart

## 2019-11-18 ENCOUNTER — Other Ambulatory Visit: Payer: Self-pay

## 2019-11-18 ENCOUNTER — Ambulatory Visit (INDEPENDENT_AMBULATORY_CARE_PROVIDER_SITE_OTHER): Payer: 59 | Admitting: Cardiovascular Disease

## 2019-11-18 ENCOUNTER — Encounter: Payer: Self-pay | Admitting: Cardiovascular Disease

## 2019-11-18 VITALS — BP 140/90 | HR 72 | Ht 70.0 in | Wt 195.1 lb

## 2019-11-18 DIAGNOSIS — I25118 Atherosclerotic heart disease of native coronary artery with other forms of angina pectoris: Secondary | ICD-10-CM

## 2019-11-18 DIAGNOSIS — I10 Essential (primary) hypertension: Secondary | ICD-10-CM | POA: Diagnosis not present

## 2019-11-18 DIAGNOSIS — E782 Mixed hyperlipidemia: Secondary | ICD-10-CM

## 2019-11-18 MED ORDER — ROSUVASTATIN CALCIUM 40 MG PO TABS: 40.0000 mg | ORAL_TABLET | Freq: Every day | ORAL | 3 refills | Status: DC

## 2019-11-18 MED ORDER — EZETIMIBE 10 MG PO TABS: 10.0000 mg | ORAL_TABLET | Freq: Every day | ORAL | 3 refills | Status: DC

## 2019-11-18 NOTE — Patient Instructions (Addendum)
Medication Instructions:  When you run out of the vytorin, Stop the medication  Start zetia 10 mg daily Also start crestor 40 mg daily This is a stronger combination Goal <70     If you need a refill on your cardiac medications before your next appointment, please call your pharmacy.    Lab work: No new labs needed   If you have labs (blood work) drawn today and your tests are completely normal, you will receive your results only by: Marland Kitchen MyChart Message (if you have MyChart) OR . A paper copy in the mail If you have any lab test that is abnormal or we need to change your treatment, we will call you to review the results.   Testing/Procedures: No new testing needed   Follow-Up: At [facility], you and your health needs are our priority.  As part of our continuing mission to provide you with exceptional heart care, we have created designated Provider Care Teams.  These Care Teams include your primary Cardiologist (physician) and Advanced Practice Providers (APPs -  Physician Assistants and Nurse Practitioners) who all work together to provide you with the care you need, when you need it.  . You will need a follow up appointment in 12 months   . Providers on your designated Care Team:   . Murray Hodgkins, NP . Christell Faith, PA-C . Marrianne Mood, PA-C  Any Other Special Instructions Will Be Listed Below (If Applicable).  For educational health videos Log in to : [email or web address] Or : [email or web address], password : [email or web address]

## 2019-11-18 NOTE — Progress Notes (Signed)
Cardiology Office Note  Date:  11/18/2019   ID:  ADMIR CANDELAS, DOB [birth year], MRN [id number]  PCP:  Guadalupe Maple, MD   Chief Complaint  Patient presents with  . office visit    Pt has no complaints. Meds verbally reviewed w/ pt.    HPI:  [name] is a 74 yo gentleman with past medical history of Hypertension Hyperlipidemia Cad, stent x 2,   2006 Referred by Dr. Golden Pop for CAD  Feels well, denies any significant chest pain on exertion Rare SOB, with heavy exertion No regular exercise program  Works third shift, up all night BP well controlled at home Mildly elevated on today's visit  Echo 02/2018, reviewed Left ventricle: The cavity size was normal. Systolic function was  normal. The estimated ejection fraction was in the range of 60%  to 65%. Wall motion was normal; there were no regional wall  motion abnormalities.  - Aortic valve: There was very mild stenosis.  - Murmur likely from aortic valve sclerosis without significant  stenosis.   Lab work reviewed Total cholesterol 162 LDL 84 in February 2021  EKG personally reviewed by myself on todays visit Shows normal sinus rhythm with rate 77 bpm first-degree AV block no significant ST-T wave abnormality Previous angina sx: Nausea, sick feeling Stress test, positive 11/2004 cath Elective PCI to RCA and ramus. JR4 guide. pRCA predilated with a 2.5x15 Sprinter and a 3.5x23 and 3.5x13 Cypher was deployed at 79 Atm and post dilated with a 3.75x20 Hysail at 16 atm. Next the ramus was directly  stented with a 4.0x12 Driver to [place]. Post PCI shots reveal good  results and we used the StarClose device for closure.   PMH:   has a past medical history of CAD (coronary artery disease), Cellulitis, Gout, Melanoma ([place]) (06/2013), and Obesity.  PSH:    Past Surgical History:  Procedure Laterality Date  . CARDIAC CATHETERIZATION  2006   [facility]  . CORONARY STENT PLACEMENT  2006    CYPHER drug eluting  Stent RX 3.50 X 73m/CYPHER drug eluting Stent RX 3.50 X 258m   Current Outpatient Medications  Medication Sig Dispense Refill  . allopurinol (ZYLOPRIM) 100 MG tablet Take 1 tablet (100 mg total) by mouth daily. 90 tablet 4  . clopidogrel (PLAVIX) 75 MG tablet Take 1 tablet (75 mg total) by mouth daily. 90 tablet 3  . diphenhydrAMINE (SOMINEX) 25 MG tablet Take 25 mg by mouth at bedtime as needed for sleep.    . diphenhydramine-acetaminophen (TYLENOL PM) 25-500 MG TABS tablet Take 1 tablet by mouth at bedtime as needed.    . ezetimibe-simvastatin (VYTORIN) 10-40 MG tablet Take 1 tablet by mouth daily. 90 tablet 4  . finasteride (PROSCAR) 5 MG tablet Take 1 tablet (5 mg total) by mouth daily. 90 tablet 4  . lisinopril (ZESTRIL) 20 MG tablet Take 1 tablet (20 mg total) by mouth daily. 90 tablet 4  . meloxicam (MOBIC) 15 MG tablet Take 1 tablet (15 mg total) by mouth daily. 90 tablet 1  . methocarbamol (ROBAXIN) 500 MG tablet Take 500 mg by mouth 4 (four) times daily as needed.  1  . metoprolol succinate (TOPROL-XL) 50 MG 24 hr tablet Take 1 tablet (50 mg total) by mouth daily. Take with or immediately following a meal. 90 tablet 4  . Multiple Vitamin (MULTIVITAMIN) tablet Take 1 tablet by mouth daily.    . RABEprazole (ACIPHEX) 20 MG tablet Take 1 tablet (20 mg  total) by mouth daily. 90 tablet 4  . ezetimibe (ZETIA) 10 MG tablet Take 1 tablet (10 mg total) by mouth daily. 90 tablet 3  . rosuvastatin (CRESTOR) 40 MG tablet Take 1 tablet (40 mg total) by mouth daily. 90 tablet 3   No current facility-administered medications for this visit.     Allergies:   Other   Social History:  The patient  reports that he quit smoking about 44 years ago. His smoking use included cigarettes. He has a 8.00 pack-year smoking history. He has never used smokeless tobacco. He reports current alcohol use. He reports that he does not use drugs.   Family History:   family history includes Diabetes in his son;  Heart disease in his mother; Hyperlipidemia in his mother.    Review of Systems: Review of Systems  Constitutional: Negative.   HENT: Negative.   Respiratory: Negative.   Cardiovascular: Negative.   Gastrointestinal: Negative.   Musculoskeletal: Negative.   Neurological: Negative.   Psychiatric/Behavioral: Negative.   All other systems reviewed and are negative.   PHYSICAL EXAM: VS:  BP 140/90 (BP Location: Left Arm, Patient Position: Sitting, Cuff Size: Normal)   Pulse 72   Ht '5\' 10"'  (1.778 m)   Wt 195 lb 2 oz (88.5 kg)   SpO2 98%   BMI 28.00 kg/m  , BMI Body mass index is 28 kg/m. GEN: Well nourished, well developed, in no acute distress  HEENT: normal  Neck: no JVD, carotid bruits, or masses Cardiac: RRR; no murmurs, rubs, or gallops,no edema  Respiratory:  clear to auscultation bilaterally, normal work of breathing GI: soft, nontender, nondistended, + BS MS: no deformity or atrophy  Skin: warm and dry, no rash Neuro:  Strength and sensation are intact Psych: euthymic mood, full affect  Recent Labs: 03/31/2019: ALT 29; Hemoglobin 12.5; Magnesium 2.1; Platelets 238; TSH 2.290 10/06/2019: BUN 16; Creatinine, Ser 1.27; Potassium 4.4; Sodium 141    Lipid Panel Lab Results  Component Value Date   CHOL 162 10/06/2019   HDL 44 10/06/2019   LDLCALC 84 10/06/2019   TRIG 203 (H) 10/06/2019      Wt Readings from Last 3 Encounters:  11/18/19 195 lb 2 oz (88.5 kg)  03/31/19 190 lb (86.2 kg)  12/03/18 195 lb (88.5 kg)       ASSESSMENT AND PLAN:  Coronary artery disease due to lipid rich plaque Prior stent to proximal RCA and ramus in 2004 Currently with no symptoms of angina. No further workup at this time.  Change to his cholesterol medication as below  Essential hypertension, benign Blood pressure mildly elevated, recommend he monitor this closely at home Likely elevated after long walk into the office, also worked all night last night  Aortic valve  disease Prior echocardiogram with aortic valve sclerosis, Low-grade murmur on today's visit, no further work-up needed  Mixed hyperlipidemia Recommend he change to Crestor 40 daily with Zetia 10 daily to achieve goal LDL less than 70 Prescriptions provided  Disposition:   F/U  12 months   Total encounter time more than 25 minutes  Greater than 50% was spent in counseling and coordination of care with the patient    Orders Placed This Encounter  Procedures  . EKG 12-Lead     Signed, Esmond Plants, M.D., Ph.D. 11/18/2019  [place], [place]

## 2020-01-18 ENCOUNTER — Other Ambulatory Visit: Payer: Self-pay | Admitting: Nurse Practitioner

## 2020-02-08 ENCOUNTER — Ambulatory Visit: Payer: 59 | Admitting: Family

## 2020-04-04 ENCOUNTER — Ambulatory Visit (INDEPENDENT_AMBULATORY_CARE_PROVIDER_SITE_OTHER): Payer: 59 | Admitting: Nurse Practitioner

## 2020-04-04 ENCOUNTER — Other Ambulatory Visit: Payer: Self-pay

## 2020-04-04 ENCOUNTER — Encounter: Payer: Self-pay | Admitting: Nurse Practitioner

## 2020-04-04 VITALS — BP 118/78 | HR 71 | Temp 98.8°F | Ht 69.0 in | Wt 191.0 lb

## 2020-04-04 DIAGNOSIS — Z6828 Body mass index (BMI) 28.0-28.9, adult: Secondary | ICD-10-CM

## 2020-04-04 DIAGNOSIS — M1000 Idiopathic gout, unspecified site: Secondary | ICD-10-CM

## 2020-04-04 DIAGNOSIS — I10 Essential (primary) hypertension: Secondary | ICD-10-CM

## 2020-04-04 DIAGNOSIS — Z0000 Encounter for general adult medical examination without abnormal findings: Secondary | ICD-10-CM

## 2020-04-04 DIAGNOSIS — Z23 Encounter for immunization: Secondary | ICD-10-CM | POA: Diagnosis not present

## 2020-04-04 DIAGNOSIS — D649 Anemia, unspecified: Secondary | ICD-10-CM

## 2020-04-04 DIAGNOSIS — I2510 Atherosclerotic heart disease of native coronary artery without angina pectoris: Secondary | ICD-10-CM | POA: Diagnosis not present

## 2020-04-04 DIAGNOSIS — K219 Gastro-esophageal reflux disease without esophagitis: Secondary | ICD-10-CM

## 2020-04-04 DIAGNOSIS — E782 Mixed hyperlipidemia: Secondary | ICD-10-CM | POA: Diagnosis not present

## 2020-04-04 DIAGNOSIS — N400 Enlarged prostate without lower urinary tract symptoms: Secondary | ICD-10-CM | POA: Diagnosis not present

## 2020-04-04 DIAGNOSIS — I2583 Coronary atherosclerosis due to lipid rich plaque: Secondary | ICD-10-CM

## 2020-04-04 MED ORDER — EZETIMIBE-SIMVASTATIN 10-40 MG PO TABS: 1.0000 | ORAL_TABLET | Freq: Every day | ORAL | 4 refills | Status: DC

## 2020-04-04 MED ORDER — CLOPIDOGREL BISULFATE 75 MG PO TABS: 75.0000 mg | ORAL_TABLET | Freq: Every day | ORAL | 4 refills | Status: DC

## 2020-04-04 MED ORDER — ALLOPURINOL 100 MG PO TABS: 100.0000 mg | ORAL_TABLET | Freq: Every day | ORAL | 4 refills | Status: DC

## 2020-04-04 MED ORDER — LISINOPRIL 20 MG PO TABS: 20.0000 mg | ORAL_TABLET | Freq: Every day | ORAL | 4 refills | Status: DC

## 2020-04-04 MED ORDER — MELOXICAM 15 MG PO TABS: 15.0000 mg | ORAL_TABLET | Freq: Every day | ORAL | 1 refills | Status: DC

## 2020-04-04 MED ORDER — METOPROLOL SUCCINATE ER 50 MG PO TB24: 50.0000 mg | ORAL_TABLET | Freq: Every day | ORAL | 4 refills | Status: DC

## 2020-04-04 MED ORDER — RABEPRAZOLE SODIUM 20 MG PO TBEC: 20.0000 mg | DELAYED_RELEASE_TABLET | Freq: Every day | ORAL | 4 refills | Status: DC

## 2020-04-04 MED ORDER — FINASTERIDE 5 MG PO TABS: 5.0000 mg | ORAL_TABLET | Freq: Every day | ORAL | 4 refills | Status: DC

## 2020-04-04 NOTE — Patient Instructions (Signed)
DASH Eating Plan DASH stands for "Dietary Approaches to Stop Hypertension." The DASH eating plan is a healthy eating plan that has been shown to reduce high blood pressure (hypertension). It may also reduce your risk for type 2 diabetes, heart disease, and stroke. The DASH eating plan may also help with weight loss. What are tips for following this plan?  General guidelines  Avoid eating more than 2,300 mg (milligrams) of salt (sodium) a day. If you have hypertension, you may need to reduce your sodium intake to 1,500 mg a day.  Limit alcohol intake to no more than 1 drink a day for nonpregnant women and 2 drinks a day for men. One drink equals 12 oz of beer, 5 oz of wine, or 1 oz of hard liquor.  Work with your health care provider to maintain a healthy body weight or to lose weight. Ask what an ideal weight is for you.  Get at least 30 minutes of exercise that causes your heart to beat faster (aerobic exercise) most days of the week. Activities may include walking, swimming, or biking.  Work with your health care provider or diet and nutrition specialist (dietitian) to adjust your eating plan to your individual calorie needs. Reading food labels   Check food labels for the amount of sodium per serving. Choose foods with less than 5 percent of the Daily Value of sodium. Generally, foods with less than 300 mg of sodium per serving fit into this eating plan.  To find whole grains, look for the word "whole" as the first word in the ingredient list. Shopping  Buy products labeled as "low-sodium" or "no salt added."  Buy fresh foods. Avoid canned foods and premade or frozen meals. Cooking  Avoid adding salt when cooking. Use salt-free seasonings or herbs instead of table salt or sea salt. Check with your health care provider or pharmacist before using salt substitutes.  Do not fry foods. Cook foods using healthy methods such as baking, boiling, grilling, and broiling instead.  Cook with  heart-healthy oils, such as olive, canola, soybean, or sunflower oil. Meal planning  Eat a balanced diet that includes: ? 5 or more servings of fruits and vegetables each day. At each meal, try to fill half of your plate with fruits and vegetables. ? Up to 6-8 servings of whole grains each day. ? Less than 6 oz of lean meat, poultry, or fish each day. A 3-oz serving of meat is about the same size as a deck of cards. One egg equals 1 oz. ? 2 servings of low-fat dairy each day. ? A serving of nuts, seeds, or beans 5 times each week. ? Heart-healthy fats. Healthy fats called Omega-3 fatty acids are found in foods such as flaxseeds and coldwater fish, like sardines, salmon, and mackerel.  Limit how much you eat of the following: ? Canned or prepackaged foods. ? Food that is high in trans fat, such as fried foods. ? Food that is high in saturated fat, such as fatty meat. ? Sweets, desserts, sugary drinks, and other foods with added sugar. ? Full-fat dairy products.  Do not salt foods before eating.  Try to eat at least 2 vegetarian meals each week.  Eat more home-cooked food and less restaurant, buffet, and fast food.  When eating at a restaurant, ask that your food be prepared with less salt or no salt, if possible. What foods are recommended? The items listed may not be a complete list. Talk with your dietitian about   what dietary choices are best for you. Grains Whole-grain or whole-wheat bread. Whole-grain or whole-wheat pasta. Brown rice. Oatmeal. Quinoa. Bulgur. Whole-grain and low-sodium cereals. Pita bread. Low-fat, low-sodium crackers. Whole-wheat flour tortillas. Vegetables Fresh or frozen vegetables (raw, steamed, roasted, or grilled). Low-sodium or reduced-sodium tomato and vegetable juice. Low-sodium or reduced-sodium tomato sauce and tomato paste. Low-sodium or reduced-sodium canned vegetables. Fruits All fresh, dried, or frozen fruit. Canned fruit in natural juice (without  added sugar). Meat and other protein foods Skinless chicken or turkey. Ground chicken or turkey. Pork with fat trimmed off. Fish and seafood. Egg whites. Dried beans, peas, or lentils. Unsalted nuts, nut butters, and seeds. Unsalted canned beans. Lean cuts of beef with fat trimmed off. Low-sodium, lean deli meat. Dairy Low-fat (1%) or fat-free (skim) milk. Fat-free, low-fat, or reduced-fat cheeses. Nonfat, low-sodium ricotta or cottage cheese. Low-fat or nonfat yogurt. Low-fat, low-sodium cheese. Fats and oils Soft margarine without trans fats. Vegetable oil. Low-fat, reduced-fat, or light mayonnaise and salad dressings (reduced-sodium). Canola, safflower, olive, soybean, and sunflower oils. Avocado. Seasoning and other foods Herbs. Spices. Seasoning mixes without salt. Unsalted popcorn and pretzels. Fat-free sweets. What foods are not recommended? The items listed may not be a complete list. Talk with your dietitian about what dietary choices are best for you. Grains Baked goods made with fat, such as croissants, muffins, or some breads. Dry pasta or rice meal packs. Vegetables Creamed or fried vegetables. Vegetables in a cheese sauce. Regular canned vegetables (not low-sodium or reduced-sodium). Regular canned tomato sauce and paste (not low-sodium or reduced-sodium). Regular tomato and vegetable juice (not low-sodium or reduced-sodium). Pickles. Olives. Fruits Canned fruit in a light or heavy syrup. Fried fruit. Fruit in cream or butter sauce. Meat and other protein foods Fatty cuts of meat. Ribs. Fried meat. Bacon. Sausage. Bologna and other processed lunch meats. Salami. Fatback. Hotdogs. Bratwurst. Salted nuts and seeds. Canned beans with added salt. Canned or smoked fish. Whole eggs or egg yolks. Chicken or turkey with skin. Dairy Whole or 2% milk, cream, and half-and-half. Whole or full-fat cream cheese. Whole-fat or sweetened yogurt. Full-fat cheese. Nondairy creamers. Whipped toppings.  Processed cheese and cheese spreads. Fats and oils Butter. Stick margarine. Lard. Shortening. Ghee. Bacon fat. Tropical oils, such as coconut, palm kernel, or palm oil. Seasoning and other foods Salted popcorn and pretzels. Onion salt, garlic salt, seasoned salt, table salt, and sea salt. Worcestershire sauce. Tartar sauce. Barbecue sauce. Teriyaki sauce. Soy sauce, including reduced-sodium. Steak sauce. Canned and packaged gravies. Fish sauce. Oyster sauce. Cocktail sauce. Horseradish that you find on the shelf. Ketchup. Mustard. Meat flavorings and tenderizers. Bouillon cubes. Hot sauce and Tabasco sauce. Premade or packaged marinades. Premade or packaged taco seasonings. Relishes. Regular salad dressings. Where to find more information:  National Heart, Lung, and Blood Institute: www.nhlbi.nih.gov  American Heart Association: www.heart.org Summary  The DASH eating plan is a healthy eating plan that has been shown to reduce high blood pressure (hypertension). It may also reduce your risk for type 2 diabetes, heart disease, and stroke.  With the DASH eating plan, you should limit salt (sodium) intake to 2,300 mg a day. If you have hypertension, you may need to reduce your sodium intake to 1,500 mg a day.  When on the DASH eating plan, aim to eat more fresh fruits and vegetables, whole grains, lean proteins, low-fat dairy, and heart-healthy fats.  Work with your health care provider or diet and nutrition specialist (dietitian) to adjust your eating plan to your   individual calorie needs. This information is not intended to replace advice given to you by your health care provider. Make sure you discuss any questions you have with your health care provider. Document Revised: 07/26/2017 Document Reviewed: 08/06/2016 Elsevier Patient Education  2020 Elsevier Inc.  

## 2020-04-04 NOTE — Assessment & Plan Note (Signed)
History of, check CBC today.

## 2020-04-04 NOTE — Progress Notes (Signed)
BP 118/78   Pulse 71   Temp 98.8 F (37.1 C) (Oral)   Ht 5\' 9"  (1.753 m)   Wt 191 lb (86.6 kg)   SpO2 96%   BMI 28.21 kg/m    Subjective:    Patient ID: Mark Quinn, male    DOB: [birth year], 74 y.o.   MRN: [id number]  HPI: Mark Quinn is a 74 y.o. male presenting on 04/04/2020 for comprehensive medical examination. Current medical complaints include:none  He currently lives with: wife Interim Problems from his last visit: no   HYPERTENSION / HYPERLIPIDEMIA Continues on Plavix, Metoprolol, Lisinopril, and Vytorin. Satisfied with current treatment? yes Duration of hypertension: chronic BP monitoring frequency: weekly BP range: 120-130/70's range at work BP medication side effects: no Duration of hyperlipidemia: chronic Cholesterol medication side effects: no Cholesterol supplements: none Medication compliance: good compliance Aspirin: no Recent stressors: no Recurrent headaches: no Visual changes: no Palpitations: no Dyspnea: no Chest pain: no Lower extremity edema: no Dizzy/lightheaded: no   GOUT Continues on Allopurinol.  Has not had a gout flare in several years.   Duration:chronic Right 1st metatarsophalangeal pain: no Left 1st metatarsophalangeal pain: no Right knee pain: no Left knee pain: no Swelling: no Redness: no Trauma: no Recent dietary change or indiscretion: no Fevers: no Nausea/vomiting: no Aggravating factors: alcohol and certain foods Alleviating factors: medication Status:  stable  BPH Continues on Finasteride. BPH status: stable Satisfied with current treatment?: yes Medication side effects: no Medication compliance: good compliance Duration: chronic Nocturia: no Urinary frequency:no Incomplete voiding: no Urgency: no Weak urinary stream: no Straining to start stream: no Dysuria: no Treatments attempted: Finasteride  Functional Status Survey: Is the patient deaf or have difficulty hearing?: Yes Does the patient have  difficulty seeing, even when wearing glasses/contacts?: No Does the patient have difficulty concentrating, remembering, or making decisions?: No Does the patient have difficulty walking or climbing stairs?: No Does the patient have difficulty dressing or bathing?: No Does the patient have difficulty doing errands alone such as visiting a doctor's office or shopping?: No  FALL RISK: Fall Risk  03/31/2019 05/29/2018 11/21/2017 05/20/2017 11/12/2016  Falls in the past year? 0 No No No No  Number falls in past yr: 0 - - - -  Injury with Fall? 0 - - - -  Follow up Falls evaluation completed - - - -    Depression Screen Depression screen [facility] 2/9 04/04/2020 03/31/2019 05/29/2018 05/20/2017 11/12/2016  Decreased Interest 0 0 0 0 0  Down, Depressed, Hopeless 0 0 0 0 0  PHQ - 2 Score 0 0 0 0 0  Altered sleeping 0 0 1 - -  Tired, decreased energy 1 0 1 - -  Change in appetite 1 0 0 - -  Feeling bad or failure about yourself  1 0 0 - -  Trouble concentrating 0 0 1 - -  Moving slowly or fidgety/restless 0 0 0 - -  Suicidal thoughts 0 0 0 - -  PHQ-9 Score 3 0 3 - -  Difficult doing work/chores Not difficult at all Not difficult at all Not difficult at all - -    Advanced Directives <no information>  Past Medical History:  Past Medical History:  Diagnosis Date  . CAD (coronary artery disease)   . Cellulitis    of back  . Gout   . Melanoma ([place]) 06/2013   Stage 1 A Excision  . Obesity     Surgical History:  Past Surgical History:  Procedure Laterality Date  . CARDIAC CATHETERIZATION  2006   [facility]  . CORONARY STENT PLACEMENT  2006    CYPHER drug eluting Stent RX 3.50 X 45mm/CYPHER drug eluting Stent RX 3.50 X 33mm    Medications:  Current Outpatient Medications on File Prior to Visit  Medication Sig  . diphenhydramine-acetaminophen (TYLENOL PM) 25-500 MG TABS tablet Take 1 tablet by mouth at bedtime as needed.  . methocarbamol (ROBAXIN) 500 MG tablet Take 500 mg by mouth 4 (four) times  daily as needed.  . Multiple Vitamin (MULTIVITAMIN) tablet Take 1 tablet by mouth daily.   No current facility-administered medications on file prior to visit.    Allergies:  Allergies  Allergen Reactions  . Other     Seasonal Allergies    Social History:  Social History   Socioeconomic History  . Marital status: Married    Spouse name: Not on file  . Number of children: Not on file  . Years of education: Not on file  . Highest education level: Not on file  Occupational History  . Not on file  Tobacco Use  . Smoking status: Former Smoker    Packs/day: 1.00    Years: 8.00    Pack years: 8.00    Types: Cigarettes    Quit date: 04/13/1975    Years since quitting: 45.0  . Smokeless tobacco: Never Used  Vaping Use  . Vaping Use: Never used  Substance and Sexual Activity  . Alcohol use: Yes    Alcohol/week: 0.0 standard drinks    Comment: on occasion  . Drug use: No  . Sexual activity: Not on file  Other Topics Concern  . Not on file  Social History Narrative  . Not on file   Social Determinants of Health   Financial Resource Strain:   . Difficulty of Paying Living Expenses:   Food Insecurity:   . Worried About Charity fundraiser in the Last Year:   . Arboriculturist in the Last Year:   Transportation Needs:   . Film/video editor (Medical):   Marland Kitchen Lack of Transportation (Non-Medical):   Physical Activity:   . Days of Exercise per Week:   . Minutes of Exercise per Session:   Stress:   . Feeling of Stress :   Social Connections:   . Frequency of Communication with Friends and Family:   . Frequency of Social Gatherings with Friends and Family:   . Attends Religious Services:   . Active Member of Clubs or Organizations:   . Attends Archivist Meetings:   Marland Kitchen Marital Status:   Intimate Partner Violence:   . Fear of Current or Ex-Partner:   . Emotionally Abused:   Marland Kitchen Physically Abused:   . Sexually Abused:    Social History   Tobacco Use    Smoking Status Former Smoker  . Packs/day: 1.00  . Years: 8.00  . Pack years: 8.00  . Types: Cigarettes  . Quit date: 04/13/1975  . Years since quitting: 45.0  Smokeless Tobacco Never Used   Social History   Substance and Sexual Activity  Alcohol Use Yes  . Alcohol/week: 0.0 standard drinks   Comment: on occasion    Family History:  Family History  Problem Relation Age of Onset  . Heart disease Mother   . Hyperlipidemia Mother   . Diabetes Son     Past medical history, surgical history, medications, allergies, family history and social history reviewed with patient today and changes  made to appropriate areas of the chart.   Review of Systems - negative All other ROS negative except what is listed above and in the HPI.      Objective:    BP 118/78   Pulse 71   Temp 98.8 F (37.1 C) (Oral)   Ht 5\' 9"  (1.753 m)   Wt 191 lb (86.6 kg)   SpO2 96%   BMI 28.21 kg/m   Wt Readings from Last 3 Encounters:  04/04/20 191 lb (86.6 kg)  11/18/19 195 lb 2 oz (88.5 kg)  03/31/19 190 lb (86.2 kg)    Physical Exam Vitals and nursing note reviewed.  Constitutional:      General: He is awake. He is not in acute distress.    Appearance: He is well-developed, well-groomed and overweight. He is not ill-appearing.  HENT:     Head: Normocephalic and atraumatic.     Right Ear: Hearing, tympanic membrane, ear canal and external ear normal. No drainage.     Left Ear: Hearing, tympanic membrane, ear canal and external ear normal. No drainage.     Nose: Nose normal.     Mouth/Throat:     Pharynx: Uvula midline.  Eyes:     General: Lids are normal.        Right eye: No discharge.        Left eye: No discharge.     Extraocular Movements: Extraocular movements intact.     Conjunctiva/sclera: Conjunctivae normal.     Pupils: Pupils are equal, round, and reactive to light.     Visual Fields: Right eye visual fields normal and left eye visual fields normal.  Neck:     Thyroid: No  thyromegaly.     Vascular: No carotid bruit or JVD.     Trachea: Trachea normal.  Cardiovascular:     Rate and Rhythm: Normal rate and regular rhythm.     Heart sounds: Normal heart sounds, S1 normal and S2 normal. No murmur heard.  No gallop.   Pulmonary:     Effort: Pulmonary effort is normal. No accessory muscle usage or respiratory distress.     Breath sounds: Normal breath sounds.  Abdominal:     General: Bowel sounds are normal.     Palpations: Abdomen is soft. There is no hepatomegaly or splenomegaly.     Tenderness: There is no abdominal tenderness.  Musculoskeletal:        General: Normal range of motion.     Cervical back: Normal range of motion and neck supple.     Right lower leg: No edema.     Left lower leg: No edema.  Lymphadenopathy:     Head:     Right side of head: No submental, submandibular, tonsillar, preauricular or posterior auricular adenopathy.     Left side of head: No submental, submandibular, tonsillar, preauricular or posterior auricular adenopathy.     Cervical: No cervical adenopathy.  Skin:    General: Skin is warm and dry.     Capillary Refill: Capillary refill takes less than 2 seconds.     Findings: No rash.  Neurological:     Mental Status: He is alert and oriented to person, place, and time.     Cranial Nerves: Cranial nerves are intact.     Gait: Gait is intact.     Deep Tendon Reflexes: Reflexes are normal and symmetric.     Reflex Scores:      Brachioradialis reflexes are 2+ on the right side and  2+ on the left side.      Patellar reflexes are 2+ on the right side and 2+ on the left side. Psychiatric:        Attention and Perception: Attention normal.        Mood and Affect: Mood normal.        Speech: Speech normal.        Behavior: Behavior normal. Behavior is cooperative.        Thought Content: Thought content normal.        Cognition and Memory: Cognition normal.        Judgment: Judgment normal.     Results for orders placed  or performed in visit on 10/06/19  Lipid Panel w/o Chol/HDL Ratio  Result Value Ref Range   Cholesterol, Total 162 100 - 199 mg/dL   Triglycerides 203 (H) 0 - 149 mg/dL   HDL 44 >39 mg/dL   VLDL Cholesterol Cal 34 5 - 40 mg/dL   LDL Chol Calc (NIH) 84 0 - 99 mg/dL  Basic Metabolic Panel (BMET)  Result Value Ref Range   Glucose 90 65 - 99 mg/dL   BUN 16 8 - 27 mg/dL   Creatinine, Ser 1.27 0.76 - 1.27 mg/dL   GFR calc non Af Amer 56 (L) >59 mL/min/1.73   GFR calc Af Amer 65 >59 mL/min/1.73   BUN/Creatinine Ratio 13 10 - 24   Sodium 141 134 - 144 mmol/L   Potassium 4.4 3.5 - 5.2 mmol/L   Chloride 104 96 - 106 mmol/L   CO2 22 20 - 29 mmol/L   Calcium 9.5 8.6 - 10.2 mg/dL      Assessment & Plan:   Problem List Items Addressed This Visit      Cardiovascular and Mediastinum   Essential hypertension, benign    Chronic, stable with BP at goal in office and home. Continue current medication regimen and adjust as needed.  Recommend continue to check his BP at home a few days a week and document.  DASH diet focus at home.  Labs today.  Return in 6 months for follow-up.  Refills sent.      Relevant Medications   ezetimibe-simvastatin (VYTORIN) 10-40 MG tablet   lisinopril (ZESTRIL) 20 MG tablet   metoprolol succinate (TOPROL-XL) 50 MG 24 hr tablet   Other Relevant Orders   CBC with Differential/Platelet   Comprehensive metabolic panel   TSH   CAD (coronary artery disease)    Chronic, ongoing.  Continue Plavix and Vytorin for prevention.      Relevant Medications   ezetimibe-simvastatin (VYTORIN) 10-40 MG tablet   lisinopril (ZESTRIL) 20 MG tablet   metoprolol succinate (TOPROL-XL) 50 MG 24 hr tablet     Digestive   Gastroesophageal reflux disease without esophagitis    Chronic, stable.  Continue current medication regimen and adjust as needed.  Mag level today.      Relevant Medications   RABEprazole (ACIPHEX) 20 MG tablet   Other Relevant Orders   Magnesium      Genitourinary   BPH (benign prostatic hyperplasia)    Chronic, stable.  Continue current medication regimen as is effective and no symptoms.  PSA today per patient request.      Relevant Medications   finasteride (PROSCAR) 5 MG tablet   Other Relevant Orders   PSA     Other   Hyperlipemia    Chronic, ongoing.  Continue current medication regimen and adjust as needed.  Labs today.  Refills sent.  Relevant Medications   ezetimibe-simvastatin (VYTORIN) 10-40 MG tablet   lisinopril (ZESTRIL) 20 MG tablet   metoprolol succinate (TOPROL-XL) 50 MG 24 hr tablet   Other Relevant Orders   Lipid Panel w/o Chol/HDL Ratio   Gout    Chronic, stable.  Continue current medication regimen and adjust as needed.  Labs today. Refills sent.      Relevant Medications   allopurinol (ZYLOPRIM) 100 MG tablet   Other Relevant Orders   Uric acid   Anemia, unspecified    History of, check CBC today.      BMI 28.0-28.9,adult    Recommended eating smaller high protein, low fat meals more frequently and exercising 30 mins a day 5 times a week with a goal of 10-15lb weight loss in the next 3 months. Patient voiced their understanding and motivation to adhere to these recommendations.        Other Visit Diagnoses    Routine general medical examination at a health care facility    -  Primary   Annual labs today to include CBC, CMP, TSH, PSA, lipid   Relevant Orders   CBC with Differential/Platelet   Comprehensive metabolic panel   TSH   Need for Td vaccine       Relevant Orders   Td vaccine greater than or equal to 7yo preservative free IM (Completed)      Discussed aspirin prophylaxis for myocardial infarction prevention and decision was made to continue Plavix.  LABORATORY TESTING:  Health maintenance labs ordered today as discussed above.   The natural history of prostate cancer and ongoing controversy regarding screening and potential treatment outcomes of prostate cancer has been  discussed with the patient. The meaning of a false positive PSA and a false negative PSA has been discussed. He indicates understanding of the limitations of this screening test and wishes to proceed with screening PSA testing.   IMMUNIZATIONS:   - Tdap: Tetanus vaccination status reviewed: Td vaccination indicated and given today. - Influenza: Up to date - Pneumovax: Up to date - Prevnar: Up to date - Zostavax vaccine: Up to date  SCREENING: - Colonoscopy: Up to date  Discussed with patient purpose of the colonoscopy is to detect colon cancer at curable precancerous or early stages   - AAA Screening: Not applicable  -Hearing Test: Not applicable  -Spirometry: Not applicable   PATIENT COUNSELING:    Sexuality: Discussed sexually transmitted diseases, partner selection, use of condoms, avoidance of unintended pregnancy  and contraceptive alternatives.   Advised to avoid cigarette smoking.  I discussed with the patient that most people either abstain from alcohol or drink within safe limits (<=14/week and <=4 drinks/occasion for males, <=7/weeks and <= 3 drinks/occasion for females) and that the risk for alcohol disorders and other health effects rises proportionally with the number of drinks per week and how often a drinker exceeds daily limits.  Discussed cessation/primary prevention of drug use and availability of treatment for abuse.   Diet: Encouraged to adjust caloric intake to maintain  or achieve ideal body weight, to reduce intake of dietary saturated fat and total fat, to limit sodium intake by avoiding high sodium foods and not adding table salt, and to maintain adequate dietary potassium and calcium preferably from fresh fruits, vegetables, and low-fat dairy products.    stressed the importance of regular exercise  Injury prevention: Discussed safety belts, safety helmets, smoke detector, smoking near bedding or upholstery.   Dental health: Discussed importance of regular  tooth brushing,  flossing, and dental visits.   Follow up plan: NEXT PREVENTATIVE PHYSICAL DUE IN 1 YEAR. Return in about 6 months (around 10/05/2020) for HTN/HLD.

## 2020-04-04 NOTE — Assessment & Plan Note (Signed)
Recommended eating smaller high protein, low fat meals more frequently and exercising 30 mins a day 5 times a week with a goal of 10-15lb weight loss in the next 3 months. Patient voiced their understanding and motivation to adhere to these recommendations.  

## 2020-04-04 NOTE — Assessment & Plan Note (Signed)
Chronic, ongoing.  Continue current medication regimen and adjust as needed.  Labs today.  Refills sent.

## 2020-04-04 NOTE — Assessment & Plan Note (Signed)
Chronic, ongoing.  Continue Plavix and Vytorin for prevention.

## 2020-04-04 NOTE — Assessment & Plan Note (Signed)
Chronic, stable.  Continue current medication regimen and adjust as needed.  Labs today. Refills sent.

## 2020-04-04 NOTE — Assessment & Plan Note (Signed)
Chronic, stable with BP at goal in office and home. Continue current medication regimen and adjust as needed.  Recommend continue to check his BP at home a few days a week and document.  DASH diet focus at home.  Labs today.  Return in 6 months for follow-up.  Refills sent.

## 2020-04-04 NOTE — Assessment & Plan Note (Signed)
Chronic, stable. Continue current medication regimen and adjust as needed.  Mag level today. 

## 2020-04-04 NOTE — Assessment & Plan Note (Signed)
Chronic, stable.  Continue current medication regimen as is effective and no symptoms.  PSA today per patient request.

## 2020-04-05 ENCOUNTER — Other Ambulatory Visit: Payer: Self-pay | Admitting: Nurse Practitioner

## 2020-04-05 DIAGNOSIS — D649 Anemia, unspecified: Secondary | ICD-10-CM

## 2020-04-05 LAB — CBC WITH DIFFERENTIAL/PLATELET
Basophils Absolute: 0 10*3/uL (ref 0.0–0.2)
Basos: 0 %
EOS (ABSOLUTE): 0.2 10*3/uL (ref 0.0–0.4)
Eos: 2 %
Hematocrit: 36.1 % — ABNORMAL LOW (ref 37.5–51.0)
Hemoglobin: 12.6 g/dL — ABNORMAL LOW (ref 13.0–17.7)
Immature Grans (Abs): 0 10*3/uL (ref 0.0–0.1)
Immature Granulocytes: 0 %
Lymphocytes Absolute: 2.1 10*3/uL (ref 0.7–3.1)
Lymphs: 19 %
MCH: 32.6 pg (ref 26.6–33.0)
MCHC: 34.9 g/dL (ref 31.5–35.7)
MCV: 93 fL (ref 79–97)
Monocytes Absolute: 0.8 10*3/uL (ref 0.1–0.9)
Monocytes: 7 %
Neutrophils Absolute: 7.9 10*3/uL — ABNORMAL HIGH (ref 1.4–7.0)
Neutrophils: 72 %
Platelets: 246 10*3/uL (ref 150–450)
RBC: 3.87 x10E6/uL — ABNORMAL LOW (ref 4.14–5.80)
RDW: 13 % (ref 11.6–15.4)
WBC: 11 10*3/uL — ABNORMAL HIGH (ref 3.4–10.8)

## 2020-04-05 LAB — COMPREHENSIVE METABOLIC PANEL
ALT: 25 IU/L (ref 0–44)
AST: 22 IU/L (ref 0–40)
Albumin/Globulin Ratio: 1.8 (ref 1.2–2.2)
Albumin: 4.6 g/dL (ref 3.7–4.7)
Alkaline Phosphatase: 63 IU/L (ref 48–121)
BUN/Creatinine Ratio: 18 (ref 10–24)
BUN: 18 mg/dL (ref 8–27)
Bilirubin Total: 0.4 mg/dL (ref 0.0–1.2)
CO2: 24 mmol/L (ref 20–29)
Calcium: 9.8 mg/dL (ref 8.6–10.2)
Chloride: 100 mmol/L (ref 96–106)
Creatinine, Ser: 1.01 mg/dL (ref 0.76–1.27)
GFR calc Af Amer: 86 mL/min/{1.73_m2} (ref >59)
GFR calc non Af Amer: 74 mL/min/{1.73_m2} (ref >59)
Globulin, Total: 2.6 g/dL (ref 1.5–4.5)
Glucose: 91 mg/dL (ref 65–99)
Potassium: 4.8 mmol/L (ref 3.5–5.2)
Sodium: 137 mmol/L (ref 134–144)
Total Protein: 7.2 g/dL (ref 6.0–8.5)

## 2020-04-05 LAB — LIPID PANEL W/O CHOL/HDL RATIO
Cholesterol, Total: 167 mg/dL (ref 100–199)
HDL: 43 mg/dL (ref >39)
LDL Chol Calc (NIH): 88 mg/dL (ref 0–99)
Triglycerides: 213 mg/dL — ABNORMAL HIGH (ref 0–149)
VLDL Cholesterol Cal: 36 mg/dL (ref 5–40)

## 2020-04-05 LAB — MAGNESIUM: Magnesium: 2 mg/dL (ref 1.6–2.3)

## 2020-04-05 LAB — PSA: Prostate Specific Ag, Serum: 2.4 ng/mL (ref 0.0–4.0)

## 2020-04-05 LAB — TSH: TSH: 1.98 u[IU]/mL (ref 0.450–4.500)

## 2020-04-05 LAB — URIC ACID: Uric Acid: 5.5 mg/dL (ref 3.8–8.4)

## 2020-04-05 NOTE — Progress Notes (Signed)
Contacted via MyChart  Good afternoon Mark Quinn.  Your labs have returned.  Overall they are stable.  You continue to show some mild anemia, however your white blood cell count and neutrophils are also slightly elevated this check.  Have you been sick recently?  I would like to recheck this via an outpatient lab visit only in 6 weeks, can you schedule this with office please.  Your triglycerides remain slightly elevated and we will continue to monitor.  Any questions? Keep being awesome!! Kindest regards, Rhina Kramme

## 2020-06-13 ENCOUNTER — Telehealth: Payer: Self-pay | Admitting: Nurse Practitioner

## 2020-06-13 NOTE — Telephone Encounter (Signed)
Pt's spouse called in for assistance. She has spoken with billing and was advised to contact office. Spouse says that pt was billed a 35.00 change on DOS 04/04/20 with his CPE. Pt and spouse would like further assistance with this concern to know what was the additional charge for?    Pt/spouse says that billing will also be assisting   Please assist.   CB: [phone]

## 2020-06-20 NOTE — Telephone Encounter (Signed)
LMOM advising pt's spouse that the copay was due to additional medical problems discussed outside of what is considered wellness.

## 2020-07-18 ENCOUNTER — Ambulatory Visit: Payer: Self-pay | Admitting: *Deleted

## 2020-07-18 NOTE — Telephone Encounter (Signed)
Patient's wife is calling to report patient has puncture wound from wire cage and looks like it is getting infected. Swelling and red- appointment scheduled.  Reason for Disposition  Large swelling or bruise (> 2 inches or 5 cm)  [1] Red area or streak AND [2] no fever  Answer Assessment - Initial Assessment Questions 1. MECHANISM: "How did the injury happen?"     Moving dog crate- puncture wound-R hand 2. ONSET: "When did the injury happen?" (Minutes or hours ago)      Saturday afternoon 3. APPEARANCE of INJURY: "What does the injury look like?"      Red and swollen 4. SEVERITY: "Can you use the hand normally?" "Can you bend your fingers into a ball and then fully open them?"     yes 5. SIZE: For cuts, bruises, or swelling, ask: "How large is it?" (e.g., inches or centimeters;  entire hand or wrist)      puncture 6. PAIN: "Is there pain?" If Yes, ask: "How bad is the pain?"  (Scale 1-10; or mild, moderate, severe)     Yes- with movement 7. TETANUS: For any breaks in the skin, ask: "When was the last tetanus booster?"    yes 8. OTHER SYMPTOMS: "Do you have any other symptoms?"      no 9. PREGNANCY: "Is there any chance you are pregnant?" "When was your last menstrual period?"     n/a  Protocols used: HAND AND WRIST INJURY-A-AH, WOUND INFECTION-A-AH

## 2020-07-19 ENCOUNTER — Ambulatory Visit (INDEPENDENT_AMBULATORY_CARE_PROVIDER_SITE_OTHER): Payer: Medicare Other | Admitting: Nurse Practitioner

## 2020-07-19 ENCOUNTER — Ambulatory Visit
Admission: RE | Admit: 2020-07-19 | Discharge: 2020-07-19 | Disposition: A | Discharge status: Home / Self Care | Payer: Medicare Other | Attending: Nurse Practitioner | Admitting: Nurse Practitioner

## 2020-07-19 ENCOUNTER — Encounter: Payer: Self-pay | Admitting: Nurse Practitioner

## 2020-07-19 ENCOUNTER — Other Ambulatory Visit: Payer: Self-pay

## 2020-07-19 ENCOUNTER — Ambulatory Visit
Admission: RE | Admit: 2020-07-19 | Discharge: 2020-07-19 | Disposition: A | Discharge status: Home / Self Care | Payer: Medicare Other | Source: Ambulatory Visit | Attending: Nurse Practitioner | Admitting: Nurse Practitioner

## 2020-07-19 VITALS — BP 122/73 | HR 88 | Temp 98.6°F | Ht 69.84 in | Wt 191.4 lb

## 2020-07-19 DIAGNOSIS — S6991XA Unspecified injury of right wrist, hand and finger(s), initial encounter: Secondary | ICD-10-CM | POA: Diagnosis not present

## 2020-07-19 DIAGNOSIS — M79641 Pain in right hand: Secondary | ICD-10-CM

## 2020-07-19 DIAGNOSIS — L03113 Cellulitis of right upper limb: Secondary | ICD-10-CM | POA: Diagnosis not present

## 2020-07-19 DIAGNOSIS — M19041 Primary osteoarthritis, right hand: Secondary | ICD-10-CM | POA: Diagnosis not present

## 2020-07-19 MED ORDER — AMOXICILLIN-POT CLAVULANATE 875-125 MG PO TABS
1.0000 | ORAL_TABLET | Freq: Two times a day (BID) | ORAL | 0 refills | Status: AC
Start: 2020-07-19 — End: 2020-07-29

## 2020-07-19 NOTE — Progress Notes (Signed)
BP 122/73   Pulse 88   Temp 98.6 F (37 C) (Oral)   Ht 5' 9.84" (1.774 m)   Wt 191 lb 6.4 oz (86.8 kg)   SpO2 98%   BMI 27.59 kg/m    Subjective:    Patient ID: Mark Quinn, male    DOB: June 02, 1946, 74 y.o.   MRN: [id number]  HPI: Mark Quinn is a 74 y.o. male presenting with hand pain.  Chief Complaint  Patient presents with  . Hand Injury    Hurt his right hand on Saturday, is red, swollen and painful. Patient states that he does not have any strength in his hand and cant twist wrist.   HAND PAIN Saturday afternoon, was moving a dog cage and was replacing the wire and the wire caught in his hand.  He felt a sharp pain.  Sunday, it started hurting, swelling and yesterday started turning red. Does take Plavix.  Duration: days Involved hand: right Mechanism of injury: trauma Location: medial Onset: sudden Severity: mild to severe Quality: throbbing to sharp pain Frequency: intermittent Radiation: no Aggravating factors: movement Alleviating factors: Tylenol Treatments attempted: Tylenol, hydrogen peroxide, Neosporin Relief with NSAIDs?: mild Weakness: yes Numbness: no Redness: yes Swelling:yes Bruising: no Fevers: no   SKIN INFECTION Duration: days Location: right hand History of trauma in area: yes Pain: yes Quality: sharp, throbbing Severity: ranges from mild to severe Redness: yes Swelling: yes Oozing: yes Pus: no Fevers: no Nausea/vomiting: no Status: worse Treatments attempted:hydrogen peroxide, neosporin  Tetanus: UTD  Allergies  Allergen Reactions  . Other     Seasonal Allergies   Outpatient Encounter Medications as of 07/19/2020  Medication Sig  . allopurinol (ZYLOPRIM) 100 MG tablet Take 1 tablet (100 mg total) by mouth daily.  . clopidogrel (PLAVIX) 75 MG tablet Take 1 tablet (75 mg total) by mouth daily.  . diphenhydramine-acetaminophen (TYLENOL PM) 25-500 MG TABS tablet Take 1 tablet by mouth at bedtime as needed.  .  ezetimibe-simvastatin (VYTORIN) 10-40 MG tablet Take 1 tablet by mouth daily.  . finasteride (PROSCAR) 5 MG tablet Take 1 tablet (5 mg total) by mouth daily.  Marland Kitchen lisinopril (ZESTRIL) 20 MG tablet Take 1 tablet (20 mg total) by mouth daily.  . meloxicam (MOBIC) 15 MG tablet Take 1 tablet (15 mg total) by mouth daily.  . methocarbamol (ROBAXIN) 500 MG tablet Take 500 mg by mouth 4 (four) times daily as needed.  . metoprolol succinate (TOPROL-XL) 50 MG 24 hr tablet Take 1 tablet (50 mg total) by mouth daily. Take with or immediately following a meal.  . Multiple Vitamin (MULTIVITAMIN) tablet Take 1 tablet by mouth daily.  . RABEprazole (ACIPHEX) 20 MG tablet Take 1 tablet (20 mg total) by mouth daily.  Marland Kitchen amoxicillin-clavulanate (AUGMENTIN) 875-125 MG tablet Take 1 tablet by mouth 2 (two) times daily for 10 days.   No facility-administered encounter medications on file as of 07/19/2020.   Patient Active Problem List   Diagnosis Date Noted  . Cellulitis of right hand 07/19/2020  . BMI 28.0-28.9,adult 04/04/2020  . Gastroesophageal reflux disease without esophagitis 04/04/2020  . Arthritis 12/03/2018  . Aortic valve sclerosis 11/21/2017  . Anemia, unspecified 05/20/2017  . BPH (benign prostatic hyperplasia) 10/28/2015  . Essential hypertension, benign 04/13/2015  . Hyperlipemia 04/13/2015  . Gout 04/13/2015  . CAD (coronary artery disease) 04/13/2015   Past Medical History:  Diagnosis Date  . CAD (coronary artery disease)   . Cellulitis    of back  .  Gout   . Melanoma ([place]) 06/2013   Stage 1 A Excision  . Obesity    Relevant past medical, surgical, family and social history reviewed and updated as indicated. Interim medical history since our last visit reviewed.  Review of Systems  Constitutional: Negative.  Negative for fatigue and fever.  Gastrointestinal: Negative.  Negative for nausea and vomiting.  Skin: Positive for wound. Negative for color change, pallor and rash.    Neurological: Negative.   Psychiatric/Behavioral: Negative.     Per HPI unless specifically indicated above     Objective:    BP 122/73   Pulse 88   Temp 98.6 F (37 C) (Oral)   Ht 5' 9.84" (1.774 m)   Wt 191 lb 6.4 oz (86.8 kg)   SpO2 98%   BMI 27.59 kg/m   Wt Readings from Last 3 Encounters:  07/19/20 191 lb 6.4 oz (86.8 kg)  04/04/20 191 lb (86.6 kg)  11/18/19 195 lb 2 oz (88.5 kg)    Physical Exam Vitals and nursing note reviewed.  Constitutional:      General: He is not in acute distress.    Appearance: Normal appearance. He is not toxic-appearing.  Eyes:     General: No scleral icterus.    Extraocular Movements: Extraocular movements intact.  Cardiovascular:     Rate and Rhythm: Normal rate.  Musculoskeletal:        General: Swelling (medial aspect of right hand) present.     Right hand: Swelling present. No tenderness or bony tenderness. Decreased range of motion. Normal strength. Normal sensation. Normal capillary refill. Normal pulse.  Skin:    General: Skin is warm and dry.     Capillary Refill: Capillary refill takes less than 2 seconds.     Coloration: Skin is not jaundiced or pale.     Findings: Erythema (medial side of right hand) present.     Comments: 0.2 cm abrasion noted to medial side of right hand  Neurological:     General: No focal deficit present.     Mental Status: He is alert and oriented to person, place, and time.     Motor: No weakness.     Gait: Gait normal.  Psychiatric:        Mood and Affect: Mood normal.        Behavior: Behavior normal.        Thought Content: Thought content normal.        Judgment: Judgment normal.       Assessment & Plan:   Problem List Items Addressed This Visit      Other   Cellulitis of right hand - Primary    Acute, ongoing.  Given injury occurred with wire dog fence, will treat as dog bite with Augmentin every 12 hours for 10 days.  If not better by next week, return to clinic.       Other  Visit Diagnoses    Right hand pain       Relevant Orders   DG Hand Complete Right       Follow up plan: Return if symptoms worsen or fail to improve.

## 2020-07-19 NOTE — Patient Instructions (Signed)

## 2020-07-19 NOTE — Assessment & Plan Note (Signed)
Acute, ongoing.  Given injury occurred with wire dog fence, will treat as dog bite with Augmentin every 12 hours for 10 days.  If not better by next week, return to clinic.

## 2020-10-05 ENCOUNTER — Ambulatory Visit: Payer: 59 | Admitting: Nurse Practitioner

## 2020-10-05 ENCOUNTER — Ambulatory Visit: Payer: 59 | Admitting: Family Medicine

## 2020-10-17 ENCOUNTER — Encounter: Payer: Self-pay | Admitting: Family Medicine

## 2020-10-17 ENCOUNTER — Ambulatory Visit (INDEPENDENT_AMBULATORY_CARE_PROVIDER_SITE_OTHER): Payer: Medicare Other | Admitting: Family Medicine

## 2020-10-17 ENCOUNTER — Other Ambulatory Visit: Payer: Self-pay

## 2020-10-17 VITALS — BP 116/63 | HR 60 | Temp 98.0°F | Wt 190.8 lb

## 2020-10-17 DIAGNOSIS — I10 Essential (primary) hypertension: Secondary | ICD-10-CM

## 2020-10-17 DIAGNOSIS — I2510 Atherosclerotic heart disease of native coronary artery without angina pectoris: Secondary | ICD-10-CM

## 2020-10-17 DIAGNOSIS — N400 Enlarged prostate without lower urinary tract symptoms: Secondary | ICD-10-CM | POA: Diagnosis not present

## 2020-10-17 DIAGNOSIS — K219 Gastro-esophageal reflux disease without esophagitis: Secondary | ICD-10-CM | POA: Diagnosis not present

## 2020-10-17 DIAGNOSIS — D649 Anemia, unspecified: Secondary | ICD-10-CM

## 2020-10-17 DIAGNOSIS — M1000 Idiopathic gout, unspecified site: Secondary | ICD-10-CM

## 2020-10-17 DIAGNOSIS — I2583 Coronary atherosclerosis due to lipid rich plaque: Secondary | ICD-10-CM

## 2020-10-17 DIAGNOSIS — E782 Mixed hyperlipidemia: Secondary | ICD-10-CM

## 2020-10-17 LAB — MICROALBUMIN, URINE WAIVED
Creatinine, Urine Waived: 200 mg/dL (ref 10–300)
Microalb, Ur Waived: 10 mg/L (ref 0–19)
Microalb/Creat Ratio: <30 mg/g (ref <30)

## 2020-10-17 MED ORDER — MELOXICAM 15 MG PO TABS: 15.0000 mg | ORAL_TABLET | Freq: Every day | ORAL | 1 refills | Status: DC

## 2020-10-17 NOTE — Assessment & Plan Note (Signed)
Under good control on current regimen. Continue current regimen. Continue to monitor. Call with any concerns. Refills up to date. Labs drawn today.  

## 2020-10-17 NOTE — Assessment & Plan Note (Signed)
Rechecking labs today. Await results. Treat as needed.  °

## 2020-10-17 NOTE — Assessment & Plan Note (Signed)
Will keep BP and cholesterol under good control. Continue plavix. Call with any concerns.

## 2020-10-17 NOTE — Progress Notes (Signed)
BP 116/63   Pulse 60   Temp 98 F (36.7 C)   Wt 190 lb 12.8 oz (86.5 kg)   SpO2 95%   BMI 27.50 kg/m    Subjective:    Patient ID: Mark Quinn, male    DOB: [birth year], 75 y.o.   MRN: [id number]  HPI: Mark Quinn is a 75 y.o. male  Chief Complaint  Patient presents with  . Hypertension  . Coronary Artery Disease  . Gastroesophageal Reflux  . Hyperlipidemia   HYPERTENSION / HYPERLIPIDEMIA Satisfied with current treatment? yes Duration of hypertension: chronic BP monitoring frequency: not checking BP medication side effects: no Past BP meds: lisinopril, metoprolol Duration of hyperlipidemia: chronic Cholesterol medication side effects: no Cholesterol supplements:  Past cholesterol medications: vytorin Medication compliance: excellent compliance Aspirin: no Recent stressors: no Recurrent headaches: no Visual changes: no Palpitations: no Dyspnea: no Chest pain: no Lower extremity edema: no Dizzy/lightheaded: no  No gout flares. Tolerating his medicine well.   GERD GERD control status: controlled  Satisfied with current treatment? yes Heartburn frequency: occasionally Medication side effects: no  Medication compliance: stable Dysphagia: no Odynophagia:  no Hematemesis: no Blood in stool: no EGD: no  BPH BPH status: controlled Satisfied with current treatment?: yes Medication side effects: no Medication compliance: excellent compliance Duration: chronic Nocturia: no Urinary frequency:no Incomplete voiding: no Urgency: no Weak urinary stream: no Straining to start stream: no Dysuria: no Onset: gradual Severity: mild   Relevant past medical, surgical, family and social history reviewed and updated as indicated. Interim medical history since our last visit reviewed. Allergies and medications reviewed and updated.  Review of Systems  Constitutional: Negative.   Respiratory: Negative.   Cardiovascular: Negative.   Gastrointestinal:  Negative.   Musculoskeletal: Negative.   Neurological: Negative.   Psychiatric/Behavioral: Negative.     Per HPI unless specifically indicated above     Objective:    BP 116/63   Pulse 60   Temp 98 F (36.7 C)   Wt 190 lb 12.8 oz (86.5 kg)   SpO2 95%   BMI 27.50 kg/m   Wt Readings from Last 3 Encounters:  10/17/20 190 lb 12.8 oz (86.5 kg)  07/19/20 191 lb 6.4 oz (86.8 kg)  04/04/20 191 lb (86.6 kg)    Physical Exam Vitals and nursing note reviewed.  Constitutional:      General: He is not in acute distress.    Appearance: Normal appearance. He is not ill-appearing, toxic-appearing or diaphoretic.  HENT:     Head: Normocephalic and atraumatic.     Right Ear: External ear normal.     Left Ear: External ear normal.     Nose: Nose normal.     Mouth/Throat:     Mouth: Mucous membranes are moist.     Pharynx: Oropharynx is clear.  Eyes:     General: No scleral icterus.       Right eye: No discharge.        Left eye: No discharge.     Extraocular Movements: Extraocular movements intact.     Conjunctiva/sclera: Conjunctivae normal.     Pupils: Pupils are equal, round, and reactive to light.  Cardiovascular:     Rate and Rhythm: Normal rate and regular rhythm.     Pulses: Normal pulses.     Heart sounds: Normal heart sounds. No murmur heard. No friction rub. No gallop.   Pulmonary:     Effort: Pulmonary effort is normal. No respiratory distress.  Breath sounds: Normal breath sounds. No stridor. No wheezing, rhonchi or rales.  Chest:     Chest wall: No tenderness.  Musculoskeletal:        General: Normal range of motion.     Cervical back: Normal range of motion and neck supple.  Skin:    General: Skin is warm and dry.     Capillary Refill: Capillary refill takes less than 2 seconds.     Coloration: Skin is not jaundiced or pale.     Findings: No bruising, erythema, lesion or rash.  Neurological:     General: No focal deficit present.     Mental Status: He is  alert and oriented to person, place, and time. Mental status is at baseline.  Psychiatric:        Mood and Affect: Mood normal.        Behavior: Behavior normal.        Thought Content: Thought content normal.        Judgment: Judgment normal.     Results for orders placed or performed in visit on 04/04/20  CBC with Differential/Platelet  Result Value Ref Range   WBC 11.0 (H) 3.4 - 10.8 x10E3/uL   RBC 3.87 (L) 4.14 - 5.80 x10E6/uL   Hemoglobin 12.6 (L) 13.0 - 17.7 g/dL   Hematocrit 36.1 (L) 37.5 - 51.0 %   MCV 93 79 - 97 fL   MCH 32.6 26.6 - 33.0 pg   MCHC 34.9 31.5 - 35.7 g/dL   RDW 13.0 11.6 - 15.4 %   Platelets 246 150 - 450 x10E3/uL   Neutrophils 72 Not Estab. %   Lymphs 19 Not Estab. %   Monocytes 7 Not Estab. %   Eos 2 Not Estab. %   Basos 0 Not Estab. %   Neutrophils Absolute 7.9 (H) 1.4 - 7.0 x10E3/uL   Lymphocytes Absolute 2.1 0.7 - 3.1 x10E3/uL   Monocytes Absolute 0.8 0.1 - 0.9 x10E3/uL   EOS (ABSOLUTE) 0.2 0.0 - 0.4 x10E3/uL   Basophils Absolute 0.0 0.0 - 0.2 x10E3/uL   Immature Granulocytes 0 Not Estab. %   Immature Grans (Abs) 0.0 0.0 - 0.1 x10E3/uL  Comprehensive metabolic panel  Result Value Ref Range   Glucose 91 65 - 99 mg/dL   BUN 18 8 - 27 mg/dL   Creatinine, Ser 1.01 0.76 - 1.27 mg/dL   GFR calc non Af Amer 74 >59 mL/min/1.73   GFR calc Af Amer 86 >59 mL/min/1.73   BUN/Creatinine Ratio 18 10 - 24   Sodium 137 134 - 144 mmol/L   Potassium 4.8 3.5 - 5.2 mmol/L   Chloride 100 96 - 106 mmol/L   CO2 24 20 - 29 mmol/L   Calcium 9.8 8.6 - 10.2 mg/dL   Total Protein 7.2 6.0 - 8.5 g/dL   Albumin 4.6 3.7 - 4.7 g/dL   Globulin, Total 2.6 1.5 - 4.5 g/dL   Albumin/Globulin Ratio 1.8 1.2 - 2.2   Bilirubin Total 0.4 0.0 - 1.2 mg/dL   Alkaline Phosphatase 63 48 - 121 IU/L   AST 22 0 - 40 IU/L   ALT 25 0 - 44 IU/L  Lipid Panel w/o Chol/HDL Ratio  Result Value Ref Range   Cholesterol, Total 167 100 - 199 mg/dL   Triglycerides 213 (H) 0 - 149 mg/dL   HDL 43  >39 mg/dL   VLDL Cholesterol Cal 36 5 - 40 mg/dL   LDL Chol Calc (NIH) 88 0 - 99 mg/dL  TSH  Result Value Ref Range   TSH 1.980 0.450 - 4.500 uIU/mL  PSA  Result Value Ref Range   Prostate Specific Ag, Serum 2.4 0.0 - 4.0 ng/mL  Uric acid  Result Value Ref Range   Uric Acid 5.5 3.8 - 8.4 mg/dL  Magnesium  Result Value Ref Range   Magnesium 2.0 1.6 - 2.3 mg/dL      Assessment & Plan:   Problem List Items Addressed This Visit      Cardiovascular and Mediastinum   Essential hypertension, benign - Primary    Under good control on current regimen. Continue current regimen. Continue to monitor. Call with any concerns. Refills up to date. Labs drawn today.         Relevant Orders   CBC with Differential/Platelet   Comprehensive metabolic panel   Microalbumin, Urine Waived   CAD (coronary artery disease)    Will keep BP and cholesterol under good control. Continue plavix. Call with any concerns.       Relevant Orders   CBC with Differential/Platelet   Comprehensive metabolic panel     Digestive   Gastroesophageal reflux disease without esophagitis    Under good control on current regimen. Continue current regimen. Continue to monitor. Call with any concerns. Refills up to date. Labs drawn today.         Relevant Orders   CBC with Differential/Platelet   Comprehensive metabolic panel     Genitourinary   BPH (benign prostatic hyperplasia)    Under good control on current regimen. Continue current regimen. Continue to monitor. Call with any concerns. Refills up to date. Labs drawn today.         Relevant Orders   CBC with Differential/Platelet   Comprehensive metabolic panel   PSA     Other   Hyperlipemia    Under good control on current regimen. Continue current regimen. Continue to monitor. Call with any concerns. Refills up to date. Labs drawn today.         Relevant Orders   CBC with Differential/Platelet   Comprehensive metabolic panel   Lipid Panel w/o  Chol/HDL Ratio   Gout    Under good control on current regimen. Continue current regimen. Continue to monitor. Call with any concerns. Refills up to date. Labs drawn today.         Relevant Orders   CBC with Differential/Platelet   Comprehensive metabolic panel   Uric acid   Anemia, unspecified    Rechecking labs today. Await results. Treat as needed.       Relevant Orders   CBC with Differential/Platelet   Comprehensive metabolic panel       Follow up plan: Return in about 6 months (around 04/16/2021) for physical.

## 2020-10-18 LAB — URIC ACID: Uric Acid: 5.5 mg/dL (ref 3.8–8.4)

## 2020-10-18 LAB — COMPREHENSIVE METABOLIC PANEL
ALT: 30 IU/L (ref 0–44)
AST: 25 IU/L (ref 0–40)
Albumin/Globulin Ratio: 1.7 (ref 1.2–2.2)
Albumin: 4.3 g/dL (ref 3.7–4.7)
Alkaline Phosphatase: 58 IU/L (ref 44–121)
BUN/Creatinine Ratio: 17 (ref 10–24)
BUN: 15 mg/dL (ref 8–27)
Bilirubin Total: 0.5 mg/dL (ref 0.0–1.2)
CO2: 21 mmol/L (ref 20–29)
Calcium: 9.5 mg/dL (ref 8.6–10.2)
Chloride: 101 mmol/L (ref 96–106)
Creatinine, Ser: 0.86 mg/dL (ref 0.76–1.27)
GFR calc Af Amer: 100 mL/min/{1.73_m2} (ref >59)
GFR calc non Af Amer: 87 mL/min/{1.73_m2} (ref >59)
Globulin, Total: 2.6 g/dL (ref 1.5–4.5)
Glucose: 92 mg/dL (ref 65–99)
Potassium: 4.3 mmol/L (ref 3.5–5.2)
Sodium: 137 mmol/L (ref 134–144)
Total Protein: 6.9 g/dL (ref 6.0–8.5)

## 2020-10-18 LAB — CBC WITH DIFFERENTIAL/PLATELET
Basophils Absolute: 0 10*3/uL (ref 0.0–0.2)
Basos: 0 %
EOS (ABSOLUTE): 0.2 10*3/uL (ref 0.0–0.4)
Eos: 3 %
Hematocrit: 38 % (ref 37.5–51.0)
Hemoglobin: 13 g/dL (ref 13.0–17.7)
Immature Grans (Abs): 0 10*3/uL (ref 0.0–0.1)
Immature Granulocytes: 0 %
Lymphocytes Absolute: 1.9 10*3/uL (ref 0.7–3.1)
Lymphs: 23 %
MCH: 32.3 pg (ref 26.6–33.0)
MCHC: 34.2 g/dL (ref 31.5–35.7)
MCV: 94 fL (ref 79–97)
Monocytes Absolute: 0.7 10*3/uL (ref 0.1–0.9)
Monocytes: 8 %
Neutrophils Absolute: 5.6 10*3/uL (ref 1.4–7.0)
Neutrophils: 66 %
Platelets: 250 10*3/uL (ref 150–450)
RBC: 4.03 x10E6/uL — ABNORMAL LOW (ref 4.14–5.80)
RDW: 12.9 % (ref 11.6–15.4)
WBC: 8.4 10*3/uL (ref 3.4–10.8)

## 2020-10-18 LAB — LIPID PANEL W/O CHOL/HDL RATIO
Cholesterol, Total: 179 mg/dL (ref 100–199)
HDL: 43 mg/dL (ref >39)
LDL Chol Calc (NIH): 101 mg/dL — ABNORMAL HIGH (ref 0–99)
Triglycerides: 201 mg/dL — ABNORMAL HIGH (ref 0–149)
VLDL Cholesterol Cal: 35 mg/dL (ref 5–40)

## 2020-10-18 LAB — PSA: Prostate Specific Ag, Serum: 1.3 ng/mL (ref 0.0–4.0)

## 2021-02-06 ENCOUNTER — Other Ambulatory Visit: Payer: Self-pay | Admitting: Nurse Practitioner

## 2021-03-22 ENCOUNTER — Telehealth: Payer: Self-pay | Admitting: Internal Medicine

## 2021-03-22 NOTE — Telephone Encounter (Signed)
Copied from [place] [id number]. Topic: Medicare AWV >> Mar 22, 2021  1:06 PM Cher Nakai R wrote: Reason for CRM:  Left message for patient to call back and schedule the Medicare Annual Wellness Visit (AWV) virtually or by telephone.  Last AWV 11/10/2015  Please schedule at anytime with [facility] Health Advisor.  45 minute appointment  Any questions, please call me at [phone]

## 2021-04-17 ENCOUNTER — Encounter: Payer: Medicare Other | Admitting: Internal Medicine

## 2021-04-17 ENCOUNTER — Telehealth: Payer: Medicare Other | Admitting: Internal Medicine

## 2021-04-17 ENCOUNTER — Other Ambulatory Visit: Payer: Self-pay | Admitting: Nurse Practitioner

## 2021-04-17 NOTE — Telephone Encounter (Signed)
Requested medication (s) are due for refill today: expired medication  Requested medication (s) are on the active medication list: yes  Last refill:  04/04/20 #90 4 refills  Future visit scheduled: yes in 1 month  Notes to clinic:  expired medication. Do you want to renew Rx?     Requested Prescriptions  Pending Prescriptions Disp Refills   RABEprazole (ACIPHEX) 20 MG tablet [Pharmacy Med Name: RABEPRAZOLE SOD DR 20 MG TAB] 90 tablet 0    Sig: TAKE 1 TABLET EVERY DAY     Gastroenterology: Proton Pump Inhibitors Passed - 04/17/2021  1:10 PM      Passed - Valid encounter within last 12 months    Recent Outpatient Visits           6 months ago Essential hypertension, benign   [place], Megan P, DO   9 months ago Cellulitis of right hand   [facility] Eulogio Bear, NP   1 year ago Routine general medical examination at a health care facility   [facility], Henrine Screws T, NP   1 year ago Essential hypertension, benign   [place], Henrine Screws T, NP   2 years ago Encounter for annual physical exam   [place], Jolene T, NP       Future Appointments             In 1 month Vigg, Avanti, MD [facility], [place]

## 2021-04-17 NOTE — Telephone Encounter (Signed)
Patient last seen 10/17/20 and has appointment 05/22/21

## 2021-04-17 NOTE — Telephone Encounter (Signed)
Requested medication (s) are due for refill today - yes- expired Rx  Requested medication (s) are on the active medication list -yes  Future visit scheduled -yes  Last refill: 04/04/20 #90 4 RF  Notes to clinic: Rx passed protocol for RF- expired Rx- sent for review   Requested Prescriptions  Pending Prescriptions Disp Refills   finasteride (PROSCAR) 5 MG tablet [Pharmacy Med Name: FINASTERIDE 5 MG TABLET] 90 tablet 0    Sig: TAKE [place]     Urology: 5-alpha Reductase Inhibitors Passed - 04/17/2021  1:09 PM      Passed - Valid encounter within last 12 months    Recent Outpatient Visits           6 months ago Essential hypertension, benign   [place], Megan P, DO   9 months ago Cellulitis of right hand   [facility] Eulogio Bear, NP   1 year ago Routine general medical examination at a health care facility   [facility], Henrine Screws T, NP   1 year ago Essential hypertension, benign   [place], Iva T, NP   2 years ago Encounter for annual physical exam   [place], Barbaraann Faster, NP       Future Appointments             In 1 month Vigg, Avanti, MD [facility]               Requested Prescriptions  Pending Prescriptions Disp Refills   finasteride (PROSCAR) 5 MG tablet [Pharmacy Med Name: FINASTERIDE 5 MG TABLET] 90 tablet 0    Sig: TAKE [place]     Urology: 5-alpha Reductase Inhibitors Passed - 04/17/2021  1:09 PM      Passed - Valid encounter within last 12 months    Recent Outpatient Visits           6 months ago Essential hypertension, benign   [place], Megan P, DO   9 months ago Cellulitis of right hand   [facility] Eulogio Bear, NP   1 year ago Routine general medical examination at a health care facility   [facility], Henrine Screws T, NP   1 year ago  Essential hypertension, benign   [place], [place]fir T, NP   2 years ago Encounter for annual physical exam   [place], Barbaraann Faster, NP       Future Appointments             In 1 month Vigg, Avanti, MD [facility], [place]

## 2021-04-17 NOTE — Telephone Encounter (Signed)
Error/ltd ° °

## 2021-05-08 ENCOUNTER — Other Ambulatory Visit: Payer: Self-pay | Admitting: Nurse Practitioner

## 2021-05-08 DIAGNOSIS — E782 Mixed hyperlipidemia: Secondary | ICD-10-CM

## 2021-05-08 DIAGNOSIS — I10 Essential (primary) hypertension: Secondary | ICD-10-CM

## 2021-05-08 DIAGNOSIS — M1000 Idiopathic gout, unspecified site: Secondary | ICD-10-CM

## 2021-05-08 NOTE — Telephone Encounter (Signed)
Requested medication (s) are due for refill today: expired medications  Requested medication (s) are on the active medication list: yes  Last refill:  04/04/20 #90 4 refills  Future visit scheduled: yes in 2 weeks  Notes to clinic:  expired medications do you want courtesy refill? Last hbg. 10/17/20     Requested Prescriptions  Pending Prescriptions Disp Refills   metoprolol succinate (TOPROL-XL) 50 MG 24 hr tablet [Pharmacy Med Name: METOPROLOL SUCC ER 50 MG TAB] 90 tablet 0    Sig: TAKE 1 TABLET DAILY WITH OR IMMEDIATELY FOLLOWING A MEAL     Cardiovascular:  Beta Blockers Failed - 05/08/2021  1:24 PM      Failed - Valid encounter within last 6 months    Recent Outpatient Visits           6 months ago Essential hypertension, benign   [place], Megan P, DO   9 months ago Cellulitis of right hand   [facility] Eulogio Bear, NP   1 year ago Routine general medical examination at a health care facility   [facility], Henrine Screws T, NP   1 year ago Essential hypertension, benign   [place], [place] T, NP   2 years ago Encounter for annual physical exam   [place], Henrine Screws T, NP       Future Appointments             In 2 weeks Vigg, Avanti, MD [facility]            Passed - Last BP in normal range    BP Readings from Last 1 Encounters:  10/17/20 116/63          Passed - Last Heart Rate in normal range    Pulse Readings from Last 1 Encounters:  10/17/20 60           clopidogrel (PLAVIX) 75 MG tablet [Pharmacy Med Name: CLOPIDOGREL 75 MG TABLET] 90 tablet 0    Sig: TAKE 1 TABLET BY [place]     Hematology: Antiplatelets - clopidogrel Failed - 05/08/2021  1:24 PM      Failed - Evaluate AST, ALT within 2 months of therapy initiation.      Failed - HCT in normal range and within 180 days    Hematocrit  Date Value Ref Range Status   10/17/2020 38.0 37.5 - 51.0 % Final          Failed - HGB in normal range and within 180 days    Hemoglobin  Date Value Ref Range Status  10/17/2020 13.0 13.0 - 17.7 g/dL Final          Failed - PLT in normal range and within 180 days    Platelets  Date Value Ref Range Status  10/17/2020 250 150 - 450 x10E3/uL Final          Failed - Valid encounter within last 6 months    Recent Outpatient Visits           6 months ago Essential hypertension, benign   [place], Megan P, DO   9 months ago Cellulitis of right hand   [facility] Eulogio Bear, NP   1 year ago Routine general medical examination at a health care facility   [facility], Henrine Screws T, NP   1 year ago Essential hypertension, benign   [place],  Barbaraann Faster, NP   2 years ago Encounter for annual physical exam   [place], Barbaraann Faster, NP       Future Appointments             In 2 weeks Vigg, Avanti, MD [facility], [facility]            Passed - ALT in normal range and within 360 days    ALT  Date Value Ref Range Status  10/17/2020 30 0 - 44 IU/L Final   ALT (SGPT) Piccolo, Waived  Date Value Ref Range Status  05/29/2018 31 10 - 47 U/L Final          Passed - AST in normal range and within 360 days    AST  Date Value Ref Range Status  10/17/2020 25 0 - 40 IU/L Final   AST (SGOT) Piccolo, Waived  Date Value Ref Range Status  05/29/2018 33 11 - 38 U/L Final           allopurinol (ZYLOPRIM) 100 MG tablet [Pharmacy Med Name: ALLOPURINOL 100 MG TABLET] 90 tablet 0    Sig: TAKE 1 [place]     Endocrinology:  Gout Agents Passed - 05/08/2021  1:24 PM      Passed - Uric Acid in normal range and within 360 days    Uric Acid  Date Value Ref Range Status  10/17/2020 5.5 3.8 - 8.4 mg/dL Final    Comment:               Therapeutic target for gout patients: <6.0          Passed  - Cr in normal range and within 360 days    Creatinine, Ser  Date Value Ref Range Status  10/17/2020 0.86 0.76 - 1.27 mg/dL Final    Comment:                   **Effective October 24, 2020 [facility] will begin**                  reporting the 2021 CKD-EPI creatinine equation that                  estimates kidney function without a race variable.           Passed - Valid encounter within last 12 months    Recent Outpatient Visits           6 months ago Essential hypertension, benign   [place], [place], DO   9 months ago Cellulitis of right hand   [facility] Eulogio Bear, NP   1 year ago Routine general medical examination at a health care facility   [facility], Henrine Screws T, NP   1 year ago Essential hypertension, benign   [place], Henrine Screws T, NP   2 years ago Encounter for annual physical exam   [place], Barbaraann Faster, NP       Future Appointments             In 2 weeks Vigg, Avanti, MD [facility]             ezetimibe-simvastatin (VYTORIN) 10-40 MG tablet [Pharmacy Med Name: EZETIMIBE-SIMV 10-40 MG] 90 tablet 0    Sig: TAKE 1 TABLET EVERY DAY     Cardiovascular:  Antilipid - Sterol Transport Inhibitors Failed - 05/08/2021  1:24 PM  Failed - LDL in normal range and within 360 days    LDL Chol Calc (NIH)  Date Value Ref Range Status  10/17/2020 101 (H) 0 - 99 mg/dL Final          Failed - Triglycerides in normal range and within 360 days    Triglycerides  Date Value Ref Range Status  10/17/2020 201 (H) 0 - 149 mg/dL Final   Triglycerides Piccolo,Waived  Date Value Ref Range Status  05/29/2018 229 (H) <150 mg/dL Final    Comment:                            Normal                   <150                         Borderline High     150 - 199                         High                200 - 499                         Very High                 >499           Passed - Total Cholesterol in normal range and within 360 days    Cholesterol, Total  Date Value Ref Range Status  10/17/2020 179 100 - 199 mg/dL Final   Cholesterol Piccolo, Waived  Date Value Ref Range Status  05/29/2018 160 <200 mg/dL Final    Comment:                            Desirable                <200                         Borderline High      200- 239                         High                     >239           Passed - HDL in normal range and within 360 days    HDL  Date Value Ref Range Status  10/17/2020 43 >39 mg/dL Final          Passed - Valid encounter within last 12 months    Recent Outpatient Visits           6 months ago Essential hypertension, benign   [place], Megan P, DO   9 months ago Cellulitis of right hand   [facility] Eulogio Bear, NP   1 year ago Routine general medical examination at a health care facility   [facility], Henrine Screws T, NP   1 year ago Essential hypertension, benign   [place], Henrine Screws T, NP   2 years ago Encounter for annual physical exam   [facility], Br[place]  T, NP       Future Appointments             In 2 weeks Vigg, Avanti, MD [facility], [facility]

## 2021-05-11 ENCOUNTER — Other Ambulatory Visit: Payer: Self-pay | Admitting: Family Medicine

## 2021-05-12 ENCOUNTER — Other Ambulatory Visit: Payer: Self-pay | Admitting: Nurse Practitioner

## 2021-05-12 DIAGNOSIS — I10 Essential (primary) hypertension: Secondary | ICD-10-CM

## 2021-05-22 ENCOUNTER — Other Ambulatory Visit: Payer: Self-pay

## 2021-05-22 ENCOUNTER — Ambulatory Visit (INDEPENDENT_AMBULATORY_CARE_PROVIDER_SITE_OTHER): Payer: Medicare Other | Admitting: Internal Medicine

## 2021-05-22 ENCOUNTER — Encounter: Payer: Self-pay | Admitting: Internal Medicine

## 2021-05-22 VITALS — BP 118/80 | HR 72 | Temp 99.0°F | Ht 70.08 in | Wt 185.8 lb

## 2021-05-22 DIAGNOSIS — I10 Essential (primary) hypertension: Secondary | ICD-10-CM | POA: Diagnosis not present

## 2021-05-22 DIAGNOSIS — E782 Mixed hyperlipidemia: Secondary | ICD-10-CM | POA: Diagnosis not present

## 2021-05-22 DIAGNOSIS — M1990 Unspecified osteoarthritis, unspecified site: Secondary | ICD-10-CM

## 2021-05-22 DIAGNOSIS — Z0000 Encounter for general adult medical examination without abnormal findings: Secondary | ICD-10-CM | POA: Diagnosis not present

## 2021-05-22 DIAGNOSIS — M109 Gout, unspecified: Secondary | ICD-10-CM

## 2021-05-22 LAB — URINALYSIS, ROUTINE W REFLEX MICROSCOPIC
Bilirubin, UA: NEGATIVE
Glucose, UA: NEGATIVE
Ketones, UA: NEGATIVE
Leukocytes,UA: NEGATIVE
Nitrite, UA: NEGATIVE
Protein,UA: NEGATIVE
RBC, UA: NEGATIVE
Specific Gravity, UA: 1.025 (ref 1.005–1.030)
Urobilinogen, Ur: 0.2 mg/dL (ref 0.2–1.0)
pH, UA: 5.5 (ref 5.0–7.5)

## 2021-05-22 MED ORDER — MELOXICAM 15 MG PO TABS: 15.0000 mg | ORAL_TABLET | Freq: Every day | ORAL | 1 refills | Status: DC

## 2021-05-22 NOTE — Progress Notes (Signed)
BP 118/80   Pulse 72   Temp 99 F (37.2 C) (Oral)   Ht 5' 10.08" (1.78 m)   Wt 185 lb 12.8 oz (84.3 kg)   SpO2 97%   BMI 26.60 kg/m    Subjective:    Patient ID: Mark Quinn, male    DOB: 30-Dec-1945, 75 y.o.   MRN: [id number]  Chief Complaint  Patient presents with  . Annual Exam    HPI: Mark Quinn is a 75 y.o. male  Pt is here for a physical. Is on meloxicam for    Chief Complaint  Patient presents with  . Annual Exam    Relevant past medical, surgical, family and social history reviewed and updated as indicated. Interim medical history since our last visit reviewed. Allergies and medications reviewed and updated.  Review of Systems  Per HPI unless specifically indicated above     Objective:    BP 118/80   Pulse 72   Temp 99 F (37.2 C) (Oral)   Ht 5' 10.08" (1.78 m)   Wt 185 lb 12.8 oz (84.3 kg)   SpO2 97%   BMI 26.60 kg/m   Wt Readings from Last 3 Encounters:  05/22/21 185 lb 12.8 oz (84.3 kg)  10/17/20 190 lb 12.8 oz (86.5 kg)  07/19/20 191 lb 6.4 oz (86.8 kg)    Physical Exam Vitals and nursing note reviewed.  Constitutional:      General: He is not in acute distress.    Appearance: Normal appearance. He is not ill-appearing or diaphoretic.  HENT:     Head: Normocephalic and atraumatic.  Eyes:     Conjunctiva/sclera: Conjunctivae normal.     Pupils: Pupils are equal, round, and reactive to light.  Cardiovascular:     Rate and Rhythm: Normal rate and regular rhythm.     Heart sounds: Murmur heard.    No friction rub. No gallop.  Pulmonary:     Effort: No respiratory distress.     Breath sounds: No stridor. No wheezing or rhonchi.  Chest:     Chest wall: No tenderness.  Abdominal:     General: Abdomen is flat. Bowel sounds are normal.     Palpations: Abdomen is soft. There is no mass.     Tenderness: There is no abdominal tenderness.  Musculoskeletal:        General: No swelling, tenderness, deformity or signs of injury.      Cervical back: Normal range of motion and neck supple. No rigidity or tenderness.     Right lower leg: No edema.     Left lower leg: No edema.  Skin:    General: Skin is warm and dry.  Neurological:     General: No focal deficit present.     Mental Status: He is alert and oriented to person, place, and time.     Cranial Nerves: No cranial nerve deficit.     Sensory: No sensory deficit.     Motor: No weakness.     Coordination: Coordination normal.     Gait: Gait normal.     Deep Tendon Reflexes: Reflexes normal.  Psychiatric:        Mood and Affect: Mood normal.        Behavior: Behavior normal.    Results for orders placed or performed in visit on 10/17/20  CBC with Differential/Platelet  Result Value Ref Range   WBC 8.4 3.4 - 10.8 x10E3/uL   RBC 4.03 (L) 4.14 - 5.80 x10E6/uL  Hemoglobin 13.0 13.0 - 17.7 g/dL   Hematocrit 38.0 37.5 - 51.0 %   MCV 94 79 - 97 fL   MCH 32.3 26.6 - 33.0 pg   MCHC 34.2 31.5 - 35.7 g/dL   RDW 12.9 11.6 - 15.4 %   Platelets 250 150 - 450 x10E3/uL   Neutrophils 66 Not Estab. %   Lymphs 23 Not Estab. %   Monocytes 8 Not Estab. %   Eos 3 Not Estab. %   Basos 0 Not Estab. %   Neutrophils Absolute 5.6 1.4 - 7.0 x10E3/uL   Lymphocytes Absolute 1.9 0.7 - 3.1 x10E3/uL   Monocytes Absolute 0.7 0.1 - 0.9 x10E3/uL   EOS (ABSOLUTE) 0.2 0.0 - 0.4 x10E3/uL   Basophils Absolute 0.0 0.0 - 0.2 x10E3/uL   Immature Granulocytes 0 Not Estab. %   Immature Grans (Abs) 0.0 0.0 - 0.1 x10E3/uL  Comprehensive metabolic panel  Result Value Ref Range   Glucose 92 65 - 99 mg/dL   BUN 15 8 - 27 mg/dL   Creatinine, Ser 0.86 0.76 - 1.27 mg/dL   GFR calc non Af Amer 87 >59 mL/min/1.73   GFR calc Af Amer 100 >59 mL/min/1.73   BUN/Creatinine Ratio 17 10 - 24   Sodium 137 134 - 144 mmol/L   Potassium 4.3 3.5 - 5.2 mmol/L   Chloride 101 96 - 106 mmol/L   CO2 21 20 - 29 mmol/L   Calcium 9.5 8.6 - 10.2 mg/dL   Total Protein 6.9 6.0 - 8.5 g/dL   Albumin 4.3 3.7 - 4.7  g/dL   Globulin, Total 2.6 1.5 - 4.5 g/dL   Albumin/Globulin Ratio 1.7 1.2 - 2.2   Bilirubin Total 0.5 0.0 - 1.2 mg/dL   Alkaline Phosphatase 58 44 - 121 IU/L   AST 25 0 - 40 IU/L   ALT 30 0 - 44 IU/L  Lipid Panel w/o Chol/HDL Ratio  Result Value Ref Range   Cholesterol, Total 179 100 - 199 mg/dL   Triglycerides 201 (H) 0 - 149 mg/dL   HDL 43 >39 mg/dL   VLDL Cholesterol Cal 35 5 - 40 mg/dL   LDL Chol Calc (NIH) 101 (H) 0 - 99 mg/dL  Microalbumin, Urine Waived  Result Value Ref Range   Microalb, Ur Waived 10 0 - 19 mg/L   Creatinine, Urine Waived 200 10 - 300 mg/dL   Microalb/Creat Ratio <30 <30 mg/g  PSA  Result Value Ref Range   Prostate Specific Ag, Serum 1.3 0.0 - 4.0 ng/mL  Uric acid  Result Value Ref Range   Uric Acid 5.5 3.8 - 8.4 mg/dL        Current Outpatient Medications:  .  allopurinol (ZYLOPRIM) 100 MG tablet, TAKE 1 TABLET EVERY DAY, Disp: 90 tablet, Rfl: 0 .  clopidogrel (PLAVIX) 75 MG tablet, TAKE 1 TABLET BY MOUTH EVERY DAY, Disp: 90 tablet, Rfl: 0 .  diphenhydramine-acetaminophen (TYLENOL PM) 25-500 MG TABS tablet, Take 1 tablet by mouth at bedtime as needed., Disp: , Rfl:  .  ezetimibe-simvastatin (VYTORIN) 10-40 MG tablet, TAKE 1 TABLET EVERY DAY, Disp: 90 tablet, Rfl: 0 .  finasteride (PROSCAR) 5 MG tablet, TAKE 1 TABLET EVERY DAY, Disp: 90 tablet, Rfl: 0 .  lisinopril (ZESTRIL) 20 MG tablet, TAKE 1 TABLET EVERY DAY, Disp: 90 tablet, Rfl: 0 .  meloxicam (MOBIC) 15 MG tablet, TAKE 1 TABLET EVERY DAY, Disp: 90 tablet, Rfl: 0 .  methocarbamol (ROBAXIN) 500 MG tablet, Take 500 mg by mouth 4 (  four) times daily as needed., Disp: , Rfl: 1 .  metoprolol succinate (TOPROL-XL) 50 MG 24 hr tablet, TAKE 1 TABLET DAILY WITH OR IMMEDIATELY FOLLOWING A MEAL, Disp: 90 tablet, Rfl: 0 .  Multiple Vitamin (MULTIVITAMIN) tablet, Take 1 tablet by mouth daily., Disp: , Rfl:  .  RABEprazole (ACIPHEX) 20 MG tablet, TAKE 1 TABLET EVERY DAY (Patient not taking: Reported on  05/22/2021), Disp: 90 tablet, Rfl: 0    Assessment & Plan:  Gout on allopurinol for maintaninece.  He c/o gout in his big toes no flare ups  2. GERD stable, off of rabeprazole.   3.  HTN : on toprol xl and liisnopril for such  Continue current meds.  Medication compliance emphasised. pt advised to keep Bp logs. Pt verbalised understanding of the same. Pt to have a low salt diet . Exercise to reach a goal of at least 150 mins a week.  lifestyle modifications explained and pt understands importance of the above.  4. HLD on vytroin zetia and simvastatin for such    5. CAD s/p Stenting x 2006  Is on plavix for such  Not on aspirin  To fu with cards asap.  Problem List Items Addressed This Visit   None    No orders of the defined types were placed in this encounter.    No orders of the defined types were placed in this encounter.    Follow up plan: No follow-ups on file.  Health Maintenance  PSA : 2022 Cscope :2015 next due in 10 yrs.  Pneumonia vaccine : prenvanar / pneumovax  FLu vaccine : lzst week   Labs next visit : CBC, CMP, FLP,TSH, abs 1 week prior to next visit.

## 2021-05-23 LAB — COMPREHENSIVE METABOLIC PANEL
ALT: 22 IU/L (ref 0–44)
AST: 23 IU/L (ref 0–40)
Albumin/Globulin Ratio: 1.7 (ref 1.2–2.2)
Albumin: 4.6 g/dL (ref 3.7–4.7)
Alkaline Phosphatase: 60 IU/L (ref 44–121)
BUN/Creatinine Ratio: 17 (ref 10–24)
BUN: 18 mg/dL (ref 8–27)
Bilirubin Total: 0.5 mg/dL (ref 0.0–1.2)
CO2: 19 mmol/L — ABNORMAL LOW (ref 20–29)
Calcium: 10.2 mg/dL (ref 8.6–10.2)
Chloride: 102 mmol/L (ref 96–106)
Creatinine, Ser: 1.05 mg/dL (ref 0.76–1.27)
Globulin, Total: 2.7 g/dL (ref 1.5–4.5)
Glucose: 90 mg/dL (ref 70–99)
Potassium: 4.8 mmol/L (ref 3.5–5.2)
Sodium: 142 mmol/L (ref 134–144)
Total Protein: 7.3 g/dL (ref 6.0–8.5)
eGFR: 75 mL/min/{1.73_m2} (ref >59)

## 2021-05-23 LAB — CBC WITH DIFFERENTIAL/PLATELET
Basophils Absolute: 0 10*3/uL (ref 0.0–0.2)
Basos: 0 %
EOS (ABSOLUTE): 0.3 10*3/uL (ref 0.0–0.4)
Eos: 4 %
Hematocrit: 42.8 % (ref 37.5–51.0)
Hemoglobin: 14.1 g/dL (ref 13.0–17.7)
Immature Grans (Abs): 0 10*3/uL (ref 0.0–0.1)
Immature Granulocytes: 0 %
Lymphocytes Absolute: 2.3 10*3/uL (ref 0.7–3.1)
Lymphs: 30 %
MCH: 31.4 pg (ref 26.6–33.0)
MCHC: 32.9 g/dL (ref 31.5–35.7)
MCV: 95 fL (ref 79–97)
Monocytes Absolute: 0.5 10*3/uL (ref 0.1–0.9)
Monocytes: 7 %
Neutrophils Absolute: 4.5 10*3/uL (ref 1.4–7.0)
Neutrophils: 59 %
Platelets: 262 10*3/uL (ref 150–450)
RBC: 4.49 x10E6/uL (ref 4.14–5.80)
RDW: 12.7 % (ref 11.6–15.4)
WBC: 7.6 10*3/uL (ref 3.4–10.8)

## 2021-05-23 LAB — LIPID PANEL
Chol/HDL Ratio: 4.4 ratio (ref 0.0–5.0)
Cholesterol, Total: 198 mg/dL (ref 100–199)
HDL: 45 mg/dL (ref >39)
LDL Chol Calc (NIH): 116 mg/dL — ABNORMAL HIGH (ref 0–99)
Triglycerides: 211 mg/dL — ABNORMAL HIGH (ref 0–149)
VLDL Cholesterol Cal: 37 mg/dL (ref 5–40)

## 2021-05-23 LAB — PSA: Prostate Specific Ag, Serum: 1.7 ng/mL (ref 0.0–4.0)

## 2021-05-23 LAB — URIC ACID: Uric Acid: 7.1 mg/dL (ref 3.8–8.4)

## 2021-05-23 LAB — TSH: TSH: 1.51 u[IU]/mL (ref 0.450–4.500)

## 2021-05-29 ENCOUNTER — Telehealth: Payer: Self-pay | Admitting: *Deleted

## 2021-05-29 ENCOUNTER — Other Ambulatory Visit: Payer: Self-pay | Admitting: Nurse Practitioner

## 2021-05-29 ENCOUNTER — Other Ambulatory Visit: Payer: Self-pay | Admitting: Internal Medicine

## 2021-05-29 DIAGNOSIS — M1000 Idiopathic gout, unspecified site: Secondary | ICD-10-CM

## 2021-05-29 DIAGNOSIS — I10 Essential (primary) hypertension: Secondary | ICD-10-CM

## 2021-05-29 DIAGNOSIS — E782 Mixed hyperlipidemia: Secondary | ICD-10-CM

## 2021-05-30 NOTE — Telephone Encounter (Signed)
In error

## 2021-07-19 ENCOUNTER — Encounter: Payer: Self-pay | Admitting: Internal Medicine

## 2021-07-19 ENCOUNTER — Ambulatory Visit: Payer: Self-pay | Admitting: *Deleted

## 2021-07-19 NOTE — Telephone Encounter (Signed)
Appointment scheduled.

## 2021-07-19 NOTE — Telephone Encounter (Signed)
Pts wife called in stating pt has a cold and wanted to to speak with a nurse about what he can take. Please advise.  Reason for Disposition  [1] Continuous (nonstop) coughing interferes with work or school AND [2] no improvement using cough treatment per Care Advice  Answer Assessment - Initial Assessment Questions 1. ONSET: "When did the cough begin?"     Monday evening 2. SEVERITY: "How bad is the cough today?"      Coughing during the day and at night 3. SPUTUM: "Describe the color of your sputum" (none, dry cough; clear, white, yellow, green)     Clear.white 4. HEMOPTYSIS: "Are you coughing up any blood?" If so ask: "How much?" (flecks, streaks, tablespoons, etc.)     no 5. DIFFICULTY BREATHING: "Are you having difficulty breathing?" If Yes, ask: "How bad is it?" (e.g., mild, moderate, severe)    - MILD: No SOB at rest, mild SOB with walking, speaks normally in sentences, can lie down, no retractions, pulse < 100.    - MODERATE: SOB at rest, SOB with minimal exertion and prefers to sit, cannot lie down flat, speaks in phrases, mild retractions, audible wheezing, pulse 100-120.    - SEVERE: Very SOB at rest, speaks in single words, struggling to breathe, sitting hunched forward, retractions, pulse > 120      normal 6. FEVER: "Do you have a fever?" If Yes, ask: "What is your temperature, how was it measured, and when did it start?"     no 7. CARDIAC HISTORY: "Do you have any history of heart disease?" (e.g., heart attack, congestive heart failure)      Hx heart disease 8. LUNG HISTORY: "Do you have any history of lung disease?"  (e.g., pulmonary embolus, asthma, emphysema)     no 9. PE RISK FACTORS: "Do you have a history of blood clots?" (or: recent major surgery, recent prolonged travel, bedridden)     Yes-before the stint 10. OTHER SYMPTOMS: "Do you have any other symptoms?" (e.g., runny nose, wheezing, chest pain)       Nasal congestion, stomach sore from cough 11. PREGNANCY: "Is  there any chance you are pregnant?" "When was your last menstrual period?"       na 12. TRAVEL: "Have you traveled out of the country in the last month?" (e.g., travel history, exposures)       no  Protocols used: Cough - Acute Productive-A-AH

## 2021-07-19 NOTE — Telephone Encounter (Signed)
Patient started with cough and congestion Monday. Patient's wife is calling to request a cough medication to calm his cough. Patient advised do home COVID test and call back if + COVID. Patient may need to do virtual visit for symptoms and medications for cough- advised virtual visit- no open appointment in office. Will send message to PCP regarding request for cough medication- but patient may need appointment.

## 2021-07-24 ENCOUNTER — Telehealth (INDEPENDENT_AMBULATORY_CARE_PROVIDER_SITE_OTHER): Payer: Medicare Other | Admitting: Internal Medicine

## 2021-07-24 ENCOUNTER — Encounter: Payer: Self-pay | Admitting: Internal Medicine

## 2021-07-24 DIAGNOSIS — J0100 Acute maxillary sinusitis, unspecified: Secondary | ICD-10-CM | POA: Insufficient documentation

## 2021-07-24 DIAGNOSIS — I10 Essential (primary) hypertension: Secondary | ICD-10-CM

## 2021-07-24 MED ORDER — LISINOPRIL 40 MG PO TABS: 40.0000 mg | ORAL_TABLET | Freq: Every day | ORAL | 3 refills | Status: DC

## 2021-07-24 MED ORDER — BENZONATATE 100 MG PO CAPS: 100.0000 mg | ORAL_CAPSULE | Freq: Two times a day (BID) | ORAL | 0 refills | Status: DC | PRN

## 2021-07-24 MED ORDER — AMOXICILLIN-POT CLAVULANATE 875-125 MG PO TABS: 1.0000 | ORAL_TABLET | Freq: Two times a day (BID) | ORAL | 0 refills | Status: AC

## 2021-07-24 MED ORDER — FEXOFENADINE HCL 180 MG PO TABS: 180.0000 mg | ORAL_TABLET | Freq: Every day | ORAL | 1 refills | Status: DC

## 2021-07-24 MED ORDER — ALBUTEROL SULFATE HFA 108 (90 BASE) MCG/ACT IN AERS: 2.0000 | INHALATION_SPRAY | Freq: Four times a day (QID) | RESPIRATORY_TRACT | 1 refills | Status: DC | PRN

## 2021-07-24 NOTE — Progress Notes (Signed)
There were no vitals taken for this visit.   Subjective:    Patient ID: Mark Quinn, male    DOB: 10-25-1945, 74 y.o.   MRN: [id number]  Chief Complaint  Patient presents with   Cough    Sinus congestion, coughing up phelgm, headache,chest congestion, and stomach pain. Started last Last Monday. Has been using Daytime and Nighttime Mucinex as well as Vit. C    HPI: Mark Quinn is a 75 y.o. male   This visit was completed via video visit through MyChart due to the restrictions of the COVID-19 pandemic. All issues as above were discussed and addressed. Physical exam was done as above through visual confirmation on video through MyChart. If it was felt that the patient should be evaluated in the office, they were directed there. The patient verbally consented to this visit. Location of the patient: home Location of the provider: work Those involved with this call:  Provider: Charlynne Cousins, MD CMA: Frazier Butt, [place] Desk/Registration: [employer]  Time spent on call: 10 minutes with patient face to face via video conference. More than 50% of this time was spent in counseling and coordination of care. 10 minutes total spent in review of patient's record and preparation of their chart.    Cough This is a new (started monday , COVID test -ve) problem. The current episode started in the past 7 days (purulent green phelgm from nose and coughing up phlegm). The problem has been gradually worsening. The cough is Productive of purulent sputum. Associated symptoms include chills, headaches, nasal congestion, postnasal drip and wheezing. Pertinent negatives include no chest pain, ear congestion, ear pain, fever, heartburn, hemoptysis, myalgias, rhinorrhea, sore throat, shortness of breath, sweats or weight loss.   Chief Complaint  Patient presents with   Cough    Sinus congestion, coughing up phelgm, headache,chest congestion, and stomach pain. Started last Last Monday. Has been  using Daytime and Nighttime Mucinex as well as Vit. C    Relevant past medical, surgical, family and social history reviewed and updated as indicated. Interim medical history since our last visit reviewed. Allergies and medications reviewed and updated.  Review of Systems  Constitutional:  Positive for chills. Negative for fever and weight loss.  HENT:  Positive for postnasal drip. Negative for ear pain, rhinorrhea and sore throat.   Respiratory:  Positive for cough and wheezing. Negative for hemoptysis and shortness of breath.   Cardiovascular:  Negative for chest pain.  Gastrointestinal:  Negative for heartburn.  Musculoskeletal:  Negative for myalgias.  Neurological:  Positive for headaches.   Per HPI unless specifically indicated above     Objective:    There were no vitals taken for this visit.  Wt Readings from Last 3 Encounters:  05/22/21 185 lb 12.8 oz (84.3 kg)  10/17/20 190 lb 12.8 oz (86.5 kg)  07/19/20 191 lb 6.4 oz (86.8 kg)    Physical Exam  Results for orders placed or performed in visit on 05/22/21  TSH  Result Value Ref Range   TSH 1.510 0.450 - 4.500 uIU/mL  PSA  Result Value Ref Range   Prostate Specific Ag, Serum 1.7 0.0 - 4.0 ng/mL  Lipid panel  Result Value Ref Range   Cholesterol, Total 198 100 - 199 mg/dL   Triglycerides 211 (H) 0 - 149 mg/dL   HDL 45 >39 mg/dL   VLDL Cholesterol Cal 37 5 - 40 mg/dL   LDL Chol Calc (NIH) 116 (H) 0 - 99  mg/dL   Chol/HDL Ratio 4.4 0.0 - 5.0 ratio  CBC with Differential/Platelet  Result Value Ref Range   WBC 7.6 3.4 - 10.8 x10E3/uL   RBC 4.49 4.14 - 5.80 x10E6/uL   Hemoglobin 14.1 13.0 - 17.7 g/dL   Hematocrit 42.8 37.5 - 51.0 %   MCV 95 79 - 97 fL   MCH 31.4 26.6 - 33.0 pg   MCHC 32.9 31.5 - 35.7 g/dL   RDW 12.7 11.6 - 15.4 %   Platelets 262 150 - 450 x10E3/uL   Neutrophils 59 Not Estab. %   Lymphs 30 Not Estab. %   Monocytes 7 Not Estab. %   Eos 4 Not Estab. %   Basos 0 Not Estab. %   Neutrophils  Absolute 4.5 1.4 - 7.0 x10E3/uL   Lymphocytes Absolute 2.3 0.7 - 3.1 x10E3/uL   Monocytes Absolute 0.5 0.1 - 0.9 x10E3/uL   EOS (ABSOLUTE) 0.3 0.0 - 0.4 x10E3/uL   Basophils Absolute 0.0 0.0 - 0.2 x10E3/uL   Immature Granulocytes 0 Not Estab. %   Immature Grans (Abs) 0.0 0.0 - 0.1 x10E3/uL  Comprehensive metabolic panel  Result Value Ref Range   Glucose 90 70 - 99 mg/dL   BUN 18 8 - 27 mg/dL   Creatinine, Ser 1.05 0.76 - 1.27 mg/dL   eGFR 75 >59 mL/min/1.73   BUN/Creatinine Ratio 17 10 - 24   Sodium 142 134 - 144 mmol/L   Potassium 4.8 3.5 - 5.2 mmol/L   Chloride 102 96 - 106 mmol/L   CO2 19 (L) 20 - 29 mmol/L   Calcium 10.2 8.6 - 10.2 mg/dL   Total Protein 7.3 6.0 - 8.5 g/dL   Albumin 4.6 3.7 - 4.7 g/dL   Globulin, Total 2.7 1.5 - 4.5 g/dL   Albumin/Globulin Ratio 1.7 1.2 - 2.2   Bilirubin Total 0.5 0.0 - 1.2 mg/dL   Alkaline Phosphatase 60 44 - 121 IU/L   AST 23 0 - 40 IU/L   ALT 22 0 - 44 IU/L  Urinalysis, Routine w reflex microscopic  Result Value Ref Range   Specific Gravity, UA 1.025 1.005 - 1.030   pH, UA 5.5 5.0 - 7.5   Color, UA Yellow Yellow   Appearance Ur Clear Clear   Leukocytes,UA Negative Negative   Protein,UA Negative Negative/Trace   Glucose, UA Negative Negative   Ketones, UA Negative Negative   RBC, UA Negative Negative   Bilirubin, UA Negative Negative   Urobilinogen, Ur 0.2 0.2 - 1.0 mg/dL   Nitrite, UA Negative Negative  Uric acid  Result Value Ref Range   Uric Acid 7.1 3.8 - 8.4 mg/dL        Current Outpatient Medications:    albuterol (VENTOLIN HFA) 108 (90 Base) MCG/ACT inhaler, Inhale 2 puffs into the lungs every 6 (six) hours as needed for wheezing or shortness of breath., Disp: 8 g, Rfl: 1   allopurinol (ZYLOPRIM) 100 MG tablet, TAKE 1 TABLET EVERY DAY, Disp: 90 tablet, Rfl: 0   amoxicillin-clavulanate (AUGMENTIN) 875-125 MG tablet, Take 1 tablet by mouth 2 (two) times daily for 7 days., Disp: 14 tablet, Rfl: 0   benzonatate (TESSALON)  100 MG capsule, Take 1 capsule (100 mg total) by mouth 2 (two) times daily as needed for cough., Disp: 20 capsule, Rfl: 0   clopidogrel (PLAVIX) 75 MG tablet, TAKE 1 TABLET BY MOUTH EVERY DAY, Disp: 90 tablet, Rfl: 0   diphenhydramine-acetaminophen (TYLENOL PM) 25-500 MG TABS tablet, Take 1 tablet by mouth  at bedtime as needed., Disp: , Rfl:    ezetimibe-simvastatin (VYTORIN) 10-40 MG tablet, TAKE 1 TABLET EVERY DAY, Disp: 90 tablet, Rfl: 0   fexofenadine (ALLEGRA ALLERGY) 180 MG tablet, Take 1 tablet (180 mg total) by mouth daily., Disp: 10 tablet, Rfl: 1   finasteride (PROSCAR) 5 MG tablet, TAKE 1 TABLET EVERY DAY, Disp: 90 tablet, Rfl: 0   meloxicam (MOBIC) 15 MG tablet, Take 1 tablet (15 mg total) by mouth daily., Disp: 90 tablet, Rfl: 1   methocarbamol (ROBAXIN) 500 MG tablet, Take 500 mg by mouth 4 (four) times daily as needed., Disp: , Rfl: 1   metoprolol succinate (TOPROL-XL) 50 MG 24 hr tablet, TAKE 1 TABLET DAILY WITH OR IMMEDIATELY FOLLOWING A MEAL, Disp: 90 tablet, Rfl: 0   Multiple Vitamin (MULTIVITAMIN) tablet, Take 1 tablet by mouth daily., Disp: , Rfl:    lisinopril (ZESTRIL) 40 MG tablet, Take 1 tablet (40 mg total) by mouth daily., Disp: 30 tablet, Rfl: 3   RABEprazole (ACIPHEX) 20 MG tablet, TAKE 1 TABLET EVERY DAY (Patient not taking: Reported on 07/24/2021), Disp: 90 tablet, Rfl: 0     HTN :  Continue current meds.  Medication compliance emphasised. pt advised to keep Bp logs. Pt verbalised understanding of the same. Pt to have a low salt diet . Exercise to reach a goal of at least 150 mins a week.  lifestyle modifications explained and pt understands importance of the above.  Sinusitis : augmentin / tessalon/ allegra sent to pharmacy  pt advised to take Tylenol q 4- 6 hourly as needed. pt to take allegra q pm as needed and to call office if symptoms worsened pt verbalised understanding of such.     Problem List Items Addressed This Visit       Cardiovascular and  Mediastinum   Essential hypertension, benign   Relevant Medications   lisinopril (ZESTRIL) 40 MG tablet     Respiratory   Acute non-recurrent maxillary sinusitis - Primary   Relevant Medications   amoxicillin-clavulanate (AUGMENTIN) 875-125 MG tablet   benzonatate (TESSALON) 100 MG capsule   fexofenadine (ALLEGRA ALLERGY) 180 MG tablet     No orders of the defined types were placed in this encounter.    Meds ordered this encounter  Medications   amoxicillin-clavulanate (AUGMENTIN) 875-125 MG tablet    Sig: Take 1 tablet by mouth 2 (two) times daily for 7 days.    Dispense:  14 tablet    Refill:  0   albuterol (VENTOLIN HFA) 108 (90 Base) MCG/ACT inhaler    Sig: Inhale 2 puffs into the lungs every 6 (six) hours as needed for wheezing or shortness of breath.    Dispense:  8 g    Refill:  1   benzonatate (TESSALON) 100 MG capsule    Sig: Take 1 capsule (100 mg total) by mouth 2 (two) times daily as needed for cough.    Dispense:  20 capsule    Refill:  0   fexofenadine (ALLEGRA ALLERGY) 180 MG tablet    Sig: Take 1 tablet (180 mg total) by mouth daily.    Dispense:  10 tablet    Refill:  1   lisinopril (ZESTRIL) 40 MG tablet    Sig: Take 1 tablet (40 mg total) by mouth daily.    Dispense:  30 tablet    Refill:  3     Follow up plan: No follow-ups on file.

## 2021-07-27 ENCOUNTER — Telehealth: Payer: Self-pay | Admitting: Internal Medicine

## 2021-07-27 NOTE — Telephone Encounter (Signed)
Copied from [place] [id number]. Topic: Medicare AWV >> Jul 27, 2021  1:53 PM Lavonia Drafts wrote: Reason for CRM: Left message for patient to call back and schedule the Medicare Annual Wellness Visit (AWV) virtually or by telephone.  Last AWV 11/10/15  Please schedule at anytime with [facility]Nurse Health Advisor.  45 minute appointment  Any questions, please call me at [phone]

## 2021-08-04 ENCOUNTER — Other Ambulatory Visit: Payer: Self-pay | Admitting: Nurse Practitioner

## 2021-08-04 ENCOUNTER — Other Ambulatory Visit: Payer: Self-pay | Admitting: Internal Medicine

## 2021-08-04 DIAGNOSIS — E782 Mixed hyperlipidemia: Secondary | ICD-10-CM

## 2021-08-04 DIAGNOSIS — I10 Essential (primary) hypertension: Secondary | ICD-10-CM

## 2021-08-04 DIAGNOSIS — M1000 Idiopathic gout, unspecified site: Secondary | ICD-10-CM

## 2021-08-04 NOTE — Telephone Encounter (Signed)
Requested Prescriptions  Pending Prescriptions Disp Refills  . ezetimibe-simvastatin (VYTORIN) 10-40 MG tablet [Pharmacy Med Name: EZETIMIBE-SIMV 10-40 MG] 90 tablet 0    Sig: TAKE 1 TABLET EVERY DAY     Cardiovascular:  Antilipid - Sterol Transport Inhibitors Failed - 08/04/2021 10:26 AM      Failed - LDL in normal range and within 360 days    LDL Chol Calc (NIH)  Date Value Ref Range Status  05/22/2021 116 (H) 0 - 99 mg/dL Final         Failed - Triglycerides in normal range and within 360 days    Triglycerides  Date Value Ref Range Status  05/22/2021 211 (H) 0 - 149 mg/dL Final   Triglycerides Piccolo,Waived  Date Value Ref Range Status  05/29/2018 229 (H) <150 mg/dL Final    Comment:                            Normal                   <150                         Borderline High     150 - 199                         High                200 - 499                         Very High                >499          Passed - Total Cholesterol in normal range and within 360 days    Cholesterol, Total  Date Value Ref Range Status  05/22/2021 198 100 - 199 mg/dL Final   Cholesterol Piccolo, Waived  Date Value Ref Range Status  05/29/2018 160 <200 mg/dL Final    Comment:                            Desirable                <200                         Borderline High      200- 239                         High                     >239          Passed - HDL in normal range and within 360 days    HDL  Date Value Ref Range Status  05/22/2021 45 >39 mg/dL Final         Passed - Valid encounter within last 12 months    Recent Outpatient Visits          1 week ago Acute non-recurrent maxillary sinusitis   [facility] Vigg, Avanti, MD   2 months ago Gout, unspecified cause, unspecified chronicity, unspecified site   [facility] Vigg, Avanti, MD   9 months ago Essential hypertension, benign  [place], DO   1 year ago  Cellulitis of right hand   [facility] Eulogio Bear, NP   1 year ago Routine general medical examination at a health care facility   [facility], Barbaraann Faster, NP      Future Appointments            In 3 months Vigg, Avanti, MD [facility]           . allopurinol (ZYLOPRIM) 100 MG tablet [Pharmacy Med Name: ALLOPURINOL 100 MG TABLET] 90 tablet 0    Sig: TAKE 1 TABLET EVERY DAY     Endocrinology:  Gout Agents Passed - 08/04/2021 10:26 AM      Passed - Uric Acid in normal range and within 360 days    Uric Acid  Date Value Ref Range Status  05/22/2021 7.1 3.8 - 8.4 mg/dL Final    Comment:               Therapeutic target for gout patients: <6.0         Passed - Cr in normal range and within 360 days    Creatinine, Ser  Date Value Ref Range Status  05/22/2021 1.05 0.76 - 1.27 mg/dL Final         Passed - Valid encounter within last 12 months    Recent Outpatient Visits          1 week ago Acute non-recurrent maxillary sinusitis   [facility] Vigg, Avanti, MD   2 months ago Gout, unspecified cause, unspecified chronicity, unspecified site   [place], Avanti, MD   9 months ago Essential hypertension, benign   [place], [place], DO   1 year ago Cellulitis of right hand   [place], Jessica A, NP   1 year ago Routine general medical examination at a health care facility   [facility], Barbaraann Faster, NP      Future Appointments            In 3 months Vigg, Avanti, MD [facility], [facility]           . clopidogrel (PLAVIX) 75 MG tablet [Pharmacy Med Name: CLOPIDOGREL 75 MG TABLET] 90 tablet 0    Sig: TAKE 1 TABLET BY MOUTH EVERY DAY     Hematology: Antiplatelets - clopidogrel Failed - 08/04/2021 10:26 AM      Failed - Evaluate AST, ALT within 2 months of therapy initiation.      Passed - ALT in normal range  and within 360 days    ALT  Date Value Ref Range Status  05/22/2021 22 0 - 44 IU/L Final   ALT (SGPT) Piccolo, Waived  Date Value Ref Range Status  05/29/2018 31 10 - 47 U/L Final         Passed - AST in normal range and within 360 days    AST  Date Value Ref Range Status  05/22/2021 23 0 - 40 IU/L Final   AST (SGOT) Piccolo, Waived  Date Value Ref Range Status  05/29/2018 33 11 - 38 U/L Final         Passed - HCT in normal range and within 180 days    Hematocrit  Date Value Ref Range Status  05/22/2021 42.8 37.5 - 51.0 % Final         Passed - HGB in normal range and  within 180 days    Hemoglobin  Date Value Ref Range Status  05/22/2021 14.1 13.0 - 17.7 g/dL Final         Passed - PLT in normal range and within 180 days    Platelets  Date Value Ref Range Status  05/22/2021 262 150 - 450 x10E3/uL Final         Passed - Valid encounter within last 6 months    Recent Outpatient Visits          1 week ago Acute non-recurrent maxillary sinusitis   [facility] Vigg, Avanti, MD   2 months ago Gout, unspecified cause, unspecified chronicity, unspecified site   [facility] Vigg, Avanti, MD   9 months ago Essential hypertension, benign   [place], [place], DO   1 year ago Cellulitis of right hand   [place], Jessica A, NP   1 year ago Routine general medical examination at a health care facility   [facility], Barbaraann Faster, NP      Future Appointments            In 3 months Vigg, Avanti, MD [facility], [facility]

## 2021-08-04 NOTE — Telephone Encounter (Signed)
Requested Prescriptions  Pending Prescriptions Disp Refills  . metoprolol succinate (TOPROL-XL) 50 MG 24 hr tablet [Pharmacy Med Name: METOPROLOL SUCC ER 50 MG TAB] 90 tablet 0    Sig: TAKE 1 TABLET DAILY WITH OR IMMEDIATELY FOLLOWING A MEAL     Cardiovascular:  Beta Blockers Passed - 08/04/2021 10:26 AM      Passed - Last BP in normal range    BP Readings from Last 1 Encounters:  05/22/21 118/80         Passed - Last Heart Rate in normal range    Pulse Readings from Last 1 Encounters:  05/22/21 72         Passed - Valid encounter within last 6 months    Recent Outpatient Visits          1 week ago Acute non-recurrent maxillary sinusitis   [facility] Vigg, Avanti, MD   2 months ago Gout, unspecified cause, unspecified chronicity, unspecified site   [facility] Vigg, Avanti, MD   9 months ago Essential hypertension, benign   [place], [place], DO   1 year ago Cellulitis of right hand   [place], Jessica A, NP   1 year ago Routine general medical examination at a health care facility   [facility], Barbaraann Faster, NP      Future Appointments            In 3 months Vigg, Avanti, MD [facility], [facility]

## 2021-08-24 ENCOUNTER — Other Ambulatory Visit: Payer: Self-pay | Admitting: Internal Medicine

## 2021-08-24 NOTE — Telephone Encounter (Signed)
Requested Prescriptions  Pending Prescriptions Disp Refills   lisinopril (ZESTRIL) 20 MG tablet [Pharmacy Med Name: LISINOPRIL 20 MG TABLET] 90 tablet 0    Sig: TAKE 1 TABLET EVERY DAY     Cardiovascular:  ACE Inhibitors Passed - 08/24/2021 12:03 PM      Passed - Cr in normal range and within 180 days    Creatinine, Ser  Date Value Ref Range Status  05/22/2021 1.05 0.76 - 1.27 mg/dL Final         Passed - K in normal range and within 180 days    Potassium  Date Value Ref Range Status  05/22/2021 4.8 3.5 - 5.2 mmol/L Final         Passed - Patient is not pregnant      Passed - Last BP in normal range    BP Readings from Last 1 Encounters:  05/22/21 118/80         Passed - Valid encounter within last 6 months    Recent Outpatient Visits          1 month ago Acute non-recurrent maxillary sinusitis   [place] Vigg, Avanti, MD   3 months ago Gout, unspecified cause, unspecified chronicity, unspecified site   [place], Avanti, MD   10 months ago Essential hypertension, benign   [place], [place], DO   1 year ago Cellulitis of right hand   [place] Eulogio Bear, NP   1 year ago Routine general medical examination at a health care facility   [facility], Barbaraann Faster, NP      Future Appointments            In 2 weeks [place], Kathlene [place], MD [facility]   In 2 months Vigg, Avanti, MD [facility], [facility]

## 2021-09-13 ENCOUNTER — Ambulatory Visit: Payer: Medicare Other | Admitting: Cardiovascular Disease

## 2021-09-18 ENCOUNTER — Telehealth: Payer: Self-pay | Admitting: Internal Medicine

## 2021-09-18 NOTE — Telephone Encounter (Signed)
Copied from [place] [phone]. Topic: Medicare AWV >> Sep 18, 2021  3:17 PM Lavonia Drafts wrote: Reason for CRM:  Left message for patient to call back and schedule the Medicare Annual Wellness Visit (AWV) virtually or by telephone.  Last AWV 11/10/15  Please schedule at anytime with [facility]-Nurse Health Advisor.  45 minute appointment  Any questions, please call me at [phone]

## 2021-10-27 ENCOUNTER — Ambulatory Visit (INDEPENDENT_AMBULATORY_CARE_PROVIDER_SITE_OTHER): Payer: Medicare Other | Admitting: Internal Medicine

## 2021-10-27 ENCOUNTER — Other Ambulatory Visit: Payer: Self-pay

## 2021-10-27 VITALS — BP 137/78 | HR 66 | Temp 97.8°F | Ht 70.08 in | Wt 185.0 lb

## 2021-10-27 DIAGNOSIS — M62838 Other muscle spasm: Secondary | ICD-10-CM | POA: Diagnosis not present

## 2021-10-27 DIAGNOSIS — M545 Low back pain, unspecified: Secondary | ICD-10-CM

## 2021-10-27 MED ORDER — DICLOFENAC SODIUM 1 % EX GEL: 2.0000 g | Freq: Four times a day (QID) | CUTANEOUS | 1 refills | Status: DC

## 2021-10-27 MED ORDER — CYCLOBENZAPRINE HCL 5 MG PO TABS: 5.0000 mg | ORAL_TABLET | Freq: Every evening | ORAL | 0 refills | Status: AC

## 2021-10-27 NOTE — Progress Notes (Signed)
BP 137/78    Pulse 66    Temp 97.8 F (36.6 C) (Oral)    Ht 5' 10.08" (1.78 m)    Wt 185 lb (83.9 kg)    SpO2 94%    BMI 26.48 kg/m    Subjective:    Patient ID: Mark Quinn, male    DOB: 1945/10/19, 76 y.o.   MRN: [id number]  Chief Complaint  Patient presents with   Back Pain    Back pain since Tuesday    HPI: Mark Quinn is a 76 y.o. male  Back Pain This is a chronic (back pain per pt was moving stuff from his atic / boxes from [place] felt he twistd his back has pain and soreness in the left part of the lower back) problem. The current episode started in the past 7 days. The pain is present in the lumbar spine. The pain is at a severity of 8/10. Pertinent negatives include no abdominal pain, bladder incontinence, bowel incontinence, chest pain, dysuria, fever, headaches, numbness, paresis, paresthesias, pelvic pain, perianal numbness, tingling or weakness.   Chief Complaint  Patient presents with   Back Pain    Back pain since Tuesday    Relevant past medical, surgical, family and social history reviewed and updated as indicated. Interim medical history since our last visit reviewed. Allergies and medications reviewed and updated.  Review of Systems  Constitutional:  Negative for fever.  Cardiovascular:  Negative for chest pain.  Gastrointestinal:  Negative for abdominal pain and bowel incontinence.  Genitourinary:  Negative for bladder incontinence, dysuria and pelvic pain.  Musculoskeletal:  Positive for back pain.  Neurological:  Negative for tingling, weakness, numbness, headaches and paresthesias.   Per HPI unless specifically indicated above     Objective:    BP 137/78    Pulse 66    Temp 97.8 F (36.6 C) (Oral)    Ht 5' 10.08" (1.78 m)    Wt 185 lb (83.9 kg)    SpO2 94%    BMI 26.48 kg/m   Wt Readings from Last 3 Encounters:  10/27/21 185 lb (83.9 kg)  05/22/21 185 lb 12.8 oz (84.3 kg)  10/17/20 190 lb 12.8 oz (86.5 kg)    Physical Exam Vitals  and nursing note reviewed.  Constitutional:      General: He is not in acute distress.    Appearance: Normal appearance. He is not ill-appearing or diaphoretic.  HENT:     Head: Normocephalic and atraumatic.     Right Ear: Tympanic membrane and external ear normal. There is no impacted cerumen.     Left Ear: External ear normal.     Nose: No congestion or rhinorrhea.     Mouth/Throat:     Pharynx: No oropharyngeal exudate or posterior oropharyngeal erythema.  Eyes:     Conjunctiva/sclera: Conjunctivae normal.     Pupils: Pupils are equal, round, and reactive to light.  Cardiovascular:     Rate and Rhythm: Normal rate and regular rhythm.     Heart sounds: No murmur heard.   No friction rub. No gallop.  Pulmonary:     Effort: No respiratory distress.     Breath sounds: No stridor. No wheezing or rhonchi.  Chest:     Chest wall: No tenderness.  Musculoskeletal:        General: No swelling, tenderness or deformity.     Cervical back: Normal range of motion and neck supple. No rigidity or tenderness.  Left lower leg: No edema.  Skin:    General: Skin is warm and dry.  Neurological:     Mental Status: He is alert.     Cranial Nerves: No cranial nerve deficit.     Sensory: No sensory deficit.     Motor: No weakness.     Gait: Gait normal.  Psychiatric:        Mood and Affect: Mood normal.    Results for orders placed or performed in visit on 05/22/21  TSH  Result Value Ref Range   TSH 1.510 0.450 - 4.500 uIU/mL  PSA  Result Value Ref Range   Prostate Specific Ag, Serum 1.7 0.0 - 4.0 ng/mL  Lipid panel  Result Value Ref Range   Cholesterol, Total 198 100 - 199 mg/dL   Triglycerides 211 (H) 0 - 149 mg/dL   HDL 45 >39 mg/dL   VLDL Cholesterol Cal 37 5 - 40 mg/dL   LDL Chol Calc (NIH) 116 (H) 0 - 99 mg/dL   Chol/HDL Ratio 4.4 0.0 - 5.0 ratio  CBC with Differential/Platelet  Result Value Ref Range   WBC 7.6 3.4 - 10.8 x10E3/uL   RBC 4.49 4.14 - 5.80 x10E6/uL    Hemoglobin 14.1 13.0 - 17.7 g/dL   Hematocrit 42.8 37.5 - 51.0 %   MCV 95 79 - 97 fL   MCH 31.4 26.6 - 33.0 pg   MCHC 32.9 31.5 - 35.7 g/dL   RDW 12.7 11.6 - 15.4 %   Platelets 262 150 - 450 x10E3/uL   Neutrophils 59 Not Estab. %   Lymphs 30 Not Estab. %   Monocytes 7 Not Estab. %   Eos 4 Not Estab. %   Basos 0 Not Estab. %   Neutrophils Absolute 4.5 1.4 - 7.0 x10E3/uL   Lymphocytes Absolute 2.3 0.7 - 3.1 x10E3/uL   Monocytes Absolute 0.5 0.1 - 0.9 x10E3/uL   EOS (ABSOLUTE) 0.3 0.0 - 0.4 x10E3/uL   Basophils Absolute 0.0 0.0 - 0.2 x10E3/uL   Immature Granulocytes 0 Not Estab. %   Immature Grans (Abs) 0.0 0.0 - 0.1 x10E3/uL  Comprehensive metabolic panel  Result Value Ref Range   Glucose 90 70 - 99 mg/dL   BUN 18 8 - 27 mg/dL   Creatinine, Ser 1.05 0.76 - 1.27 mg/dL   eGFR 75 >59 mL/min/1.73   BUN/Creatinine Ratio 17 10 - 24   Sodium 142 134 - 144 mmol/L   Potassium 4.8 3.5 - 5.2 mmol/L   Chloride 102 96 - 106 mmol/L   CO2 19 (L) 20 - 29 mmol/L   Calcium 10.2 8.6 - 10.2 mg/dL   Total Protein 7.3 6.0 - 8.5 g/dL   Albumin 4.6 3.7 - 4.7 g/dL   Globulin, Total 2.7 1.5 - 4.5 g/dL   Albumin/Globulin Ratio 1.7 1.2 - 2.2   Bilirubin Total 0.5 0.0 - 1.2 mg/dL   Alkaline Phosphatase 60 44 - 121 IU/L   AST 23 0 - 40 IU/L   ALT 22 0 - 44 IU/L  Urinalysis, Routine w reflex microscopic  Result Value Ref Range   Specific Gravity, UA 1.025 1.005 - 1.030   pH, UA 5.5 5.0 - 7.5   Color, UA Yellow Yellow   Appearance Ur Clear Clear   Leukocytes,UA Negative Negative   Protein,UA Negative Negative/Trace   Glucose, UA Negative Negative   Ketones, UA Negative Negative   RBC, UA Negative Negative   Bilirubin, UA Negative Negative   Urobilinogen, Ur 0.2 0.2 -  1.0 mg/dL   Nitrite, UA Negative Negative  Uric acid  Result Value Ref Range   Uric Acid 7.1 3.8 - 8.4 mg/dL        Current Outpatient Medications:    albuterol (VENTOLIN HFA) 108 (90 Base) MCG/ACT inhaler, Inhale 2 puffs  into the lungs every 6 (six) hours as needed for wheezing or shortness of breath., Disp: 8 g, Rfl: 1   allopurinol (ZYLOPRIM) 100 MG tablet, TAKE 1 TABLET EVERY DAY, Disp: 90 tablet, Rfl: 0   clopidogrel (PLAVIX) 75 MG tablet, TAKE 1 TABLET BY MOUTH EVERY DAY, Disp: 90 tablet, Rfl: 0   cyclobenzaprine (FLEXERIL) 5 MG tablet, Take 1 tablet (5 mg total) by mouth every evening for 14 days., Disp: 14 tablet, Rfl: 0   diclofenac Sodium (VOLTAREN) 1 % GEL, Apply 2 g topically 4 (four) times daily., Disp: 2 g, Rfl: 1   diphenhydramine-acetaminophen (TYLENOL PM) 25-500 MG TABS tablet, Take 1 tablet by mouth at bedtime as needed., Disp: , Rfl:    ezetimibe-simvastatin (VYTORIN) 10-40 MG tablet, TAKE 1 TABLET EVERY DAY, Disp: 90 tablet, Rfl: 0   fexofenadine (ALLEGRA ALLERGY) 180 MG tablet, Take 1 tablet (180 mg total) by mouth daily., Disp: 10 tablet, Rfl: 1   finasteride (PROSCAR) 5 MG tablet, TAKE 1 TABLET EVERY DAY, Disp: 90 tablet, Rfl: 0   lisinopril (ZESTRIL) 40 MG tablet, Take 1 tablet (40 mg total) by mouth daily., Disp: 30 tablet, Rfl: 3   meloxicam (MOBIC) 15 MG tablet, Take 1 tablet (15 mg total) by mouth daily., Disp: 90 tablet, Rfl: 1   methocarbamol (ROBAXIN) 500 MG tablet, Take 500 mg by mouth 4 (four) times daily as needed., Disp: , Rfl: 1   metoprolol succinate (TOPROL-XL) 50 MG 24 hr tablet, TAKE 1 TABLET DAILY WITH OR IMMEDIATELY FOLLOWING A MEAL, Disp: 90 tablet, Rfl: 0   Multiple Vitamin (MULTIVITAMIN) tablet, Take 1 tablet by mouth daily., Disp: , Rfl:    RABEprazole (ACIPHEX) 20 MG tablet, TAKE 1 TABLET EVERY DAY (Patient not taking: Reported on 10/27/2021), Disp: 90 tablet, Rfl: 0    Assessment & Plan:  Back pain : Check xrays of such  Start pt on flexeril and voltaren gel today. Is on robaxin stop taking this as pt claims this is ineffective Check xrays of L spine   Problem List Items Addressed This Visit       Other   Muscle spasm - Primary   Other Visit  Diagnoses     Acute left-sided low back pain without sciatica       Relevant Medications   cyclobenzaprine (FLEXERIL) 5 MG tablet   Other Relevant Orders   DG Lumbar Spine Complete (Completed)        Orders Placed This Encounter  Procedures   DG Lumbar Spine Complete     Meds ordered this encounter  Medications   cyclobenzaprine (FLEXERIL) 5 MG tablet    Sig: Take 1 tablet (5 mg total) by mouth every evening for 14 days.    Dispense:  14 tablet    Refill:  0   diclofenac Sodium (VOLTAREN) 1 % GEL    Sig: Apply 2 g topically 4 (four) times daily.    Dispense:  2 g    Refill:  1     Follow up plan: No follow-ups on file.

## 2021-10-29 ENCOUNTER — Encounter: Payer: Self-pay | Admitting: Internal Medicine

## 2021-10-30 ENCOUNTER — Ambulatory Visit
Admission: RE | Admit: 2021-10-30 | Discharge: 2021-10-30 | Disposition: A | Discharge status: Home / Self Care | Payer: Medicare Other | Source: Ambulatory Visit | Attending: Internal Medicine | Admitting: Internal Medicine

## 2021-10-30 ENCOUNTER — Other Ambulatory Visit: Payer: Self-pay

## 2021-10-30 ENCOUNTER — Ambulatory Visit
Admission: RE | Admit: 2021-10-30 | Discharge: 2021-10-30 | Disposition: A | Discharge status: Home / Self Care | Payer: Medicare Other | Source: Home / Self Care | Attending: Internal Medicine | Admitting: Internal Medicine

## 2021-10-30 DIAGNOSIS — M545 Low back pain, unspecified: Secondary | ICD-10-CM | POA: Diagnosis not present

## 2021-10-30 DIAGNOSIS — M47816 Spondylosis without myelopathy or radiculopathy, lumbar region: Secondary | ICD-10-CM | POA: Diagnosis not present

## 2021-10-30 DIAGNOSIS — R109 Unspecified abdominal pain: Secondary | ICD-10-CM | POA: Diagnosis not present

## 2021-11-03 DIAGNOSIS — M545 Low back pain, unspecified: Secondary | ICD-10-CM | POA: Insufficient documentation

## 2021-11-10 ENCOUNTER — Telehealth: Payer: Self-pay | Admitting: Internal Medicine

## 2021-11-10 NOTE — Telephone Encounter (Signed)
Copied from [place]. Topic: Medicare AWV ?>> Nov 10, 2021  2:40 PM Lavonia Drafts wrote: ?Reason for CRM:  ?Left message for patient to call back and schedule the Medicare Annual Wellness Visit (AWV) virtually or by telephone. ? ?Last AWV 11/10/15 ? ?Please schedule at anytime with [facility]. ? ?45 minute appointment ? ?Any questions, please call me at [phone] ?

## 2021-11-20 ENCOUNTER — Other Ambulatory Visit: Payer: Self-pay | Admitting: Internal Medicine

## 2021-11-20 ENCOUNTER — Other Ambulatory Visit: Payer: Self-pay

## 2021-11-20 ENCOUNTER — Ambulatory Visit (INDEPENDENT_AMBULATORY_CARE_PROVIDER_SITE_OTHER): Payer: Medicare Other | Admitting: Internal Medicine

## 2021-11-20 ENCOUNTER — Encounter: Payer: Self-pay | Admitting: Internal Medicine

## 2021-11-20 ENCOUNTER — Other Ambulatory Visit: Payer: Self-pay | Admitting: Nurse Practitioner

## 2021-11-20 VITALS — BP 128/83 | HR 70 | Temp 98.1°F | Ht 70.08 in | Wt 195.4 lb

## 2021-11-20 DIAGNOSIS — M1000 Idiopathic gout, unspecified site: Secondary | ICD-10-CM

## 2021-11-20 DIAGNOSIS — E782 Mixed hyperlipidemia: Secondary | ICD-10-CM | POA: Diagnosis not present

## 2021-11-20 DIAGNOSIS — I10 Essential (primary) hypertension: Secondary | ICD-10-CM

## 2021-11-20 DIAGNOSIS — I2510 Atherosclerotic heart disease of native coronary artery without angina pectoris: Secondary | ICD-10-CM

## 2021-11-20 DIAGNOSIS — I2583 Coronary atherosclerosis due to lipid rich plaque: Secondary | ICD-10-CM

## 2021-11-20 MED ORDER — METOPROLOL SUCCINATE ER 50 MG PO TB24: ORAL_TABLET | ORAL | 1 refills | Status: DC

## 2021-11-20 MED ORDER — CLOPIDOGREL BISULFATE 75 MG PO TABS: 75.0000 mg | ORAL_TABLET | Freq: Every day | ORAL | 0 refills | Status: DC

## 2021-11-20 MED ORDER — ALLOPURINOL 100 MG PO TABS: 100.0000 mg | ORAL_TABLET | Freq: Every day | ORAL | 2 refills | Status: DC

## 2021-11-20 MED ORDER — METHOCARBAMOL 500 MG PO TABS: 500.0000 mg | ORAL_TABLET | Freq: Every evening | ORAL | 1 refills | Status: AC | PRN

## 2021-11-20 MED ORDER — EZETIMIBE-SIMVASTATIN 10-40 MG PO TABS: 1.0000 | ORAL_TABLET | Freq: Every day | ORAL | 0 refills | Status: DC

## 2021-11-20 MED ORDER — LISINOPRIL 20 MG PO TABS: 20.0000 mg | ORAL_TABLET | Freq: Every day | ORAL | 3 refills | Status: DC

## 2021-11-20 NOTE — Progress Notes (Signed)
? ?BP 128/83   Pulse 70   Temp 98.1 ?F (36.7 ?C) (Oral)   Ht 5' 10.08" (1.78 m)   Wt 195 lb 6.4 oz (88.6 kg)   SpO2 94%   BMI 27.97 kg/m?   ? ?Subjective:  ? ? Patient ID: Mark Quinn, male    DOB: 10/15/1945, 75 y.o.   MRN: [id number] ? ?Chief Complaint  ?Patient presents with  ?? Medication Refill  ?? Back Pain  ?  Patient states that his back pain is much better today  ? ? ?HPI: ?Mark Quinn is a 75 y.o. male ? 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 ? ?Back Pain ?This is a chronic problem. The current episode started more than 1 year ago. The pain is at a severity of 4/10. Pertinent negatives include no abdominal pain, bladder incontinence, bowel incontinence, chest pain, dysuria, fever, headaches, leg pain, numbness, paresis, paresthesias, pelvic pain, perianal numbness, tingling, weakness or weight loss.  ? ?Chief Complaint  ?Patient presents with  ?? Medication Refill  ?? Back Pain  ?  Patient states that his back pain is much better today  ? ? ?Relevant past medical, surgical, family and social history reviewed and updated as indicated. Interim medical history since our last visit reviewed. ?Allergies and medications reviewed and updated. ? ?Review of Systems  ?Constitutional:  Negative for fever and weight loss.  ?Cardiovascular:  Negative for chest pain.  ?Gastrointestinal:  Negative for abdominal pain and bowel incontinence.  ?Genitourinary:  Negative for bladder incontinence, dysuria and pelvic pain.  ?Musculoskeletal:  Positive for back pain.  ?Neurological:  Negative for tingling, weakness, numbness, headaches and paresthesias.  ? ?Per HPI unless specifically indicated above ? ?   ?Objective:  ?  ?BP 128/83   Pulse 70   Temp 98.1 ?F (36.7 ?C) (Oral)   Ht 5' 10.08" (1.78 m)   Wt 195 lb 6.4 oz (88.6 kg)   SpO2 94%   BMI 27.97 kg/m?   ?Wt Readings from Last 3 Encounters:  ?11/20/21 195 lb 6.4 oz (88.6 kg)  ?10/27/21 185 lb (83.9 kg)  ?05/22/21 185 lb 12.8 oz (84.3 kg)  ?   ?Physical Exam ?Vitals and nursing note reviewed.  ?Constitutional:   ?   General: He is not in acute distress. ?   Appearance: Normal appearance. He is not ill-appearing or diaphoretic.  ?HENT:  ?   Head: Normocephalic and atraumatic.  ?   Right Ear: Tympanic membrane and external ear normal. There is no impacted cerumen.  ?   Left Ear: External ear normal.  ?   Nose: No congestion or rhinorrhea.  ?   Mouth/Throat:  ?   Pharynx: No oropharyngeal exudate or posterior oropharyngeal erythema.  ?Eyes:  ?   Conjunctiva/sclera: Conjunctivae normal.  ?   Pupils: Pupils are equal, round, and reactive to light.  ?Cardiovascular:  ?   Rate and Rhythm: Normal rate and regular rhythm.  ?   Heart sounds: No murmur heard. ?  No friction rub. No gallop.  ?Pulmonary:  ?   Effort: No respiratory distress.  ?   Breath sounds: No stridor. No wheezing or rhonchi.  ?Abdominal:  ?   General: Abdomen is flat. Bowel sounds are normal.  ?   Palpations: Abdomen is soft. There is no mass.  ?   Tenderness: There is no abdominal tenderness.  ?Musculoskeletal:     ?   General: No swelling, tenderness, deformity or signs of injury.  ?     Cervical back: Normal range of motion and neck supple. No rigidity or tenderness.  ?   Right lower leg: No edema.  ?   Left lower leg: No edema.  ?Skin: ?   General: Skin is warm and dry.  ?Neurological:  ?   General: No focal deficit present.  ?   Mental Status: He is alert and oriented to person, place, and time.  ?   Cranial Nerves: No cranial nerve deficit.  ?   Sensory: No sensory deficit.  ?   Motor: No weakness.  ?   Coordination: Coordination normal.  ?Psychiatric:     ?   Mood and Affect: Mood normal.     ?   Behavior: Behavior normal.     ?   Thought Content: Thought content normal.     ?   Judgment: Judgment normal.  ? ? ?Results for orders placed or performed in visit on 05/22/21  ?TSH  ?Result Value Ref Range  ? TSH 1.510 0.450 - 4.500 uIU/mL  ?PSA  ?Result Value Ref Range  ? Prostate Specific Ag,  Serum 1.7 0.0 - 4.0 ng/mL  ?Lipid panel  ?Result Value Ref Range  ? Cholesterol, Total 198 100 - 199 mg/dL  ? Triglycerides 211 (H) 0 - 149 mg/dL  ? HDL 45 >39 mg/dL  ? VLDL Cholesterol Cal 37 5 - 40 mg/dL  ? LDL Chol Calc (NIH) 116 (H) 0 - 99 mg/dL  ? Chol/HDL Ratio 4.4 0.0 - 5.0 ratio  ?CBC with Differential/Platelet  ?Result Value Ref Range  ? WBC 7.6 3.4 - 10.8 x10E3/uL  ? RBC 4.49 4.14 - 5.80 x10E6/uL  ? Hemoglobin 14.1 13.0 - 17.7 g/dL  ? Hematocrit 42.8 37.5 - 51.0 %  ? MCV 95 79 - 97 fL  ? MCH 31.4 26.6 - 33.0 pg  ? MCHC 32.9 31.5 - 35.7 g/dL  ? RDW 12.7 11.6 - 15.4 %  ? Platelets 262 150 - 450 x10E3/uL  ? Neutrophils 59 Not Estab. %  ? Lymphs 30 Not Estab. %  ? Monocytes 7 Not Estab. %  ? Eos 4 Not Estab. %  ? Basos 0 Not Estab. %  ? Neutrophils Absolute 4.5 1.4 - 7.0 x10E3/uL  ? Lymphocytes Absolute 2.3 0.7 - 3.1 x10E3/uL  ? Monocytes Absolute 0.5 0.1 - 0.9 x10E3/uL  ? EOS (ABSOLUTE) 0.3 0.0 - 0.4 x10E3/uL  ? Basophils Absolute 0.0 0.0 - 0.2 x10E3/uL  ? Immature Granulocytes 0 Not Estab. %  ? Immature Grans (Abs) 0.0 0.0 - 0.1 x10E3/uL  ?Comprehensive metabolic panel  ?Result Value Ref Range  ? Glucose 90 70 - 99 mg/dL  ? BUN 18 8 - 27 mg/dL  ? Creatinine, Ser 1.05 0.76 - 1.27 mg/dL  ? eGFR 75 >59 mL/min/1.73  ? BUN/Creatinine Ratio 17 10 - 24  ? Sodium 142 134 - 144 mmol/L  ? Potassium 4.8 3.5 - 5.2 mmol/L  ? Chloride 102 96 - 106 mmol/L  ? CO2 19 (L) 20 - 29 mmol/L  ? Calcium 10.2 8.6 - 10.2 mg/dL  ? Total Protein 7.3 6.0 - 8.5 g/dL  ? Albumin 4.6 3.7 - 4.7 g/dL  ? Globulin, Total 2.7 1.5 - 4.5 g/dL  ? Albumin/Globulin Ratio 1.7 1.2 - 2.2  ? Bilirubin Total 0.5 0.0 - 1.2 mg/dL  ? Alkaline Phosphatase 60 44 - 121 IU/L  ? AST 23 0 - 40 IU/L  ? ALT 22 0 - 44 IU/L  ?Urinalysis, Routine w reflex microscopic  ?  Result Value Ref Range  ? Specific Gravity, UA 1.025 1.005 - 1.030  ? pH, UA 5.5 5.0 - 7.5  ? Color, UA Yellow Yellow  ? Appearance Ur Clear Clear  ? Leukocytes,UA Negative Negative  ? Protein,UA  Negative Negative/Trace  ? Glucose, UA Negative Negative  ? Ketones, UA Negative Negative  ? RBC, UA Negative Negative  ? Bilirubin, UA Negative Negative  ? Urobilinogen, Ur 0.2 0.2 - 1.0 mg/dL  ? Nitrite, UA Negative Negative  ?Uric acid  ?Result Value Ref Range  ? Uric Acid 7.1 3.8 - 8.4 mg/dL  ? ?   ? ? ?Current Outpatient Medications:  ??  albuterol (VENTOLIN HFA) 108 (90 Base) MCG/ACT inhaler, Inhale 2 puffs into the lungs every 6 (six) hours as needed for wheezing or shortness of breath., Disp: 8 g, Rfl: 1 ??  diphenhydramine-acetaminophen (TYLENOL PM) 25-500 MG TABS tablet, Take 1 tablet by mouth at bedtime as needed., Disp: , Rfl:  ??  Multiple Vitamin (MULTIVITAMIN) tablet, Take 1 tablet by mouth daily., Disp: , Rfl:  ??  allopurinol (ZYLOPRIM) 100 MG tablet, Take 1 tablet (100 mg total) by mouth daily., Disp: 90 tablet, Rfl: 2 ??  clopidogrel (PLAVIX) 75 MG tablet, Take 1 tablet (75 mg total) by mouth daily., Disp: 90 tablet, Rfl: 0 ??  diclofenac Sodium (VOLTAREN) 1 % GEL, Apply 2 g topically 4 (four) times daily., Disp: 2 g, Rfl: 1 ??  ezetimibe-simvastatin (VYTORIN) 10-40 MG tablet, Take 1 tablet by mouth daily., Disp: 90 tablet, Rfl: 0 ??  lisinopril (ZESTRIL) 20 MG tablet, Take 1 tablet (20 mg total) by mouth daily., Disp: 30 tablet, Rfl: 3 ??  methocarbamol (ROBAXIN) 500 MG tablet, Take 1 tablet (500 mg total) by mouth at bedtime as needed for up to 10 days., Disp: 15 tablet, Rfl: 1 ??  metoprolol succinate (TOPROL-XL) 50 MG 24 hr tablet, TAKE 1 TABLET DAILY WITH OR IMMEDIATELY FOLLOWING A MEAL, Disp: 90 tablet, Rfl: 1  ? ? ?Assessment & Plan:  ?CAD :  ?Sees Dr. Rockey Situ Cad, stent x 2 ?Plavix for suhc ? Unsure if still needs such was placed 12 yrs ago, unsure which kind of stents. ? ?HLD ?Is on vytrorin ?recheck FLP, check LFT's work on diet, SE of meds explained to pt. low fat and high fiber diet explained to pt. ? ?Back pain : stable ?Methocarbamol for spasms.  ? ? ?HTN : recent fracture is seen.  Lumbar spondylosis. There is minimal ?retrolisthesis at L3-L4 level. ?Continue current meds.  Medication compliance emphasised. pt advised to keep Bp logs. Pt verbalised understanding of the same. Pt to have a low salt di

## 2021-11-23 ENCOUNTER — Ambulatory Visit: Payer: Self-pay

## 2021-11-23 NOTE — Telephone Encounter (Signed)
?  Chief Complaint: rash ?Symptoms: rash on back and has spread to chest on R side of body  ?Frequency: 4-5 days ?Pertinent Negatives: NA ?Disposition: '[]'$ ED /'[]'$ Urgent Care (no appt availability in office) / '[x]'$ Appointment(In office/virtual)/ '[]'$  [facility]/ '[]'$ Home Care/ '[]'$ Refused Recommended Disposition /'[]'$ [facility]/ '[]'$  Follow-up with PCP ?Additional Notes: Pt's wife called in stating that pt had rash start on R back and scratched it with wooden back scratcher and now has rash on R chest and itching so bad it hurts and unable to sleep because can't lay on back. She has used hydrocortisone cream and not helping. Advised her to give him benadryl and scheduled an appt 11/24/21 @ 0820.  ? ?Reason for Disposition ? [1] MODERATE-SEVERE local itching (i.e., interferes with work, school, activities) AND [2] not improved after 24 hours of hydrocortisone cream ? ?Answer Assessment - Initial Assessment Questions ?1. DESCRIPTION: "Describe the itching you are having." "Where is it located?" ?    Welps on back and itching  ?2. SEVERITY: "How bad is it?"  ?  - MILD - doesn't interfere with normal activities ?  - MODERATE-SEVERE: interferes with work, school, sleep, or other activities  ?    Moderate  ?3. SCRATCHING: "Are there any scratch marks? Bleeding?" ?    Yes has scratched to make worse ?4. ONSET: "When did the itching begin?"  ?    Sunday  ?6. OTHER SYMPTOMS: "Do you have any other symptoms?"  ?    Painful ? ?Protocols used: Itching - Localized-A-AH ? ?

## 2021-11-24 ENCOUNTER — Encounter: Payer: Self-pay | Admitting: Internal Medicine

## 2021-11-24 ENCOUNTER — Ambulatory Visit (INDEPENDENT_AMBULATORY_CARE_PROVIDER_SITE_OTHER): Payer: Medicare Other | Admitting: Internal Medicine

## 2021-11-24 VITALS — BP 136/88 | HR 75 | Temp 98.4°F | Ht 70.08 in | Wt 197.4 lb

## 2021-11-24 DIAGNOSIS — R21 Rash and other nonspecific skin eruption: Secondary | ICD-10-CM | POA: Diagnosis not present

## 2021-11-24 MED ORDER — PREGABALIN 50 MG PO CAPS: 50.0000 mg | ORAL_CAPSULE | Freq: Every evening | ORAL | 0 refills | Status: DC

## 2021-11-24 MED ORDER — CEPHALEXIN 500 MG PO CAPS: 500.0000 mg | ORAL_CAPSULE | Freq: Two times a day (BID) | ORAL | 0 refills | Status: AC

## 2021-11-24 MED ORDER — VALACYCLOVIR HCL 1 G PO TABS: 1000.0000 mg | ORAL_TABLET | Freq: Three times a day (TID) | ORAL | 0 refills | Status: AC

## 2021-11-24 NOTE — Progress Notes (Signed)
? ?BP 136/88   Pulse 75   Temp 98.4 ?F (36.9 ?C) (Oral)   Ht 5' 10.08" (1.78 m)   Wt 197 lb 6.4 oz (89.5 kg)   SpO2 98%   BMI 28.26 kg/m?   ? ?Subjective:  ? ? Patient ID: Mark Quinn, male    DOB: June 14, 1946, 76 y.o.   MRN: [id number] ? ?Chief Complaint  ?Patient presents with  ?? Rash  ?  Spot on Right chest and spot on Right Back started on Sunday  ? ? ?HPI: ?Mark Quinn is a 76 y.o. male ? ?Rash ?Chronicity: on the left side of the upper back used a back scratcher and sratched it. The current episode started 1 to 4 weeks ago (co ithcing in the front of the chest wall on the right side). The problem has been gradually worsening since onset. The affected locations include the chest and back. The rash is characterized by redness, pain and itchiness. Pertinent negatives include no anorexia, congestion, cough, diarrhea, eye pain, facial edema, fatigue, fever, joint pain, nail changes, rhinorrhea, shortness of breath, sore throat or vomiting.  ? ?Chief Complaint  ?Patient presents with  ?? Rash  ?  Spot on Right chest and spot on Right Back started on Sunday  ? ? ?Relevant past medical, surgical, family and social history reviewed and updated as indicated. Interim medical history since our last visit reviewed. ?Allergies and medications reviewed and updated. ? ?Review of Systems  ?Constitutional:  Negative for fatigue and fever.  ?HENT:  Negative for congestion, rhinorrhea and sore throat.   ?Eyes:  Negative for pain.  ?Respiratory:  Negative for cough and shortness of breath.   ?Gastrointestinal:  Negative for anorexia, diarrhea and vomiting.  ?Musculoskeletal:  Negative for joint pain.  ?Skin:  Positive for rash. Negative for nail changes.  ? ?Per HPI unless specifically indicated above ? ?   ?Objective:  ?  ?BP 136/88   Pulse 75   Temp 98.4 ?F (36.9 ?C) (Oral)   Ht 5' 10.08" (1.78 m)   Wt 197 lb 6.4 oz (89.5 kg)   SpO2 98%   BMI 28.26 kg/m?   ?Wt Readings from Last 3 Encounters:  ?11/24/21 197 lb  6.4 oz (89.5 kg)  ?11/20/21 195 lb 6.4 oz (88.6 kg)  ?10/27/21 185 lb (83.9 kg)  ?  ?Physical Exam ?Vitals and nursing note reviewed.  ?Constitutional:   ?   General: He is not in acute distress. ?   Appearance: Normal appearance. He is not ill-appearing or diaphoretic.  ?HENT:  ?   Head: Normocephalic and atraumatic.  ?   Right Ear: Tympanic membrane and external ear normal. There is no impacted cerumen.  ?   Left Ear: External ear normal.  ?   Nose: No congestion or rhinorrhea.  ?   Mouth/Throat:  ?   Pharynx: No oropharyngeal exudate or posterior oropharyngeal erythema.  ?Eyes:  ?   Conjunctiva/sclera: Conjunctivae normal.  ?   Pupils: Pupils are equal, round, and reactive to light.  ?Cardiovascular:  ?   Rate and Rhythm: Normal rate and regular rhythm.  ?   Heart sounds: No murmur heard. ?  No friction rub. No gallop.  ?Pulmonary:  ?   Effort: No respiratory distress.  ?   Breath sounds: No stridor. No wheezing or rhonchi.  ?Chest:  ?   Chest wall: No tenderness.  ?Abdominal:  ?   General: Abdomen is flat. Bowel sounds are normal.  ?  Palpations: Abdomen is soft. There is no mass.  ?   Tenderness: There is no abdominal tenderness.  ?Musculoskeletal:  ?   Cervical back: Normal range of motion and neck supple. No rigidity or tenderness.  ?Skin: ?   General: Skin is warm and dry.  ?   Coloration: Skin is not pale.  ?   Findings: Rash present. No lesion.  ?Neurological:  ?   Mental Status: He is alert.  ? ?Results for orders placed or performed in visit on 05/22/21  ?TSH  ?Result Value Ref Range  ? TSH 1.510 0.450 - 4.500 uIU/mL  ?PSA  ?Result Value Ref Range  ? Prostate Specific Ag, Serum 1.7 0.0 - 4.0 ng/mL  ?Lipid panel  ?Result Value Ref Range  ? Cholesterol, Total 198 100 - 199 mg/dL  ? Triglycerides 211 (H) 0 - 149 mg/dL  ? HDL 45 >39 mg/dL  ? VLDL Cholesterol Cal 37 5 - 40 mg/dL  ? LDL Chol Calc (NIH) 116 (H) 0 - 99 mg/dL  ? Chol/HDL Ratio 4.4 0.0 - 5.0 ratio  ?CBC with Differential/Platelet  ?Result Value  Ref Range  ? WBC 7.6 3.4 - 10.8 x10E3/uL  ? RBC 4.49 4.14 - 5.80 x10E6/uL  ? Hemoglobin 14.1 13.0 - 17.7 g/dL  ? Hematocrit 42.8 37.5 - 51.0 %  ? MCV 95 79 - 97 fL  ? MCH 31.4 26.6 - 33.0 pg  ? MCHC 32.9 31.5 - 35.7 g/dL  ? RDW 12.7 11.6 - 15.4 %  ? Platelets 262 150 - 450 x10E3/uL  ? Neutrophils 59 Not Estab. %  ? Lymphs 30 Not Estab. %  ? Monocytes 7 Not Estab. %  ? Eos 4 Not Estab. %  ? Basos 0 Not Estab. %  ? Neutrophils Absolute 4.5 1.4 - 7.0 x10E3/uL  ? Lymphocytes Absolute 2.3 0.7 - 3.1 x10E3/uL  ? Monocytes Absolute 0.5 0.1 - 0.9 x10E3/uL  ? EOS (ABSOLUTE) 0.3 0.0 - 0.4 x10E3/uL  ? Basophils Absolute 0.0 0.0 - 0.2 x10E3/uL  ? Immature Granulocytes 0 Not Estab. %  ? Immature Grans (Abs) 0.0 0.0 - 0.1 x10E3/uL  ?Comprehensive metabolic panel  ?Result Value Ref Range  ? Glucose 90 70 - 99 mg/dL  ? BUN 18 8 - 27 mg/dL  ? Creatinine, Ser 1.05 0.76 - 1.27 mg/dL  ? eGFR 75 >59 mL/min/1.73  ? BUN/Creatinine Ratio 17 10 - 24  ? Sodium 142 134 - 144 mmol/L  ? Potassium 4.8 3.5 - 5.2 mmol/L  ? Chloride 102 96 - 106 mmol/L  ? CO2 19 (L) 20 - 29 mmol/L  ? Calcium 10.2 8.6 - 10.2 mg/dL  ? Total Protein 7.3 6.0 - 8.5 g/dL  ? Albumin 4.6 3.7 - 4.7 g/dL  ? Globulin, Total 2.7 1.5 - 4.5 g/dL  ? Albumin/Globulin Ratio 1.7 1.2 - 2.2  ? Bilirubin Total 0.5 0.0 - 1.2 mg/dL  ? Alkaline Phosphatase 60 44 - 121 IU/L  ? AST 23 0 - 40 IU/L  ? ALT 22 0 - 44 IU/L  ?Urinalysis, Routine w reflex microscopic  ?Result Value Ref Range  ? Specific Gravity, UA 1.025 1.005 - 1.030  ? pH, UA 5.5 5.0 - 7.5  ? Color, UA Yellow Yellow  ? Appearance Ur Clear Clear  ? Leukocytes,UA Negative Negative  ? Protein,UA Negative Negative/Trace  ? Glucose, UA Negative Negative  ? Ketones, UA Negative Negative  ? RBC, UA Negative Negative  ? Bilirubin, UA Negative Negative  ? Urobilinogen, Ur  0.2 0.2 - 1.0 mg/dL  ? Nitrite, UA Negative Negative  ?Uric acid  ?Result Value Ref Range  ? Uric Acid 7.1 3.8 - 8.4 mg/dL  ? ?   ? ? ?Current Outpatient Medications:   ??  albuterol (VENTOLIN HFA) 108 (90 Base) MCG/ACT inhaler, Inhale 2 puffs into the lungs every 6 (six) hours as needed for wheezing or shortness of breath., Disp: 8 g, Rfl: 1 ??  allopurinol (ZYLOPRIM) 100 MG tablet, Take 1 tablet (100 mg total) by mouth daily., Disp: 90 tablet, Rfl: 2 ??  clopidogrel (PLAVIX) 75 MG tablet, Take 1 tablet (75 mg total) by mouth daily., Disp: 90 tablet, Rfl: 0 ??  diclofenac Sodium (VOLTAREN) 1 % GEL, Apply 2 g topically 4 (four) times daily., Disp: 2 g, Rfl: 1 ??  diphenhydramine-acetaminophen (TYLENOL PM) 25-500 MG TABS tablet, Take 1 tablet by mouth at bedtime as needed., Disp: , Rfl:  ??  ezetimibe-simvastatin (VYTORIN) 10-40 MG tablet, Take 1 tablet by mouth daily., Disp: 90 tablet, Rfl: 0 ??  lisinopril (ZESTRIL) 20 MG tablet, Take 1 tablet (20 mg total) by mouth daily., Disp: 30 tablet, Rfl: 3 ??  methocarbamol (ROBAXIN) 500 MG tablet, Take 1 tablet (500 mg total) by mouth at bedtime as needed for up to 10 days., Disp: 15 tablet, Rfl: 1 ??  metoprolol succinate (TOPROL-XL) 50 MG 24 hr tablet, TAKE 1 TABLET DAILY WITH OR IMMEDIATELY FOLLOWING A MEAL, Disp: 90 tablet, Rfl: 1 ??  Multiple Vitamin (MULTIVITAMIN) tablet, Take 1 tablet by mouth daily., Disp: , Rfl:   ? ? ?Assessment & Plan:  ?Rash / tenderness noted on the chest wall area and the back in a dermatomal distribution no vesicles noted/ no blistering or crusting noted on the skin.  ?Will need to start pt on lyrica for pain  ?Will start valtrex for presumed shingles given dermatomal distribution and rash on ipsilateral areas of the same dermatome.  ?Will need? Medrol dose pak. ?Cover with keflex ? ?Problem List Items Addressed This Visit   ?None ?  ? ?No orders of the defined types were placed in this encounter. ?  ? ?No orders of the defined types were placed in this encounter. ?  ? ?Follow up plan: ?No follow-ups on file. ? ? ? ?

## 2021-11-28 ENCOUNTER — Ambulatory Visit (INDEPENDENT_AMBULATORY_CARE_PROVIDER_SITE_OTHER): Payer: Medicare Other | Admitting: Internal Medicine

## 2021-11-28 ENCOUNTER — Encounter: Payer: Self-pay | Admitting: Internal Medicine

## 2021-11-28 VITALS — BP 117/80 | HR 76 | Temp 97.7°F | Ht 70.08 in | Wt 197.0 lb

## 2021-11-28 DIAGNOSIS — R21 Rash and other nonspecific skin eruption: Secondary | ICD-10-CM | POA: Diagnosis not present

## 2021-11-28 LAB — BAYER DCA HB A1C WAIVED: HB A1C (BAYER DCA - WAIVED): 5.4 % (ref 4.8–5.6)

## 2021-11-28 NOTE — Progress Notes (Signed)
? ?BP 117/80   Pulse 76   Temp 97.7 ?F (36.5 ?C) (Oral)   Ht 5' 10.08" (1.78 m)   Wt 197 lb (89.4 kg)   SpO2 97%   BMI 28.20 kg/m?   ? ?Subjective:  ? ? Patient ID: Mark Quinn, male    DOB: 12-Jul-1946, 76 y.o.   MRN: [id number] ? ?Chief Complaint  ?Patient presents with  ? Rash  ?  F/U for rash, did not start the Valtrex until yesterday. Patient does say that rash is getting better.  ? ? ?HPI: ?Mark Quinn is a 76 y.o. male ? ?Rash ?This is a new problem.  ? ?Chief Complaint  ?Patient presents with  ? Rash  ?  F/U for rash, did not start the Valtrex until yesterday. Patient does say that rash is getting better.  ? ? ?Relevant past medical, surgical, family and social history reviewed and updated as indicated. Interim medical history since our last visit reviewed. ?Allergies and medications reviewed and updated. ? ?Review of Systems  ?Skin:  Positive for rash.  ? ?Per HPI unless specifically indicated above ? ?   ?Objective:  ?  ?BP 117/80   Pulse 76   Temp 97.7 ?F (36.5 ?C) (Oral)   Ht 5' 10.08" (1.78 m)   Wt 197 lb (89.4 kg)   SpO2 97%   BMI 28.20 kg/m?   ?Wt Readings from Last 3 Encounters:  ?11/28/21 197 lb (89.4 kg)  ?11/24/21 197 lb 6.4 oz (89.5 kg)  ?11/20/21 195 lb 6.4 oz (88.6 kg)  ?  ?Physical Exam ? ?Results for orders placed or performed in visit on 05/22/21  ?TSH  ?Result Value Ref Range  ? TSH 1.510 0.450 - 4.500 uIU/mL  ?PSA  ?Result Value Ref Range  ? Prostate Specific Ag, Serum 1.7 0.0 - 4.0 ng/mL  ?Lipid panel  ?Result Value Ref Range  ? Cholesterol, Total 198 100 - 199 mg/dL  ? Triglycerides 211 (H) 0 - 149 mg/dL  ? HDL 45 >39 mg/dL  ? VLDL Cholesterol Cal 37 5 - 40 mg/dL  ? LDL Chol Calc (NIH) 116 (H) 0 - 99 mg/dL  ? Chol/HDL Ratio 4.4 0.0 - 5.0 ratio  ?CBC with Differential/Platelet  ?Result Value Ref Range  ? WBC 7.6 3.4 - 10.8 x10E3/uL  ? RBC 4.49 4.14 - 5.80 x10E6/uL  ? Hemoglobin 14.1 13.0 - 17.7 g/dL  ? Hematocrit 42.8 37.5 - 51.0 %  ? MCV 95 79 - 97 fL  ? MCH 31.4 26.6 -  33.0 pg  ? MCHC 32.9 31.5 - 35.7 g/dL  ? RDW 12.7 11.6 - 15.4 %  ? Platelets 262 150 - 450 x10E3/uL  ? Neutrophils 59 Not Estab. %  ? Lymphs 30 Not Estab. %  ? Monocytes 7 Not Estab. %  ? Eos 4 Not Estab. %  ? Basos 0 Not Estab. %  ? Neutrophils Absolute 4.5 1.4 - 7.0 x10E3/uL  ? Lymphocytes Absolute 2.3 0.7 - 3.1 x10E3/uL  ? Monocytes Absolute 0.5 0.1 - 0.9 x10E3/uL  ? EOS (ABSOLUTE) 0.3 0.0 - 0.4 x10E3/uL  ? Basophils Absolute 0.0 0.0 - 0.2 x10E3/uL  ? Immature Granulocytes 0 Not Estab. %  ? Immature Grans (Abs) 0.0 0.0 - 0.1 x10E3/uL  ?Comprehensive metabolic panel  ?Result Value Ref Range  ? Glucose 90 70 - 99 mg/dL  ? BUN 18 8 - 27 mg/dL  ? Creatinine, Ser 1.05 0.76 - 1.27 mg/dL  ? eGFR 75 >59 mL/min/1.73  ?  BUN/Creatinine Ratio 17 10 - 24  ? Sodium 142 134 - 144 mmol/L  ? Potassium 4.8 3.5 - 5.2 mmol/L  ? Chloride 102 96 - 106 mmol/L  ? CO2 19 (L) 20 - 29 mmol/L  ? Calcium 10.2 8.6 - 10.2 mg/dL  ? Total Protein 7.3 6.0 - 8.5 g/dL  ? Albumin 4.6 3.7 - 4.7 g/dL  ? Globulin, Total 2.7 1.5 - 4.5 g/dL  ? Albumin/Globulin Ratio 1.7 1.2 - 2.2  ? Bilirubin Total 0.5 0.0 - 1.2 mg/dL  ? Alkaline Phosphatase 60 44 - 121 IU/L  ? AST 23 0 - 40 IU/L  ? ALT 22 0 - 44 IU/L  ?Urinalysis, Routine w reflex microscopic  ?Result Value Ref Range  ? Specific Gravity, UA 1.025 1.005 - 1.030  ? pH, UA 5.5 5.0 - 7.5  ? Color, UA Yellow Yellow  ? Appearance Ur Clear Clear  ? Leukocytes,UA Negative Negative  ? Protein,UA Negative Negative/Trace  ? Glucose, UA Negative Negative  ? Ketones, UA Negative Negative  ? RBC, UA Negative Negative  ? Bilirubin, UA Negative Negative  ? Urobilinogen, Ur 0.2 0.2 - 1.0 mg/dL  ? Nitrite, UA Negative Negative  ?Uric acid  ?Result Value Ref Range  ? Uric Acid 7.1 3.8 - 8.4 mg/dL  ? ?   ? ? ?Current Outpatient Medications:  ?  albuterol (VENTOLIN HFA) 108 (90 Base) MCG/ACT inhaler, Inhale 2 puffs into the lungs every 6 (six) hours as needed for wheezing or shortness of breath., Disp: 8 g, Rfl: 1 ?   allopurinol (ZYLOPRIM) 100 MG tablet, Take 1 tablet (100 mg total) by mouth daily., Disp: 90 tablet, Rfl: 2 ?  cephALEXin (KEFLEX) 500 MG capsule, Take 1 capsule (500 mg total) by mouth 2 (two) times daily for 7 days., Disp: 14 capsule, Rfl: 0 ?  clopidogrel (PLAVIX) 75 MG tablet, Take 1 tablet (75 mg total) by mouth daily., Disp: 90 tablet, Rfl: 0 ?  diphenhydramine-acetaminophen (TYLENOL PM) 25-500 MG TABS tablet, Take 1 tablet by mouth at bedtime as needed., Disp: , Rfl:  ?  ezetimibe-simvastatin (VYTORIN) 10-40 MG tablet, Take 1 tablet by mouth daily., Disp: 90 tablet, Rfl: 0 ?  lisinopril (ZESTRIL) 20 MG tablet, Take 1 tablet (20 mg total) by mouth daily., Disp: 30 tablet, Rfl: 3 ?  methocarbamol (ROBAXIN) 500 MG tablet, Take 1 tablet (500 mg total) by mouth at bedtime as needed for up to 10 days., Disp: 15 tablet, Rfl: 1 ?  metoprolol succinate (TOPROL-XL) 50 MG 24 hr tablet, TAKE 1 TABLET DAILY WITH OR IMMEDIATELY FOLLOWING A MEAL, Disp: 90 tablet, Rfl: 1 ?  Multiple Vitamin (MULTIVITAMIN) tablet, Take 1 tablet by mouth daily., Disp: , Rfl:  ?  pregabalin (LYRICA) 50 MG capsule, Take 1 capsule (50 mg total) by mouth every evening., Disp: 30 capsule, Rfl: 0 ?  valACYclovir (VALTREX) 1000 MG tablet, Take 1 tablet (1,000 mg total) by mouth 3 (three) times daily for 7 days., Disp: 21 tablet, Rfl: 0 ?  diclofenac Sodium (VOLTAREN) 1 % GEL, Apply 2 g topically 4 (four) times daily. (Patient not taking: Reported on 11/28/2021), Disp: 2 g, Rfl: 1  ? ? ?Assessment & Plan:  ?Rash ? Shingles is on lyrica and keflex as well as valtrex now around the chest wall on the left lateral part of such  ?Continue  valtrex 1000 mg tid x 7 days ?Check an a1c - wnl at 5.4  ? ?Problem List Items Addressed This Visit   ?  None ?  ? ?No orders of the defined types were placed in this encounter. ?  ? ?No orders of the defined types were placed in this encounter. ?  ? ?Follow up plan: ?No follow-ups on file. ? ?

## 2021-11-29 LAB — COMPREHENSIVE METABOLIC PANEL
ALT: 27 IU/L (ref 0–44)
AST: 26 IU/L (ref 0–40)
Albumin/Globulin Ratio: 2 (ref 1.2–2.2)
Albumin: 4.5 g/dL (ref 3.7–4.7)
Alkaline Phosphatase: 54 IU/L (ref 44–121)
BUN/Creatinine Ratio: 14 (ref 10–24)
BUN: 14 mg/dL (ref 8–27)
Bilirubin Total: 0.2 mg/dL (ref 0.0–1.2)
CO2: 22 mmol/L (ref 20–29)
Calcium: 9.6 mg/dL (ref 8.6–10.2)
Chloride: 107 mmol/L — ABNORMAL HIGH (ref 96–106)
Creatinine, Ser: 1.03 mg/dL (ref 0.76–1.27)
Globulin, Total: 2.2 g/dL (ref 1.5–4.5)
Glucose: 132 mg/dL — ABNORMAL HIGH (ref 70–99)
Potassium: 4.2 mmol/L (ref 3.5–5.2)
Sodium: 142 mmol/L (ref 134–144)
Total Protein: 6.7 g/dL (ref 6.0–8.5)
eGFR: 77 mL/min/{1.73_m2} (ref >59)

## 2021-12-01 ENCOUNTER — Other Ambulatory Visit: Payer: Self-pay | Admitting: Nurse Practitioner

## 2021-12-04 NOTE — Telephone Encounter (Signed)
Refill request for Proscar 5 MG; last refill 03/2020. Pt next appt 05/23/22, please advise. Last OV was in 11/28/21. ?

## 2021-12-04 NOTE — Telephone Encounter (Signed)
Will need to wait until provider returns. Not on medication list.  ?

## 2021-12-05 NOTE — Telephone Encounter (Signed)
Routing to providers in basket for her return.  ?

## 2021-12-12 ENCOUNTER — Other Ambulatory Visit: Payer: Self-pay | Admitting: Internal Medicine

## 2021-12-12 NOTE — Telephone Encounter (Signed)
Requested Prescriptions  ?Pending Prescriptions Disp Refills  ?? diclofenac Sodium (VOLTAREN) 1 % GEL [Pharmacy Med Name: DICLOFENAC SODIUM 1% GEL] 2 g 0  ?  Sig: Apply 2 g topically 4 (four) times daily.  ?  ? Analgesics:  Topicals Failed - 12/12/2021 12:45 PM  ?  ?  Failed - Manual Review: Labs are only required if the patient has taken medication for more than 8 weeks.  ?  ?  Passed - PLT in normal range and within 360 days  ?  Platelets  ?Date Value Ref Range Status  ?05/22/2021 262 150 - 450 x10E3/uL Final  ?   ?  ?  Passed - HGB in normal range and within 360 days  ?  Hemoglobin  ?Date Value Ref Range Status  ?05/22/2021 14.1 13.0 - 17.7 g/dL Final  ?   ?  ?  Passed - HCT in normal range and within 360 days  ?  Hematocrit  ?Date Value Ref Range Status  ?05/22/2021 42.8 37.5 - 51.0 % Final  ?   ?  ?  Passed - Cr in normal range and within 360 days  ?  Creatinine, Ser  ?Date Value Ref Range Status  ?11/28/2021 1.03 0.76 - 1.27 mg/dL Final  ?   ?  ?  Passed - eGFR is 30 or above and within 360 days  ?  GFR calc Af Amer  ?Date Value Ref Range Status  ?10/17/2020 100 >59 mL/min/1.73 Final  ?  Comment:  ?  **In accordance with recommendations from the NKF-ASN Task force,** ?  [facility] is in the process of updating its eGFR calculation to the ?  2021 CKD-EPI creatinine equation that estimates kidney function ?  without a race variable. ?  ? ?GFR calc non Af Amer  ?Date Value Ref Range Status  ?10/17/2020 87 >59 mL/min/1.73 Final  ? ?eGFR  ?Date Value Ref Range Status  ?11/28/2021 77 >59 mL/min/1.73 Final  ?   ?  ?  Passed - Patient is not pregnant  ?  ?  Passed - Valid encounter within last 12 months  ?  Recent Outpatient Visits   ?      ? 2 weeks ago Rash and nonspecific skin eruption  ? [facility] Vigg, Avanti, MD  ? 2 weeks ago Rash and nonspecific skin eruption  ? [facility] Vigg, Avanti, MD  ? 3 weeks ago Coronary artery disease due to lipid rich plaque  ? [facility] Vigg,  Avanti, MD  ? 1 month ago Muscle spasm  ? [facility] Vigg, Avanti, MD  ? 4 months ago Acute non-recurrent maxillary sinusitis  ? [facility] Vigg, Avanti, MD  ?  ?  ?Future Appointments   ?        ? In 2 weeks Gollan, Kathlene November, MD [facility], [id number]  ? In 5 months Vigg, Avanti, MD [facility], [facility]  ?  ? ?  ?  ?  ? ?

## 2021-12-21 ENCOUNTER — Other Ambulatory Visit: Payer: Self-pay | Admitting: Internal Medicine

## 2021-12-22 NOTE — Telephone Encounter (Signed)
Requested medication (s) are due for refill today: yes ? ?Requested medication (s) are on the active medication list: yes ? ?Last refill:  11/24/21 #30  prescription ends 12/24/21 ? ?Future visit scheduled: yes ? ?Notes to clinic:  med not delegated to NT to RF ? ? ?Requested Prescriptions  ?Pending Prescriptions Disp Refills  ? pregabalin (LYRICA) 50 MG capsule [Pharmacy Med Name: PREGABALIN 50 MG CAPSULE] 30 capsule 0  ?  Sig: Take 1 capsule (50 mg total) by mouth every evening.  ?  ? Not Delegated - Neurology:  Anticonvulsants - Controlled - pregabalin Failed - 12/21/2021  3:02 PM  ?  ?  Failed - This refill cannot be delegated  ?  ?  Passed - Cr in normal range and within 360 days  ?  Creatinine, Ser  ?Date Value Ref Range Status  ?11/28/2021 1.03 0.76 - 1.27 mg/dL Final  ?  ?  ?  ?  Passed - Completed PHQ-2 or PHQ-9 in the last 360 days  ?  ?  Passed - Valid encounter within last 12 months  ?  Recent Outpatient Visits   ? ?      ? 3 weeks ago Rash and nonspecific skin eruption  ? [facility] Vigg, Avanti, MD  ? 4 weeks ago Rash and nonspecific skin eruption  ? [facility] Vigg, Avanti, MD  ? 1 month ago Coronary artery disease due to lipid rich plaque  ? [facility] Vigg, Avanti, MD  ? 1 month ago Muscle spasm  ? [facility] Vigg, Avanti, MD  ? 5 months ago Acute non-recurrent maxillary sinusitis  ? [facility] Vigg, Avanti, MD  ? ?  ?  ?Future Appointments   ? ?        ? In 4 days Gollan, Kathlene November, MD [facility], [facility]  ? In 5 months Vigg, Avanti, MD [facility], [facility]  ? ?  ? ? ?  ?  ?  ? ? ? ? ?

## 2021-12-25 NOTE — Progress Notes (Signed)
Cardiology Office Note ? ?Date:  12/26/2021  ? ?ID:  Mark Quinn, DOB 1945-10-28, MRN [id number] ? ?PCP:  Charlynne Cousins, MD  ? ?Chief Complaint  ?Patient presents with  ? 12 month follow up   ?  "Doing well." Medications reviewed by the patient verbally.   ? ? ?HPI:  ?Mark Quinn is a 76 yo gentleman with past medical history of ?Hypertension ?Hyperlipidemia ?Cad, stent x 2,   2006, Elective PCI to RCA and ramus. ?Presents for follow-up of his coronary artery disease ? ?Last seen in clinic by myself March 2021 ?Retired at work, ?Pulled back to work ?Stopping mid May ? ?BP stable, ?On lisinopril 20 mg daily ?Numbers well controlled at home ? ? ? ?Feels well, denies any significant chest pain on exertion ?Rare SOB, with heavy exertion ?No regular exercise program ? ?Works third shift, up all night ?BP well controlled at home ?Mildly elevated on today's visit ? ?Echo 02/2018 ?Left ventricle: The cavity size was normal. Systolic function was  ?  normal. The estimated ejection fraction was in the range of 60%  ?  to 65%. Wall motion was normal; there were no regional wall  ?  motion abnormalities.  ?- Aortic valve: There was very mild stenosis.  ?- Murmur likely from aortic valve sclerosis without significant  ?  stenosis.  ? ?Lab work reviewed ?Total cholesterol 162 LDL 84 in February 2021 ? ?EKG personally reviewed by myself on todays visit ?Shows normal sinus rhythm with rate 77 bpm first-degree AV block no significant ST-T wave abnormality ?Previous angina sx: ?Nausea, sick feeling ?Stress test, positive ?11/2004 cath ?Elective PCI to RCA and ramus. JR4 guide. pRCA predilated with a 2.5x15 Sprinter and a 3.5x23 and 3.5x13 Cypher was deployed at 33 Atm and post dilated with a 3.75x20 Hysail at 16 atm. Next the ramus was directly  stented with a 4.0x12 Driver to [place]. Post PCI shots reveal good  results and we used the StarClose device for closure. ? ? ?PMH:   has a past medical history of CAD (coronary artery  disease), Cellulitis, Gout, Melanoma ([place]) (06/2013), and Obesity. ? ?PSH:    ?Past Surgical History:  ?Procedure Laterality Date  ? CARDIAC CATHETERIZATION  2006  ? [place]  ? CORONARY STENT PLACEMENT  2006  ?  CYPHER drug eluting Stent RX 3.50 X 16m/CYPHER drug eluting Stent RX 3.50 X 282m ? ? ?Current Outpatient Medications  ?Medication Sig Dispense Refill  ? albuterol (VENTOLIN HFA) 108 (90 Base) MCG/ACT inhaler Inhale 2 puffs into the lungs every 6 (six) hours as needed for wheezing or shortness of breath. 8 g 1  ? allopurinol (ZYLOPRIM) 100 MG tablet Take 1 tablet (100 mg total) by mouth daily. 90 tablet 2  ? clopidogrel (PLAVIX) 75 MG tablet Take 1 tablet (75 mg total) by mouth daily. 90 tablet 0  ? diclofenac Sodium (VOLTAREN) 1 % GEL Apply 2 g topically 4 (four) times daily. 2 g 0  ? diphenhydramine-acetaminophen (TYLENOL PM) 25-500 MG TABS tablet Take 1 tablet by mouth at bedtime as needed.    ? ezetimibe-simvastatin (VYTORIN) 10-40 MG tablet Take 1 tablet by mouth daily. 90 tablet 0  ? lisinopril (ZESTRIL) 20 MG tablet Take 1 tablet (20 mg total) by mouth daily. 30 tablet 3  ? metoprolol succinate (TOPROL-XL) 50 MG 24 hr tablet TAKE 1 TABLET DAILY WITH OR IMMEDIATELY FOLLOWING A MEAL 90 tablet 1  ? Multiple Vitamin (MULTIVITAMIN) tablet Take 1 tablet by  mouth daily.    ? pregabalin (LYRICA) 50 MG capsule Take 1 capsule (50 mg total) by mouth every evening. 30 capsule 5  ? ?No current facility-administered medications for this visit.  ? ? ?Allergies:   Other  ? ?Social History:  The patient  reports that he quit smoking about 46 years ago. His smoking use included cigarettes. He has a 8.00 pack-year smoking history. He has never used smokeless tobacco. He reports current alcohol use. He reports that he does not use drugs.  ? ?Family History:   family history includes Diabetes in his son; Heart disease in his mother; Hyperlipidemia in his mother.  ? ? ?Review of Systems: ?Review of Systems  ?Constitutional:  Negative.   ?HENT: Negative.    ?Respiratory: Negative.    ?Cardiovascular: Negative.   ?Gastrointestinal: Negative.   ?Musculoskeletal: Negative.   ?Neurological: Negative.   ?Psychiatric/Behavioral: Negative.    ?All other systems reviewed and are negative. ? ?PHYSICAL EXAM: ?VS:  BP 128/72 (BP Location: Left Arm, Patient Position: Sitting, Cuff Size: Normal)   Pulse 75   Ht '5\' 10"'  (1.778 m)   Wt 196 lb (88.9 kg)   SpO2 98%   BMI 28.12 kg/m?  , BMI Body mass index is 28.12 kg/m?Marland Kitchen ?Constitutional:  oriented to person, place, and time. No distress.  ?HENT:  ?Head: Grossly normal ?Eyes:  no discharge. No scleral icterus.  ?Neck: No JVD, no carotid bruits  ?Cardiovascular: Regular rate and rhythm, no murmurs appreciated ?Pulmonary/Chest: Clear to auscultation bilaterally, no wheezes or rails ?Abdominal: Soft.  no distension.  no tenderness.  ?Musculoskeletal: Normal range of motion ?Neurological:  normal muscle tone. Coordination normal. No atrophy ?Skin: Skin warm and dry ?Psychiatric: normal affect, pleasant ? ?Recent Labs: ?05/22/2021: Hemoglobin 14.1; Platelets 262; TSH 1.510 ?11/28/2021: ALT 27; BUN 14; Creatinine, Ser 1.03; Potassium 4.2; Sodium 142  ? ? ?Lipid Panel ?Lab Results  ?Component Value Date  ? CHOL 198 05/22/2021  ? HDL 45 05/22/2021  ? LDLCALC 116 (H) 05/22/2021  ? TRIG 211 (H) 05/22/2021  ? ?  ? ?Wt Readings from Last 3 Encounters:  ?12/26/21 196 lb (88.9 kg)  ?11/28/21 197 lb (89.4 kg)  ?11/24/21 197 lb 6.4 oz (89.5 kg)  ?  ? ? ? ?ASSESSMENT AND PLAN: ? ?Coronary artery disease due to lipid rich plaque ?Prior stent to proximal RCA and ramus in 2004 ?Stay on plavix, not on aspirin ?Change to crestor ? ?Essential hypertension, benign ?Blood pressure is well controlled on today's visit. No changes made to the medications. ? ?Aortic valve disease ?Prior echocardiogram with aortic valve sclerosis, ?Low-grade murmur on today's visit, no change ? ?Mixed hyperlipidemia ?change to Crestor 40 daily with  Zetia 10 daily ?Lipids not at goal ?We did discuss PCSK9 inhibitor ? ? Total encounter time more than 30 minutes ? Greater than 50% was spent in counseling and coordination of care with the patient ? ? ?Orders Placed This Encounter  ?Procedures  ? EKG 12-Lead  ? ? ? ?Signed, ?Esmond Plants, M.D., Ph.D. ?12/26/2021  ?[place] ?[id number] ? ?

## 2021-12-26 ENCOUNTER — Ambulatory Visit: Payer: Medicare Other | Admitting: Cardiovascular Disease

## 2021-12-26 ENCOUNTER — Encounter: Payer: Self-pay | Admitting: Cardiovascular Disease

## 2021-12-26 VITALS — BP 128/72 | HR 75 | Ht 70.0 in | Wt 196.0 lb

## 2021-12-26 DIAGNOSIS — I358 Other nonrheumatic aortic valve disorders: Secondary | ICD-10-CM

## 2021-12-26 DIAGNOSIS — I25118 Atherosclerotic heart disease of native coronary artery with other forms of angina pectoris: Secondary | ICD-10-CM

## 2021-12-26 DIAGNOSIS — E782 Mixed hyperlipidemia: Secondary | ICD-10-CM

## 2021-12-26 DIAGNOSIS — I10 Essential (primary) hypertension: Secondary | ICD-10-CM

## 2021-12-26 MED ORDER — EZETIMIBE 10 MG PO TABS: 10.0000 mg | ORAL_TABLET | Freq: Every day | ORAL | 3 refills | Status: DC

## 2021-12-26 MED ORDER — ROSUVASTATIN CALCIUM 40 MG PO TABS: 40.0000 mg | ORAL_TABLET | Freq: Every day | ORAL | 3 refills | Status: DC

## 2021-12-26 NOTE — Patient Instructions (Addendum)
Medication Instructions:  ?Stop the ezetimibe-simvastatin ?Start crestor 40 mg daily and zetia 10 mg daily ? ?If you need a refill on your cardiac medications before your next appointment, please call your pharmacy.  ? ?Lab work: ?Your physician recommends that you return for a FASTING lipid profile and LFTs: In 3 months ? ?- You will need to be fasting. Please do not have anything to eat or drink after midnight the morning you have the lab work. You may only have water or black coffee with no cream or sugar.  ? ?You will need to go to the [facility] to have this completed.  ? ?Medical Mall Entrance at [facility] ?1st desk on the right to check in, past the screening table ?Lab hours: Monday- Friday (7:30 am- 5:30 pm)  ? ?Testing/Procedures: ?No new testing needed ? ?Follow-Up: ?At [facility], you and your health needs are our priority.  As part of our continuing mission to provide you with exceptional heart care, we have created designated Provider Care Teams.  These Care Teams include your primary Cardiologist (physician) and Advanced Practice Providers (APPs -  Physician Assistants and Nurse Practitioners) who all work together to provide you with the care you need, when you need it. ? ?You will need a follow up appointment in 12 months ? ?Providers on your designated Care Team:   ?Murray Hodgkins, NP ?Christell Faith, PA-C ?Cadence Kathlen Mody, PA-C ? ?COVID-19 Vaccine Information can be found at: [email or web address] For questions related to vaccine distribution or appointments, please email [email or web address] or call [phone].  ? ?

## 2022-01-19 ENCOUNTER — Other Ambulatory Visit: Payer: Self-pay | Admitting: Family Medicine

## 2022-01-19 ENCOUNTER — Other Ambulatory Visit: Payer: Self-pay | Admitting: Internal Medicine

## 2022-01-23 ENCOUNTER — Other Ambulatory Visit: Payer: Self-pay

## 2022-01-23 MED ORDER — DICLOFENAC SODIUM 1 % EX GEL: 2.0000 g | Freq: Four times a day (QID) | CUTANEOUS | 0 refills | Status: DC

## 2022-01-23 NOTE — Telephone Encounter (Signed)
Requested medication (s) are due for refill today - no  Requested medication (s) are on the active medication list -yes  Future visit scheduled -yes  Last refill: 12/22/21 #30 5RF  Notes to clinic: non delegated Rx  Requested Prescriptions  Pending Prescriptions Disp Refills   pregabalin (LYRICA) 50 MG capsule [Pharmacy Med Name: PREGABALIN 50 MG CAPSULE] 30 capsule 0    Sig: Take 1 capsule (50 mg total) by mouth every evening.     Not Delegated - Neurology:  Anticonvulsants - Controlled - pregabalin Failed - 01/19/2022  1:34 PM      Failed - This refill cannot be delegated      Passed - Cr in normal range and within 360 days    Creatinine, Ser  Date Value Ref Range Status  11/28/2021 1.03 0.76 - 1.27 mg/dL Final         Passed - Completed PHQ-2 or PHQ-9 in the last 360 days      Passed - Valid encounter within last 12 months    Recent Outpatient Visits           1 month ago Rash and nonspecific skin eruption   [facility] Vigg, Avanti, MD   2 months ago Rash and nonspecific skin eruption   [facility] Vigg, Avanti, MD   2 months ago Coronary artery disease due to lipid rich plaque   [facility] Vigg, Avanti, MD   2 months ago Muscle spasm   [facility] Vigg, Avanti, MD   6 months ago Acute non-recurrent maxillary sinusitis   [place] Vigg, Avanti, MD       Future Appointments             In 4 months Vigg, Avanti, MD [place], [facility]                Requested Prescriptions  Pending Prescriptions Disp Refills   pregabalin (LYRICA) 50 MG capsule [Pharmacy Med Name: PREGABALIN 50 MG CAPSULE] 30 capsule 0    Sig: Take 1 capsule (50 mg total) by mouth every evening.     Not Delegated - Neurology:  Anticonvulsants - Controlled - pregabalin Failed - 01/19/2022  1:34 PM      Failed - This refill cannot be delegated      Passed - Cr in normal range and within 360 days    Creatinine,  Ser  Date Value Ref Range Status  11/28/2021 1.03 0.76 - 1.27 mg/dL Final         Passed - Completed PHQ-2 or PHQ-9 in the last 360 days      Passed - Valid encounter within last 12 months    Recent Outpatient Visits           1 month ago Rash and nonspecific skin eruption   [facility] Vigg, Avanti, MD   2 months ago Rash and nonspecific skin eruption   [facility] Vigg, Avanti, MD   2 months ago Coronary artery disease due to lipid rich plaque   [facility] Vigg, Avanti, MD   2 months ago Muscle spasm   [facility] Vigg, Avanti, MD   6 months ago Acute non-recurrent maxillary sinusitis   [facility] Vigg, Avanti, MD       Future Appointments             In 4 months Vigg, Avanti, MD [facility], [facility]

## 2022-01-23 NOTE — Telephone Encounter (Signed)
rx was sent to pharmacy today by provider E-Prescribing Status: Receipt confirmed by pharmacy (01/23/2022 9:25 AM EDT)

## 2022-02-15 ENCOUNTER — Other Ambulatory Visit: Payer: Self-pay | Admitting: Internal Medicine

## 2022-02-15 DIAGNOSIS — E782 Mixed hyperlipidemia: Secondary | ICD-10-CM

## 2022-02-15 NOTE — Telephone Encounter (Signed)
Requested Prescriptions  Pending Prescriptions Disp Refills  . clopidogrel (PLAVIX) 75 MG tablet [Pharmacy Med Name: CLOPIDOGREL 75 MG TABLET] 90 tablet 0    Sig: Take 1 tablet (75 mg total) by mouth daily.     Hematology: Antiplatelets - clopidogrel Failed - 02/15/2022 12:27 PM      Failed - HCT in normal range and within 180 days    Hematocrit  Date Value Ref Range Status  05/22/2021 42.8 37.5 - 51.0 % Final         Failed - HGB in normal range and within 180 days    Hemoglobin  Date Value Ref Range Status  05/22/2021 14.1 13.0 - 17.7 g/dL Final         Failed - PLT in normal range and within 180 days    Platelets  Date Value Ref Range Status  05/22/2021 262 150 - 450 x10E3/uL Final         Passed - Cr in normal range and within 360 days    Creatinine, Ser  Date Value Ref Range Status  11/28/2021 1.03 0.76 - 1.27 mg/dL Final         Passed - Valid encounter within last 6 months    Recent Outpatient Visits          2 months ago Rash and nonspecific skin eruption   [facility] Vigg, Avanti, MD   2 months ago Rash and nonspecific skin eruption   [facility] Vigg, Avanti, MD   2 months ago Coronary artery disease due to lipid rich plaque   [facility] Vigg, Avanti, MD   3 months ago Muscle spasm   [facility] Vigg, Avanti, MD   6 months ago Acute non-recurrent maxillary sinusitis   [facility] Vigg, Avanti, MD      Future Appointments            In 3 months Vigg, Avanti, MD [facility], [facility]

## 2022-02-15 NOTE — Telephone Encounter (Signed)
rx was dc'd on 12/26/21 by Cardiology. Requested Prescriptions  Pending Prescriptions Disp Refills  . ezetimibe-simvastatin (VYTORIN) 10-40 MG tablet [Pharmacy Med Name: EZETIMIBE-SIMV 10-40 MG] 90 tablet 0    Sig: Take 1 tablet by mouth daily.     Cardiovascular:  Antilipid - Sterol Transport Inhibitors Failed - 02/15/2022 12:18 PM      Failed - Lipid Panel in normal range within the last 12 months    Cholesterol, Total  Date Value Ref Range Status  05/22/2021 198 100 - 199 mg/dL Final   Cholesterol Piccolo, Waived  Date Value Ref Range Status  05/29/2018 160 <200 mg/dL Final    Comment:                            Desirable                <200                         Borderline High      200- 239                         High                     >239    LDL Chol Calc (NIH)  Date Value Ref Range Status  05/22/2021 116 (H) 0 - 99 mg/dL Final   HDL  Date Value Ref Range Status  05/22/2021 45 >39 mg/dL Final   Triglycerides  Date Value Ref Range Status  05/22/2021 211 (H) 0 - 149 mg/dL Final   Triglycerides Piccolo,Waived  Date Value Ref Range Status  05/29/2018 229 (H) <150 mg/dL Final    Comment:                            Normal                   <150                         Borderline High     150 - 199                         High                200 - 499                         Very High                >499          Passed - AST in normal range and within 360 days    AST  Date Value Ref Range Status  11/28/2021 26 0 - 40 IU/L Final   AST (SGOT) Piccolo, Waived  Date Value Ref Range Status  05/29/2018 33 11 - 38 U/L Final         Passed - ALT in normal range and within 360 days    ALT  Date Value Ref Range Status  11/28/2021 27 0 - 44 IU/L Final   ALT (SGPT) Piccolo, Waived  Date Value Ref Range Status  05/29/2018 31 10 - 47 U/L Final         Passed -  Patient is not pregnant      Passed - Valid encounter within last 12 months    Recent Outpatient  Visits          2 months ago Rash and nonspecific skin eruption   [facility] Vigg, Avanti, MD   2 months ago Rash and nonspecific skin eruption   [facility] Vigg, Avanti, MD   2 months ago Coronary artery disease due to lipid rich plaque   [facility] Vigg, Avanti, MD   3 months ago Muscle spasm   [facility] Vigg, Avanti, MD   6 months ago Acute non-recurrent maxillary sinusitis   [facility] Vigg, Avanti, MD      Future Appointments            In 3 months Vigg, Avanti, MD [facility], [facility]

## 2022-03-19 ENCOUNTER — Other Ambulatory Visit: Payer: Self-pay | Admitting: Internal Medicine

## 2022-03-20 NOTE — Telephone Encounter (Signed)
Requested medication (s) are due for refill today: yes  Requested medication (s) are on the active medication list: no  Last refill:  09/05/21  Future visit scheduled: yes  Notes to clinic:  not on med list and provider who was pt PCP no longer at practice, please advise      Requested Prescriptions  Pending Prescriptions Disp Refills   finasteride (PROSCAR) 5 MG tablet [Pharmacy Med Name: FINASTERIDE 5 MG TABLET] 90 tablet 0    Sig: TAKE 1 TABLET EVERY DAY     Urology: 5-alpha Reductase Inhibitors Passed - 03/20/2022  5:37 PM      Passed - PSA in normal range and within 360 days    Prostate Specific Ag, Serum  Date Value Ref Range Status  05/22/2021 1.7 0.0 - 4.0 ng/mL Final    Comment:    Roche ECLIA methodology. According to the American Urological Association, Serum PSA should decrease and remain at undetectable levels after radical prostatectomy. The AUA defines biochemical recurrence as an initial PSA value 0.2 ng/mL or greater followed by a subsequent confirmatory PSA value 0.2 ng/mL or greater. Values obtained with different assay methods or kits cannot be used interchangeably. Results cannot be interpreted as absolute evidence of the presence or absence of malignant disease.          Passed - Valid encounter within last 12 months    Recent Outpatient Visits           3 months ago Rash and nonspecific skin eruption   [facility] Vigg, Avanti, MD   3 months ago Rash and nonspecific skin eruption   [facility] Vigg, Avanti, MD   4 months ago Coronary artery disease due to lipid rich plaque   [facility] Vigg, Avanti, MD   4 months ago Muscle spasm   [facility] Vigg, Avanti, MD   7 months ago Acute non-recurrent maxillary sinusitis   [facility] Vigg, Avanti, MD       Future Appointments             In 2 months Vigg, Avanti, MD [facility], [facility]

## 2022-03-21 NOTE — Telephone Encounter (Signed)
Called and LVM asking for patient to please return my call. Need to find out if patient takes this medication as it is not on medication list.

## 2022-03-22 NOTE — Telephone Encounter (Signed)
Called and LVM asking for patient to please return my call.  

## 2022-05-23 ENCOUNTER — Encounter: Payer: Medicare Other | Admitting: Physician Assistant

## 2022-05-31 ENCOUNTER — Ambulatory Visit (INDEPENDENT_AMBULATORY_CARE_PROVIDER_SITE_OTHER): Payer: Medicare Other | Admitting: Physician Assistant

## 2022-05-31 ENCOUNTER — Encounter: Payer: Self-pay | Admitting: Physician Assistant

## 2022-05-31 VITALS — BP 129/78 | HR 63 | Temp 98.0°F | Ht 70.0 in | Wt 195.2 lb

## 2022-05-31 DIAGNOSIS — N400 Enlarged prostate without lower urinary tract symptoms: Secondary | ICD-10-CM

## 2022-05-31 DIAGNOSIS — Z131 Encounter for screening for diabetes mellitus: Secondary | ICD-10-CM

## 2022-05-31 DIAGNOSIS — I10 Essential (primary) hypertension: Secondary | ICD-10-CM

## 2022-05-31 DIAGNOSIS — I2510 Atherosclerotic heart disease of native coronary artery without angina pectoris: Secondary | ICD-10-CM | POA: Diagnosis not present

## 2022-05-31 DIAGNOSIS — Z1329 Encounter for screening for other suspected endocrine disorder: Secondary | ICD-10-CM

## 2022-05-31 DIAGNOSIS — Z23 Encounter for immunization: Secondary | ICD-10-CM | POA: Diagnosis not present

## 2022-05-31 DIAGNOSIS — E782 Mixed hyperlipidemia: Secondary | ICD-10-CM | POA: Diagnosis not present

## 2022-05-31 DIAGNOSIS — Z87891 Personal history of nicotine dependence: Secondary | ICD-10-CM

## 2022-05-31 DIAGNOSIS — Z0000 Encounter for general adult medical examination without abnormal findings: Secondary | ICD-10-CM

## 2022-05-31 DIAGNOSIS — Z136 Encounter for screening for cardiovascular disorders: Secondary | ICD-10-CM

## 2022-05-31 LAB — BAYER DCA HB A1C WAIVED: HB A1C (BAYER DCA - WAIVED): 5.5 % (ref 4.8–5.6)

## 2022-05-31 MED ORDER — FINASTERIDE 5 MG PO TABS: 5.0000 mg | ORAL_TABLET | Freq: Every day | ORAL | 1 refills | Status: DC

## 2022-05-31 MED ORDER — METOPROLOL SUCCINATE ER 50 MG PO TB24: ORAL_TABLET | ORAL | 1 refills | Status: DC

## 2022-05-31 MED ORDER — CLOPIDOGREL BISULFATE 75 MG PO TABS: 75.0000 mg | ORAL_TABLET | Freq: Every day | ORAL | 1 refills | Status: DC

## 2022-05-31 MED ORDER — LISINOPRIL 20 MG PO TABS: 20.0000 mg | ORAL_TABLET | Freq: Every day | ORAL | 1 refills | Status: DC

## 2022-05-31 NOTE — Progress Notes (Signed)
Annual Physical Exam  Name: Mark Quinn   MRN: [id number]    DOB: 13-Oct-1945   Date:05/31/2022 Today's Provider: Talitha Givens, MHS, PA-C Introduced myself to the patient as a PA-C and provided education on APPs in clinical practice.          Subjective  Chief Complaint  Chief Complaint  Patient presents with   Annual Exam    HPI  Patient presents for annual CPE .  IPSS Questionnaire (AUA-7):  Over the past month.   1)  How often have you had a sensation of not emptying your bladder completely after you finish urinating?  1 - Less than 1 time in 5  2)  How often have you had to urinate again less than two hours after you finished urinating? 1 - Less than 1 time in 5  3)  How often have you found you stopped and started again several times when you urinated?  2 - Less than half the time  4) How difficult have you found it to postpone urination?  0 - Not at all  5) How often have you had a weak urinary stream?  1 - Less than 1 time in 5  6) How often have you had to push or strain to begin urination?  0 - Not at all  7) How many times did you most typically get up to urinate from the time you went to bed until the time you got up in the morning?  2 - 2 times  Total score:  0-7 mildly symptomatic   8-19 moderately symptomatic   20-35 severely symptomatic     Diet: Overall normal dietary intake- moderate mix of protein, carbs and fats. States he eats about 2 meals per day and snacks  Exercise: he does not regularly exercise. Plays golf several times and walks his dog. States he walks a lot at work   Sleep: States he uses Zquil to help sleep- he works third shift. Thinks he gets about 6-8 hours but it is broken up. Feels well rested overall   Mood:"so-so" reports his wife has some health issues that seem to restrict him getting out and about and staying busy.   Depression: phq 9 is negative    05/31/2022    8:32 AM 11/28/2021    9:48 AM 11/24/2021    8:57 AM 11/24/2021     8:56 AM 11/20/2021    9:29 AM  Depression screen PHQ 2/9  Decreased Interest 0 0 0 0 0  Down, Depressed, Hopeless 0 0 0 0 0  PHQ - 2 Score 0 0 0 0 0  Altered sleeping 0 0 0 0 0  Tired, decreased energy 0 0 0 0 0  Change in appetite 0 0 0 0 0  Feeling bad or failure about yourself  0 0 0 0 0  Trouble concentrating 0 0 0 0 0  Moving slowly or fidgety/restless 0 0 0 0 0  Suicidal thoughts 0 0 0 0 0  PHQ-9 Score 0 0 0 0 0  Difficult doing work/chores Not difficult at all Not difficult at all Not difficult at all Not difficult at all     Hypertension:  BP Readings from Last 3 Encounters:  05/31/22 129/78  12/26/21 128/72  11/28/21 117/80    Obesity: Wt Readings from Last 3 Encounters:  05/31/22 195 lb 3.2 oz (88.5 kg)  12/26/21 196 lb (88.9 kg)  11/28/21 197 lb (89.4 kg)  BMI Readings from Last 3 Encounters:  05/31/22 28.01 kg/m  12/26/21 28.12 kg/m  11/28/21 28.20 kg/m     Lipids:  Lab Results  Component Value Date   CHOL 198 05/22/2021   CHOL 179 10/17/2020   CHOL 167 04/04/2020   Lab Results  Component Value Date   HDL 45 05/22/2021   HDL 43 10/17/2020   HDL 43 04/04/2020   Lab Results  Component Value Date   LDLCALC 116 (H) 05/22/2021   LDLCALC 101 (H) 10/17/2020   LDLCALC 88 04/04/2020   Lab Results  Component Value Date   TRIG 211 (H) 05/22/2021   TRIG 201 (H) 10/17/2020   TRIG 213 (H) 04/04/2020   Lab Results  Component Value Date   CHOLHDL 4.4 05/22/2021   CHOLHDL 4.1 11/21/2017   CHOLHDL 3.8 11/12/2016   No results found for: "LDLDIRECT" Glucose:  Glucose  Date Value Ref Range Status  11/28/2021 132 (H) 70 - 99 mg/dL Final  05/22/2021 90 70 - 99 mg/dL Final    Comment:                  **Please note reference interval change**  10/17/2020 92 65 - 99 mg/dL Final       Married STD testing and prevention (HIV/chl/gon/syphilis):  no Hep C Screening: completed  Skin cancer: Discussed monitoring for atypical lesions Colorectal  cancer: Up to date, next due in 2025 Prostate cancer:  yes- ordered today  No results found for: "PSA"   Lung cancer:  Low Dose CT Chest recommended if Age 86-80 years, 30 pack-year currently smoking OR have quit w/in 15years. Patient  not applicable AAA: The USPSTF recommends one-time screening with ultrasonography in men ages 62 to 81 years who have ever smoked. Patient:  yes- he smoked when he was  a teenager but quit about 60 years ago.   ECG:  NA  Vaccines:   HPV: Aged out Tdap: Up to date, next due in 2031 Shingrix: first shot completed today, will schedule second for 2-4 months from now Pneumonia: Completed Flu: Completed COVID-19: 3 vaccines completed- discussed newest booster and importance of vaccines in preventing disease   Advanced Care Planning: A voluntary discussion about advance care planning including the explanation and discussion of advance directives.  Discussed health care proxy and Living will, and the patient was able to identify a health care proxy as no one.  Patient does not have a living will in effect  Patient Active Problem List   Diagnosis Date Noted   Rash and nonspecific skin eruption 11/24/2021   Acute left-sided low back pain without sciatica 11/03/2021   Muscle spasm 10/27/2021   Acute non-recurrent maxillary sinusitis 07/24/2021   Annual physical exam 05/22/2021   BMI 28.0-28.9,adult 04/04/2020   Gastroesophageal reflux disease without esophagitis 04/04/2020   Arthritis 12/03/2018   Aortic valve sclerosis 11/21/2017   Anemia, unspecified 05/20/2017   BPH (benign prostatic hyperplasia) 10/28/2015   Essential hypertension, benign 04/13/2015   Hyperlipemia 04/13/2015   Gout 04/13/2015   CAD (coronary artery disease) 04/13/2015    Past Surgical History:  Procedure Laterality Date   CARDIAC CATHETERIZATION  2006   [facility]   CORONARY STENT PLACEMENT  2006    CYPHER drug eluting Stent RX 3.50 X 42m/CYPHER drug eluting Stent RX 3.50 X 278m    Family History  Problem Relation Age of Onset   Heart disease Mother    Hyperlipidemia Mother    Diabetes Son  Social History   Socioeconomic History   Marital status: Married    Spouse name: Not on file   Number of children: Not on file   Years of education: Not on file   Highest education level: Not on file  Occupational History   Not on file  Tobacco Use   Smoking status: Former    Packs/day: 1.00    Years: 8.00    Total pack years: 8.00    Types: Cigarettes    Quit date: 04/13/1975    Years since quitting: 47.1   Smokeless tobacco: Never  Vaping Use   Vaping Use: Never used  Substance and Sexual Activity   Alcohol use: Yes    Alcohol/week: 0.0 standard drinks of alcohol    Comment: on occasion   Drug use: No   Sexual activity: Not on file  Other Topics Concern   Not on file  Social History Narrative   Not on file   Social Determinants of Health   Financial Resource Strain: Not on file  Food Insecurity: Not on file  Transportation Needs: Not on file  Physical Activity: Not on file  Stress: Not on file  Social Connections: Not on file  Intimate Partner Violence: Not on file     Current Outpatient Medications:    allopurinol (ZYLOPRIM) 100 MG tablet, Take 1 tablet (100 mg total) by mouth daily., Disp: 90 tablet, Rfl: 2   diclofenac Sodium (VOLTAREN) 1 % GEL, Apply 2 g topically 4 (four) times daily., Disp: 2 g, Rfl: 0   diphenhydramine-acetaminophen (TYLENOL PM) 25-500 MG TABS tablet, Take 1 tablet by mouth at bedtime as needed., Disp: , Rfl:    ezetimibe (ZETIA) 10 MG tablet, Take 1 tablet (10 mg total) by mouth daily., Disp: 90 tablet, Rfl: 3   meloxicam (MOBIC) 15 MG tablet, Take 1 tablet every day by oral route., Disp: , Rfl:    Multiple Vitamin (MULTIVITAMIN) tablet, Take 1 tablet by mouth daily., Disp: , Rfl:    rosuvastatin (CRESTOR) 40 MG tablet, Take 1 tablet (40 mg total) by mouth daily., Disp: 90 tablet, Rfl: 3   clopidogrel (PLAVIX) 75  MG tablet, Take 1 tablet (75 mg total) by mouth daily., Disp: 90 tablet, Rfl: 1   finasteride (PROSCAR) 5 MG tablet, Take 1 tablet (5 mg total) by mouth daily., Disp: 90 tablet, Rfl: 1   lisinopril (ZESTRIL) 20 MG tablet, Take 1 tablet (20 mg total) by mouth daily., Disp: 90 tablet, Rfl: 1   metoprolol succinate (TOPROL-XL) 50 MG 24 hr tablet, TAKE 1 TABLET DAILY WITH OR IMMEDIATELY FOLLOWING A MEAL, Disp: 90 tablet, Rfl: 1   pregabalin (LYRICA) 50 MG capsule, Take 1 capsule (50 mg total) by mouth every evening., Disp: 30 capsule, Rfl: 5  Allergies  Allergen Reactions   Other     Seasonal Allergies     Review of Systems  Constitutional:  Negative for chills, diaphoresis, fever and malaise/fatigue.  HENT:  Negative for hearing loss, sinus pain, sore throat and tinnitus.   Eyes:  Negative for blurred vision and double vision.  Respiratory:  Negative for cough, shortness of breath and wheezing.   Cardiovascular:  Negative for chest pain, palpitations and leg swelling.  Gastrointestinal:  Negative for abdominal pain, blood in stool, constipation, diarrhea, heartburn, nausea and vomiting.  Genitourinary:  Negative for dysuria, frequency, hematuria and urgency.  Musculoskeletal:  Negative for falls, joint pain and myalgias.  Neurological:  Negative for dizziness, tingling, tremors, loss of consciousness, weakness and  headaches.  Psychiatric/Behavioral:  Negative for depression and memory loss. The patient is not nervous/anxious and does not have insomnia.       Objective  Vitals:   05/31/22 0824  BP: 129/78  Pulse: 63  Temp: 98 F (36.7 C)  TempSrc: Oral  SpO2: 98%  Weight: 195 lb 3.2 oz (88.5 kg)  Height: '5\' 10"'$  (1.778 m)    Body mass index is 28.01 kg/m.  Physical Exam Vitals reviewed.  Constitutional:      General: He is awake.     Appearance: Normal appearance. He is well-developed, well-groomed and overweight.  HENT:     Head: Normocephalic and atraumatic.     Right  Ear: Ear canal and external ear normal. There is impacted cerumen.     Left Ear: Ear canal and external ear normal. There is impacted cerumen.     Mouth/Throat:     Lips: Pink.     Mouth: Mucous membranes are moist.     Pharynx: Oropharynx is clear. Uvula midline.  Eyes:     General: Lids are normal. Gaze aligned appropriately.     Extraocular Movements: Extraocular movements intact.     Right eye: Normal extraocular motion and no nystagmus.     Left eye: Normal extraocular motion and no nystagmus.     Conjunctiva/sclera: Conjunctivae normal.     Pupils: Pupils are equal, round, and reactive to light.  Neck:     Thyroid: No thyroid mass, thyromegaly or thyroid tenderness.  Cardiovascular:     Rate and Rhythm: Normal rate and regular rhythm.     Pulses: Normal pulses.          Radial pulses are 2+ on the right side and 2+ on the left side.     Heart sounds: Normal heart sounds. No murmur heard.    No friction rub. No gallop.  Pulmonary:     Effort: Pulmonary effort is normal.     Breath sounds: Normal breath sounds. No decreased air movement. No decreased breath sounds, wheezing, rhonchi or rales.  Abdominal:     General: Abdomen is flat. Bowel sounds are normal.     Palpations: Abdomen is soft.     Tenderness: There is no abdominal tenderness. There is no guarding. Negative signs include Murphy's sign.  Musculoskeletal:     Cervical back: Normal range of motion.     Right lower leg: No edema.     Left lower leg: No edema.  Lymphadenopathy:     Head:     Right side of head: No submental, submandibular or preauricular adenopathy.     Left side of head: No submental, submandibular or preauricular adenopathy.     Upper Body:     Right upper body: No supraclavicular adenopathy.     Left upper body: No supraclavicular adenopathy.  Neurological:     General: No focal deficit present.     Mental Status: He is alert and oriented to person, place, and time.     GCS: GCS eye subscore  is 4. GCS verbal subscore is 5. GCS motor subscore is 6.     Cranial Nerves: Cranial nerves 2-12 are intact. No dysarthria or facial asymmetry.     Motor: No weakness, tremor or abnormal muscle tone.     Deep Tendon Reflexes:     Reflex Scores:      Patellar reflexes are 2+ on the right side and 2+ on the left side. Psychiatric:  Attention and Perception: Attention and perception normal.        Mood and Affect: Mood and affect normal.        Speech: Speech normal.        Behavior: Behavior normal. Behavior is cooperative.        Thought Content: Thought content normal.        Cognition and Memory: Cognition and memory normal.        Judgment: Judgment normal.      No results found for this or any previous visit (from the past 2160 hour(s)).   Fall Risk:    05/31/2022    8:32 AM 11/28/2021    9:47 AM 11/24/2021    8:56 AM 11/20/2021    9:25 AM 10/27/2021   10:43 AM  Fall Risk   Falls in the past year? 0 0 0 0 0  Number falls in past yr: 0 0 0 0 0  Injury with Fall? 0 0 0 0 0  Risk for fall due to : No Fall Risks No Fall Risks No Fall Risks No Fall Risks No Fall Risks  Follow up Falls evaluation completed Falls evaluation completed Falls evaluation completed Falls evaluation completed Falls evaluation completed     Functional Status Survey:      Assessment & Plan  Problem List Items Addressed This Visit       Cardiovascular and Mediastinum   Essential hypertension, benign    Chronic, historic condition Appears well managed on Lisinopril 20 mg PO QD and Metoprolol 50 mg pO QD - continue current regimen Recommend incorporating diet and exercise measures to further manage CVD health Follow up in 6 months for monitoring       Relevant Medications   lisinopril (ZESTRIL) 20 MG tablet   metoprolol succinate (TOPROL-XL) 50 MG 24 hr tablet   CAD (coronary artery disease)    Had stents placed about 20 years ago  On plavix - will send refills today Follow up in 6  months for monitoring         Relevant Medications   clopidogrel (PLAVIX) 75 MG tablet   lisinopril (ZESTRIL) 20 MG tablet   metoprolol succinate (TOPROL-XL) 50 MG 24 hr tablet   Other Relevant Orders   Lipid panel     Genitourinary   BPH (benign prostatic hyperplasia)    Chronic, historic condition Currently taking Finasteride 5 mg PO QD and appears to be tolerating well Refills provided today Continue current regimen and follow up as needed for progressing symptoms or concerns       Relevant Medications   finasteride (PROSCAR) 5 MG tablet   Other Relevant Orders   PSA Total+%Free (Serial)     Other   Hyperlipemia    Chronic, historic condition Appears to be well managed on Rosuvastatin 40 mg PO QD and is tolerating well Will recheck cholesterol levels today - results to dictate further management  Recommend follow up in 6 months for monitoring or sooner if concerns arise       Relevant Medications   lisinopril (ZESTRIL) 20 MG tablet   metoprolol succinate (TOPROL-XL) 50 MG 24 hr tablet   Other Relevant Orders   Lipid panel   Annual physical exam - Primary    -Prostate cancer screening and PSA options (with potential risks and benefits of testing vs not testing) were discussed along with recent recs/guidelines. -USPSTF grade A and B recommendations reviewed with patient; age-appropriate recommendations, preventive care, screening tests, etc discussed and encouraged; healthy  living encouraged; see AVS for patient education given to patient -Discussed importance of 150 minutes of physical activity weekly, eat two servings of fish weekly, eat one serving of tree nuts ( cashews, pistachios, pecans, almonds.Marland Kitchen) every other day, eat 6 servings of fruit/vegetables daily and drink plenty of water and avoid sweet beverages.  -Reviewed Health Maintenance: yes      Relevant Orders   PSA Total+%Free (Serial)   Comprehensive metabolic panel   CBC with Differential/Platelet    Bayer DCA Hb A1c Waived   Other Visit Diagnoses     Screening for thyroid disorder       Relevant Orders   Thyroid Panel With TSH   Screening for diabetes mellitus       Relevant Orders   Bayer DCA Hb A1c Waived   Encounter for screening for abdominal aortic aneurysm (AAA) in patient 29 years of age or older with history of smoking       Relevant Orders   [place] AORTA MEDICARE SCREENING   Need for shingles vaccine       Relevant Orders   Zoster Recombinant (Shingrix ) (Completed)        Return in about 6 months (around 11/30/2022) for HTN, HLD, .   I, Ilijah Doucet E Hurbert Duran, PA-C, have reviewed all documentation for this visit. The documentation on 05/31/22 for the exam, diagnosis, procedures, and orders are all accurate and complete.   Talitha Givens, MHS, PA-C [facility]

## 2022-05-31 NOTE — Assessment & Plan Note (Signed)
Chronic, historic condition Appears well managed on Lisinopril 20 mg PO QD and Metoprolol 50 mg pO QD - continue current regimen Recommend incorporating diet and exercise measures to further manage CVD health Follow up in 6 months for monitoring

## 2022-05-31 NOTE — Assessment & Plan Note (Signed)
-  Prostate cancer screening and PSA options (with potential risks and benefits of testing vs not testing) were discussed along with recent recs/guidelines. -USPSTF grade A and B recommendations reviewed with patient; age-appropriate recommendations, preventive care, screening tests, etc discussed and encouraged; healthy living encouraged; see AVS for patient education given to patient -Discussed importance of 150 minutes of physical activity weekly, eat two servings of fish weekly, eat one serving of tree nuts ( cashews, pistachios, pecans, almonds..) every other day, eat 6 servings of fruit/vegetables daily and drink plenty of water and avoid sweet beverages.  -Reviewed Health Maintenance: yes 

## 2022-05-31 NOTE — Assessment & Plan Note (Signed)
Chronic, historic condition Appears to be well managed on Rosuvastatin 40 mg PO QD and is tolerating well Will recheck cholesterol levels today - results to dictate further management  Recommend follow up in 6 months for monitoring or sooner if concerns arise

## 2022-05-31 NOTE — Assessment & Plan Note (Signed)
Chronic, historic condition Currently taking Finasteride 5 mg PO QD and appears to be tolerating well Refills provided today Continue current regimen and follow up as needed for progressing symptoms or concerns

## 2022-05-31 NOTE — Assessment & Plan Note (Addendum)
Had stents placed about 20 years ago  On plavix - will send refills today Follow up in 6 months for monitoring

## 2022-06-02 LAB — COMPREHENSIVE METABOLIC PANEL
ALT: 38 IU/L (ref 0–44)
AST: 26 IU/L (ref 0–40)
Albumin/Globulin Ratio: 2.1 (ref 1.2–2.2)
Albumin: 4.4 g/dL (ref 3.8–4.8)
Alkaline Phosphatase: 49 IU/L (ref 44–121)
BUN/Creatinine Ratio: 22 (ref 10–24)
BUN: 22 mg/dL (ref 8–27)
Bilirubin Total: 0.4 mg/dL (ref 0.0–1.2)
CO2: 21 mmol/L (ref 20–29)
Calcium: 9.3 mg/dL (ref 8.6–10.2)
Chloride: 103 mmol/L (ref 96–106)
Creatinine, Ser: 1.01 mg/dL (ref 0.76–1.27)
Globulin, Total: 2.1 g/dL (ref 1.5–4.5)
Glucose: 84 mg/dL (ref 70–99)
Potassium: 4.4 mmol/L (ref 3.5–5.2)
Sodium: 140 mmol/L (ref 134–144)
Total Protein: 6.5 g/dL (ref 6.0–8.5)
eGFR: 79 mL/min/{1.73_m2} (ref >59)

## 2022-06-02 LAB — LIPID PANEL
Chol/HDL Ratio: 2.6 ratio (ref 0.0–5.0)
Cholesterol, Total: 116 mg/dL (ref 100–199)
HDL: 44 mg/dL (ref >39)
LDL Chol Calc (NIH): 48 mg/dL (ref 0–99)
Triglycerides: 140 mg/dL (ref 0–149)
VLDL Cholesterol Cal: 24 mg/dL (ref 5–40)

## 2022-06-02 LAB — PSA TOTAL+% FREE (SERIAL)
PSA, Free Pct: 16.7 %
PSA, Free: 0.65 ng/mL
Prostate Specific Ag, Serum: 3.9 ng/mL (ref 0.0–4.0)

## 2022-06-02 LAB — CBC WITH DIFFERENTIAL/PLATELET
Basophils Absolute: 0 10*3/uL (ref 0.0–0.2)
Basos: 0 %
EOS (ABSOLUTE): 0.2 10*3/uL (ref 0.0–0.4)
Eos: 3 %
Hematocrit: 34.5 % — ABNORMAL LOW (ref 37.5–51.0)
Hemoglobin: 12.1 g/dL — ABNORMAL LOW (ref 13.0–17.7)
Immature Grans (Abs): 0 10*3/uL (ref 0.0–0.1)
Immature Granulocytes: 0 %
Lymphocytes Absolute: 2.5 10*3/uL (ref 0.7–3.1)
Lymphs: 35 %
MCH: 33.4 pg — ABNORMAL HIGH (ref 26.6–33.0)
MCHC: 35.1 g/dL (ref 31.5–35.7)
MCV: 95 fL (ref 79–97)
Monocytes Absolute: 0.5 10*3/uL (ref 0.1–0.9)
Monocytes: 7 %
Neutrophils Absolute: 4 10*3/uL (ref 1.4–7.0)
Neutrophils: 55 %
Platelets: 205 10*3/uL (ref 150–450)
RBC: 3.62 x10E6/uL — ABNORMAL LOW (ref 4.14–5.80)
RDW: 13.1 % (ref 11.6–15.4)
WBC: 7.3 10*3/uL (ref 3.4–10.8)

## 2022-06-02 LAB — THYROID PANEL WITH TSH
Free Thyroxine Index: 1.4 (ref 1.2–4.9)
T3 Uptake Ratio: 22 % — ABNORMAL LOW (ref 24–39)
T4, Total: 6.3 ug/dL (ref 4.5–12.0)
TSH: 1.52 u[IU]/mL (ref 0.450–4.500)

## 2022-06-18 ENCOUNTER — Telehealth: Payer: Self-pay | Admitting: Nurse Practitioner

## 2022-06-18 NOTE — Telephone Encounter (Signed)
Copied from [place] [phone]. Topic: Medicare AWV >> Jun 18, 2022 11:42 AM Josephina Gip wrote: Reason for CRM: Left message for patient to call back and schedule the Medicare Annual Wellness Visit (AWV) virtually or by telephone.  Last AWV 11/10/15  Please schedule at anytime with [facility]Nurse Health Advisor.  30 minute appointment  Any questions, please call me at [phone]

## 2022-06-25 DIAGNOSIS — H43393 Other vitreous opacities, bilateral: Secondary | ICD-10-CM | POA: Diagnosis not present

## 2022-06-25 DIAGNOSIS — H2513 Age-related nuclear cataract, bilateral: Secondary | ICD-10-CM | POA: Diagnosis not present

## 2022-09-10 ENCOUNTER — Other Ambulatory Visit: Payer: Self-pay | Admitting: Physician Assistant

## 2022-09-10 DIAGNOSIS — M1000 Idiopathic gout, unspecified site: Secondary | ICD-10-CM

## 2022-09-10 NOTE — Telephone Encounter (Signed)
Requested medication (s) are due for refill today: yes  Requested medication (s) are on the active medication list: yes  Last refill:  11/20/21 # 90/2  Future visit scheduled: no  Notes to clinic:  Unable to refill per protocol due to failed labs, no updated results.    Requested Prescriptions  Pending Prescriptions Disp Refills   allopurinol (ZYLOPRIM) 100 MG tablet [Pharmacy Med Name: ALLOPURINOL 100 MG TABLET] 90 tablet 0    Sig: Take 1 tablet (100 mg total) by mouth daily.     Endocrinology:  Gout Agents - allopurinol Failed - 09/10/2022  9:46 AM      Failed - Uric Acid in normal range and within 360 days    Uric Acid  Date Value Ref Range Status  05/22/2021 7.1 3.8 - 8.4 mg/dL Final    Comment:               Therapeutic target for gout patients: <6.0         Passed - Cr in normal range and within 360 days    Creatinine, Ser  Date Value Ref Range Status  05/31/2022 1.01 0.76 - 1.27 mg/dL Final         Passed - Valid encounter within last 12 months    Recent Outpatient Visits           3 months ago Annual physical exam   [facility] Mecum, Erin E, PA-C   9 months ago Rash and nonspecific skin eruption   [facility] Vigg, Avanti, MD   9 months ago Rash and nonspecific skin eruption   [facility] Vigg, Avanti, MD   9 months ago Coronary artery disease due to lipid rich plaque   [facility] Vigg, Avanti, MD   10 months ago Muscle spasm   [facility] [facility] Vigg, Avanti, MD       Future Appointments             In 2 months Cannady, Barbaraann Faster, NP [employer]            Passed - CBC within normal limits and completed in the last 12 months    WBC  Date Value Ref Range Status  05/31/2022 7.3 3.4 - 10.8 x10E3/uL Final   RBC  Date Value Ref Range Status  05/31/2022 3.62 (L) 4.14 - 5.80 x10E6/uL Final   Hemoglobin  Date Value Ref Range Status  05/31/2022 12.1 (L) 13.0 - 17.7 g/dL  Final   Hematocrit  Date Value Ref Range Status  05/31/2022 34.5 (L) 37.5 - 51.0 % Final   MCHC  Date Value Ref Range Status  05/31/2022 35.1 31.5 - 35.7 g/dL Final   [facility]  Date Value Ref Range Status  05/31/2022 33.4 (H) 26.6 - 33.0 pg Final   MCV  Date Value Ref Range Status  05/31/2022 95 79 - 97 fL Final   No results found for: "PLTCOUNTKUC", "LABPLAT", "POCPLA" RDW  Date Value Ref Range Status  05/31/2022 13.1 11.6 - 15.4 % Final

## 2022-10-31 ENCOUNTER — Encounter: Payer: Self-pay | Admitting: Nurse Practitioner

## 2022-11-06 DIAGNOSIS — K080 Exfoliation of teeth due to systemic causes: Secondary | ICD-10-CM | POA: Diagnosis not present

## 2022-11-30 ENCOUNTER — Other Ambulatory Visit: Payer: Self-pay | Admitting: Physician Assistant

## 2022-11-30 ENCOUNTER — Ambulatory Visit: Payer: Medicare Other | Admitting: Nurse Practitioner

## 2022-11-30 DIAGNOSIS — I10 Essential (primary) hypertension: Secondary | ICD-10-CM

## 2022-11-30 DIAGNOSIS — N400 Enlarged prostate without lower urinary tract symptoms: Secondary | ICD-10-CM

## 2022-11-30 DIAGNOSIS — I2510 Atherosclerotic heart disease of native coronary artery without angina pectoris: Secondary | ICD-10-CM

## 2022-11-30 NOTE — Telephone Encounter (Signed)
Requested Prescriptions  Pending Prescriptions Disp Refills   clopidogrel (PLAVIX) 75 MG tablet [Pharmacy Med Name: CLOPIDOGREL 75 MG TABLET] 90 tablet 0    Sig: Take 1 tablet (75 mg total) by mouth daily.     Hematology: Antiplatelets - clopidogrel Failed - 11/30/2022 10:25 AM      Failed - HCT in normal range and within 180 days    Hematocrit  Date Value Ref Range Status  05/31/2022 34.5 (L) 37.5 - 51.0 % Final         Failed - HGB in normal range and within 180 days    Hemoglobin  Date Value Ref Range Status  05/31/2022 12.1 (L) 13.0 - 17.7 g/dL Final         Failed - PLT in normal range and within 180 days    Platelets  Date Value Ref Range Status  05/31/2022 205 150 - 450 x10E3/uL Final         Failed - Valid encounter within last 6 months    Recent Outpatient Visits           6 months ago Annual physical exam   [place] [facility] Mecum, Oswaldo Conroy, PA-C   1 year ago Rash and nonspecific skin eruption   [place] [facility] Vigg, Avanti, MD   1 year ago Rash and nonspecific skin eruption   [place] [facility] Vigg, Avanti, MD   1 year ago Coronary artery disease due to lipid rich plaque   [place] [facility] Vigg, Avanti, MD   1 year ago Muscle spasm   [place] [facility] Vigg, Avanti, MD       Future Appointments             In 3 days [place], Dorie Rank, NP [place] [facility]            Passed - Cr in normal range and within 360 days    Creatinine, Ser  Date Value Ref Range Status  05/31/2022 1.01 0.76 - 1.27 mg/dL Final          lisinopril (ZESTRIL) 20 MG tablet [Pharmacy Med Name: LISINOPRIL 20 MG TABLET] 90 tablet 0    Sig: Take 1 tablet (20 mg total) by mouth daily.     Cardiovascular:  ACE Inhibitors Failed - 11/30/2022 10:25 AM      Failed - Cr in normal range and within 180 days    Creatinine, Ser  Date Value Ref Range Status  05/31/2022  1.01 0.76 - 1.27 mg/dL Final         Failed - K in normal range and within 180 days    Potassium  Date Value Ref Range Status  05/31/2022 4.4 3.5 - 5.2 mmol/L Final         Failed - Valid encounter within last 6 months    Recent Outpatient Visits           6 months ago Annual physical exam   [place] [facility] Mecum, Oswaldo Conroy, PA-C   1 year ago Rash and nonspecific skin eruption   [place] [facility] Vigg, Avanti, MD   1 year ago Rash and nonspecific skin eruption   [place] [facility] Vigg, Avanti, MD   1 year ago Coronary artery disease due to lipid rich plaque    [facility] Vigg, Avanti, MD   1 year ago Muscle spasm  [place] [facility]       Future Appointments             In 3 days [place], Dorie RankJolene T, NP [place] [facility]            Passed - Patient is not pregnant      Passed - Last BP in normal range    BP Readings from Last 1 Encounters:  05/31/22 129/78          metoprolol succinate (TOPROL-XL) 50 MG 24 hr tablet [Pharmacy Med Name: METOPROLOL SUCC ER 50 MG TAB] 90 tablet 0    Sig: TAKE 1 TABLET DAILY WITH OR IMMEDIATELY FOLLOWING A MEAL     Cardiovascular:  Beta Blockers Failed - 11/30/2022 10:25 AM      Failed - Valid encounter within last 6 months    Recent Outpatient Visits           6 months ago Annual physical exam   [place] [facility] Mecum, Oswaldo ConroyErin E, PA-C   1 year ago Rash and nonspecific skin eruption   [place] [facility] Vigg, Avanti, MD   1 year ago Rash and nonspecific skin eruption   [place] [facility] Vigg, Avanti, MD   1 year ago Coronary artery disease due to lipid rich plaque   [place] [facility] Vigg, Avanti, MD   1 year ago Muscle spasm   [place] [facility] Vigg, Avanti, MD       Future Appointments              In 3 days [place], [place], NP [place] [facility]            Passed - Last BP in normal range    BP Readings from Last 1 Encounters:  05/31/22 129/78         Passed - Last Heart Rate in normal range    Pulse Readings from Last 1 Encounters:  05/31/22 63          finasteride (PROSCAR) 5 MG tablet [Pharmacy Med Name: FINASTERIDE 5 MG TABLET] 90 tablet 0    Sig: Take 1 tablet (5 mg total) by mouth daily.     Urology: 5-alpha Reductase Inhibitors Passed - 11/30/2022 10:25 AM      Passed - PSA in normal range and within 360 days    Prostate Specific Ag, Serum  Date Value Ref Range Status  05/31/2022 3.9 0.0 - 4.0 ng/mL Final    Comment:    Roche ECLIA methodology. According to the American Urological Association, Serum PSA should decrease and remain at undetectable levels after radical prostatectomy. The AUA defines biochemical recurrence as an initial PSA value 0.2 ng/mL or greater followed by a subsequent confirmatory PSA value 0.2 ng/mL or greater. Values obtained with different assay methods or kits cannot be used interchangeably. Results cannot be interpreted as absolute evidence of the presence or absence of malignant disease.          Passed - Valid encounter within last 12 months    Recent Outpatient Visits           6 months ago Annual physical exam   [place] [facility] Mecum, Erin E, PA-C   1 year ago Rash and nonspecific skin eruption   [place] [facility] Vigg, Avanti, MD   1 year ago Rash and nonspecific  skin eruption   [place] [facility]   1 year ago Coronary artery disease due to lipid rich plaque   [place] [facility] Vigg, Avanti, MD   1 year ago Muscle spasm   [place] [facility] Vigg, Avanti, MD       Future Appointments             In 3 days [place], Dorie RankJolene T, NP  [facility]

## 2022-12-01 NOTE — Patient Instructions (Signed)

## 2022-12-03 ENCOUNTER — Encounter: Payer: Self-pay | Admitting: Nurse Practitioner

## 2022-12-03 ENCOUNTER — Ambulatory Visit (INDEPENDENT_AMBULATORY_CARE_PROVIDER_SITE_OTHER): Payer: Medicare Other | Admitting: Nurse Practitioner

## 2022-12-03 VITALS — BP 111/70 | HR 56 | Temp 98.0°F | Ht 70.0 in | Wt 186.1 lb

## 2022-12-03 DIAGNOSIS — I10 Essential (primary) hypertension: Secondary | ICD-10-CM | POA: Diagnosis not present

## 2022-12-03 DIAGNOSIS — Z0000 Encounter for general adult medical examination without abnormal findings: Secondary | ICD-10-CM

## 2022-12-03 DIAGNOSIS — M1000 Idiopathic gout, unspecified site: Secondary | ICD-10-CM

## 2022-12-03 DIAGNOSIS — I358 Other nonrheumatic aortic valve disorders: Secondary | ICD-10-CM

## 2022-12-03 DIAGNOSIS — E782 Mixed hyperlipidemia: Secondary | ICD-10-CM

## 2022-12-03 DIAGNOSIS — D649 Anemia, unspecified: Secondary | ICD-10-CM

## 2022-12-03 DIAGNOSIS — N400 Enlarged prostate without lower urinary tract symptoms: Secondary | ICD-10-CM

## 2022-12-03 DIAGNOSIS — I2510 Atherosclerotic heart disease of native coronary artery without angina pectoris: Secondary | ICD-10-CM

## 2022-12-03 LAB — MICROALBUMIN, URINE WAIVED
Creatinine, Urine Waived: 100 mg/dL (ref 10–300)
Microalb, Ur Waived: 30 mg/L — ABNORMAL HIGH (ref 0–19)

## 2022-12-03 MED ORDER — METOPROLOL SUCCINATE ER 50 MG PO TB24
ORAL_TABLET | ORAL | 4 refills | Status: DC
Start: 2022-12-03 — End: 2023-12-17

## 2022-12-03 MED ORDER — FINASTERIDE 5 MG PO TABS: 5.0000 mg | ORAL_TABLET | Freq: Every day | ORAL | 4 refills | Status: DC

## 2022-12-03 MED ORDER — EZETIMIBE 10 MG PO TABS: 10.0000 mg | ORAL_TABLET | Freq: Every day | ORAL | 4 refills | Status: DC

## 2022-12-03 MED ORDER — LISINOPRIL 20 MG PO TABS: 20.0000 mg | ORAL_TABLET | Freq: Every day | ORAL | 4 refills | Status: DC

## 2022-12-03 MED ORDER — ROSUVASTATIN CALCIUM 40 MG PO TABS: 40.0000 mg | ORAL_TABLET | Freq: Every day | ORAL | 4 refills | Status: DC

## 2022-12-03 MED ORDER — ALLOPURINOL 100 MG PO TABS: 100.0000 mg | ORAL_TABLET | Freq: Every day | ORAL | 4 refills | Status: DC

## 2022-12-03 MED ORDER — CLOPIDOGREL BISULFATE 75 MG PO TABS: 75.0000 mg | ORAL_TABLET | Freq: Every day | ORAL | 4 refills | Status: DC

## 2022-12-03 NOTE — Assessment & Plan Note (Signed)
Chronic, stable.  No recent CP.  Continue current medication regimen and collaboration with cardiology.  Continue Plavix, history of stents.

## 2022-12-03 NOTE — Progress Notes (Signed)
BP 111/70   Pulse (!) 56   Temp 98 F (36.7 C) (Oral)   Ht 5\' 10"  (1.778 m)   Wt 186 lb 1.6 oz (84.4 kg)   SpO2 98%   BMI 26.70 kg/m    Subjective:    Patient ID: Mark Quinn, male    DOB: [birth year], 77 y.o.   MRN: [id number]  HPI: Mark Quinn is a 77 y.o. male presenting on 12/03/2022 for Medicare Wellness and follow-up. Current medical complaints include:none  He currently lives with: wife Interim Problems from his last visit: no  HYPERTENSION / HYPERLIPIDEMIA Continues on Lisinopril, Metoprolol, Rosuvastatin, Zetia, Plavix.  Last saw cardiology 12/26/21 -- increased Rosuvastatin to 40 and started Zetia.  History of stents placed 15 years ago. Satisfied with current treatment? yes Duration of hypertension: chronic BP monitoring frequency: monthly BP range: 120/80 range BP medication side effects: no Duration of hyperlipidemia: chronic Cholesterol medication side effects: no Cholesterol supplements: none Medication compliance: good compliance Aspirin:  plavix Recent stressors: no Recurrent headaches: no Visual changes: no Palpitations: no Dyspnea: no Chest pain: no Lower extremity edema: no Dizzy/lightheaded: no  ANEMIA Noticed on past labs, waxes and wanes. Due for colonoscopy next year. Anemia status: stable Etiology of anemia: unknown Duration of anemia treatment:  Severity of anemia: mild Fatigue: no Decreased exercise tolerance: no  Dyspnea on exertion:  occasional with heavy gardening Palpitations: no Bleeding: no Pica: no   Functional Status Survey: Is the patient deaf or have difficulty hearing?: No Does the patient have difficulty seeing, even when wearing glasses/contacts?: No Does the patient have difficulty concentrating, remembering, or making decisions?: No Does the patient have difficulty walking or climbing stairs?: No Does the patient have difficulty dressing or bathing?: No Does the patient have difficulty doing errands alone such  as visiting a doctor's office or shopping?: No      12/03/2022   11:15 AM 05/31/2022    8:32 AM 11/28/2021    9:48 AM 11/24/2021    8:57 AM 11/24/2021    8:56 AM  Depression screen PHQ 2/9  Decreased Interest 0 0 0 0 0  Down, Depressed, Hopeless 0 0 0 0 0  PHQ - 2 Score 0 0 0 0 0  Altered sleeping 0 0 0 0 0  Tired, decreased energy 1 0 0 0 0  Change in appetite 0 0 0 0 0  Feeling bad or failure about yourself  0 0 0 0 0  Trouble concentrating 0 0 0 0 0  Moving slowly or fidgety/restless 0 0 0 0 0  Suicidal thoughts 0 0 0 0 0  PHQ-9 Score 1 0 0 0 0  Difficult doing work/chores Not difficult at all Not difficult at all Not difficult at all Not difficult at all Not difficult at all       12/03/2022   11:15 AM 05/31/2022    8:32 AM 11/28/2021    9:48 AM 11/24/2021    8:57 AM  GAD 7 : Generalized Anxiety Score  Nervous, Anxious, on Edge 0 0 0 0  Control/stop worrying 1 0 0 0  Worry too much - different things 0 0 0 0  Trouble relaxing 0 0 0 0  Restless 0 0 0 0  Easily annoyed or irritable 1 0 0 0  Afraid - awful might happen 0 0 0 0  Total GAD 7 Score 2 0 0 0  Anxiety Difficulty Not difficult at all  Not difficult at all Not difficult  at all        11/24/2021    8:56 AM 11/28/2021    9:47 AM 05/31/2022    8:32 AM 12/03/2022   11:15 AM 12/03/2022   11:28 AM  Fall Risk  Falls in the past year? 0 0 0 0 0  Was there an injury with Fall? 0 0 0 0 0  Fall Risk Category Calculator 0 0 0 0 0  Fall Risk Category (Retired) Low Low Low    Patient at Risk for Falls Due to No Fall Risks No Fall Risks No Fall Risks No Fall Risks No Fall Risks  Fall risk Follow up Falls evaluation completed Falls evaluation completed Falls evaluation completed Falls evaluation completed Falls prevention discussed    Advanced Directives Does patient have a HCPOA?    no If yes, name and contact information:  Does patient have a living will or MOST form?  no  Past Medical History:  Past Medical History:  Diagnosis  Date   CAD (coronary artery disease)    Cellulitis    of back   Gout    Melanoma 06/2013   Stage 1 A Excision   Obesity     Surgical History:  Past Surgical History:  Procedure Laterality Date   CARDIAC CATHETERIZATION  2006   [facility]   CORONARY STENT PLACEMENT  2006    CYPHER drug eluting Stent RX 3.50 X 61mm/CYPHER drug eluting Stent RX 3.50 X 28mm    Medications:  Current Outpatient Medications on File Prior to Visit  Medication Sig   diphenhydramine-acetaminophen (TYLENOL PM) 25-500 MG TABS tablet Take 1 tablet by mouth at bedtime as needed.   meloxicam (MOBIC) 15 MG tablet Take 1 tablet every day by oral route.   Multiple Vitamin (MULTIVITAMIN) tablet Take 1 tablet by mouth daily.   No current facility-administered medications on file prior to visit.    Allergies:  Allergies  Allergen Reactions   Other     Seasonal Allergies    Social History:  Social History   Socioeconomic History   Marital status: Married    Spouse name: Not on file   Number of children: Not on file   Years of education: Not on file   Highest education level: Not on file  Occupational History   Not on file  Tobacco Use   Smoking status: Former    Packs/day: 1.00    Years: 8.00    Additional pack years: 0.00    Total pack years: 8.00    Types: Cigarettes    Quit date: 04/13/1975    Years since quitting: 47.6   Smokeless tobacco: Never  Vaping Use   Vaping Use: Never used  Substance and Sexual Activity   Alcohol use: Yes    Alcohol/week: 0.0 standard drinks of alcohol    Comment: on occasion   Drug use: No   Sexual activity: Not on file  Other Topics Concern   Not on file  Social History Narrative   Not on file   Social Determinants of Health   Financial Resource Strain: Low Risk  (12/03/2022)   Overall Financial Resource Strain (CARDIA)    Difficulty of Paying Living Expenses: Not hard at all  Food Insecurity: No Food Insecurity (12/03/2022)   Hunger Vital Sign    Worried  About Running Out of Food in the Last Year: Never true    Ran Out of Food in the Last Year: Never true  Transportation Needs: No Transportation Needs (12/03/2022)  PRAPARE - Administrator, Civil Service (Medical): No    Lack of Transportation (Non-Medical): No  Physical Activity: Sufficiently Active (12/03/2022)   Exercise Vital Sign    Days of Exercise per Week: 5 days    Minutes of Exercise per Session: 30 min  Stress: No Stress Concern Present (12/03/2022)   [employer] of Occupational Health - Occupational Stress Questionnaire    Feeling of Stress : Not at all  Social Connections: Moderately Integrated (12/03/2022)   Social Connection and Isolation Panel [NHANES]    Frequency of Communication with Friends and Family: Twice a week    Frequency of Social Gatherings with Friends and Family: Twice a week    Attends Religious Services: More than 4 times per year    Active Member of [employer] or Organizations: No    Attends Banker Meetings: Never    Marital Status: Married  Catering manager Violence: Not At Risk (12/03/2022)   Humiliation, Afraid, Rape, and Kick questionnaire    Fear of Current or Ex-Partner: No    Emotionally Abused: No    Physically Abused: No    Sexually Abused: No   Social History   Tobacco Use  Smoking Status Former   Packs/day: 1.00   Years: 8.00   Additional pack years: 0.00   Total pack years: 8.00   Types: Cigarettes   Quit date: 04/13/1975   Years since quitting: 47.6  Smokeless Tobacco Never   Social History   Substance and Sexual Activity  Alcohol Use Yes   Alcohol/week: 0.0 standard drinks of alcohol   Comment: on occasion    Family History:  Family History  Problem Relation Age of Onset   Heart disease Mother    Hyperlipidemia Mother    Diabetes Son     Past medical history, surgical history, medications, allergies, family history and social history reviewed with patient today and changes made to appropriate areas  of the chart.   ROS All other ROS negative except what is listed above and in the HPI.      Objective:    BP 111/70   Pulse (!) 56   Temp 98 F (36.7 C) (Oral)   Ht 5\' 10"  (1.778 m)   Wt 186 lb 1.6 oz (84.4 kg)   SpO2 98%   BMI 26.70 kg/m   Wt Readings from Last 3 Encounters:  12/03/22 186 lb 1.6 oz (84.4 kg)  05/31/22 195 lb 3.2 oz (88.5 kg)  12/26/21 196 lb (88.9 kg)    Physical Exam Vitals and nursing note reviewed.  Constitutional:      General: He is awake. He is not in acute distress.    Appearance: He is well-developed and well-groomed. He is not ill-appearing or toxic-appearing.  HENT:     Head: Normocephalic.     Right Ear: Hearing and external ear normal.     Left Ear: Hearing and external ear normal.  Eyes:     General: Lids are normal.     Extraocular Movements: Extraocular movements intact.     Conjunctiva/sclera: Conjunctivae normal.  Neck:     Thyroid: No thyromegaly.     Vascular: No carotid bruit.  Cardiovascular:     Rate and Rhythm: Regular rhythm. Bradycardia present.     Heart sounds: Normal heart sounds. No murmur heard.    No gallop.  Pulmonary:     Effort: No accessory muscle usage or respiratory distress.     Breath sounds: Normal breath sounds.  Abdominal:     General: Bowel sounds are normal. There is no distension.     Palpations: Abdomen is soft.     Tenderness: There is no abdominal tenderness.  Musculoskeletal:     Cervical back: Full passive range of motion without pain.     Right lower leg: No edema.     Left lower leg: No edema.  Lymphadenopathy:     Cervical: No cervical adenopathy.  Skin:    General: Skin is warm.     Capillary Refill: Capillary refill takes less than 2 seconds.  Neurological:     Mental Status: He is alert and oriented to person, place, and time.     Deep Tendon Reflexes: Reflexes are normal and symmetric.     Reflex Scores:      Brachioradialis reflexes are 2+ on the right side and 2+ on the left  side.      Patellar reflexes are 2+ on the right side and 2+ on the left side. Psychiatric:        Attention and Perception: Attention normal.        Mood and Affect: Mood normal.        Speech: Speech normal.        Behavior: Behavior normal. Behavior is cooperative.        Thought Content: Thought content normal.       12/03/2022   11:30 AM  6CIT Screen  What Year? 0 points  What month? 0 points  What time? 0 points  Count back from 20 0 points  Months in reverse 0 points  Repeat phrase 0 points  Total Score 0 points   Results for orders placed or performed in visit on 12/03/22  Microalbumin, Urine Waived  Result Value Ref Range   Microalb, Ur Waived 30 (H) 0 - 19 mg/L   Creatinine, Urine Waived 100 10 - 300 mg/dL   Microalb/Creat Ratio 30-300 (H) <30 mg/g      Assessment & Plan:   Problem List Items Addressed This Visit       Cardiovascular and Mediastinum   Aortic valve sclerosis    Chronic, ongoing.  Continue collaboration with cardiology and current medications.      Relevant Medications   ezetimibe (ZETIA) 10 MG tablet   lisinopril (ZESTRIL) 20 MG tablet   metoprolol succinate (TOPROL-XL) 50 MG 24 hr tablet   rosuvastatin (CRESTOR) 40 MG tablet   CAD (coronary artery disease)    Chronic, stable.  No recent CP.  Continue current medication regimen and collaboration with cardiology.  Continue Plavix, history of stents.      Relevant Medications   clopidogrel (PLAVIX) 75 MG tablet   ezetimibe (ZETIA) 10 MG tablet   lisinopril (ZESTRIL) 20 MG tablet   metoprolol succinate (TOPROL-XL) 50 MG 24 hr tablet   rosuvastatin (CRESTOR) 40 MG tablet   Other Relevant Orders   Comprehensive metabolic panel   Lipid Panel w/o Chol/HDL Ratio   Essential hypertension, benign    Chronic, ongoing.  BP at goal in office and on home readings. Recommend he monitor BP at least a few mornings a week at home and document.  DASH diet at home.  Continue current medication regimen  and adjust as needed.  Labs today: CBC, CMP, urine ALB.  Urine ALB 25 December 2022, continue Lisinopril for kidney protection.  Refills sent in.  Return in 6 months.       Relevant Medications   ezetimibe (ZETIA) 10 MG tablet  lisinopril (ZESTRIL) 20 MG tablet   metoprolol succinate (TOPROL-XL) 50 MG 24 hr tablet   rosuvastatin (CRESTOR) 40 MG tablet   Other Relevant Orders   Microalbumin, Urine Waived (Completed)   CBC with Differential/Platelet   Comprehensive metabolic panel     Genitourinary   BPH (benign prostatic hyperplasia)   Relevant Medications   finasteride (PROSCAR) 5 MG tablet     Other   Gout   Relevant Medications   allopurinol (ZYLOPRIM) 100 MG tablet   Hyperlipemia    Chronic, ongoing.  Continue current medication regimen and adjust as needed.  Lipid panel today.         Relevant Medications   ezetimibe (ZETIA) 10 MG tablet   lisinopril (ZESTRIL) 20 MG tablet   metoprolol succinate (TOPROL-XL) 50 MG 24 hr tablet   rosuvastatin (CRESTOR) 40 MG tablet   Other Relevant Orders   Comprehensive metabolic panel   Lipid Panel w/o Chol/HDL Ratio   Low hemoglobin    Waxing and waning past labs.  No red flags on exam.  Due for colonoscopy next year, denies rectal bleeding.  Recheck CBC, iron, ferritin.  Take multivitamin as currently doing.      Relevant Orders   Iron Binding Cap (TIBC)([facility])   Ferritin   Other Visit Diagnoses     Medicare annual wellness visit, subsequent    -  Primary   Medicare Wellness due and performed with patient today.       LABORATORY TESTING:  Health maintenance labs ordered today as discussed above.   IMMUNIZATIONS:   - Tdap: Tetanus vaccination status reviewed: last tetanus booster within 10 years. - Influenza: Up to date - Pneumovax: Up to date - Prevnar: Up to date - Zostavax vaccine:  x 1 will get second at drugstore  SCREENING: - Colonoscopy: Up to date  Discussed with patient purpose of the colonoscopy  is to detect colon cancer at curable precancerous or early stages   - AAA Screening: Not applicable  -Hearing Test: Not applicable  -Spirometry: Not applicable   PATIENT COUNSELING:    Sexuality: Discussed sexually transmitted diseases, partner selection, use of condoms, avoidance of unintended pregnancy  and contraceptive alternatives.   Advised to avoid cigarette smoking.  I discussed with the patient that most people either abstain from alcohol or drink within safe limits (<=14/week and <=4 drinks/occasion for males, <=7/weeks and <= 3 drinks/occasion for females) and that the risk for alcohol disorders and other health effects rises proportionally with the number of drinks per week and how often a drinker exceeds daily limits.  Discussed cessation/primary prevention of drug use and availability of treatment for abuse.   Diet: Encouraged to adjust caloric intake to maintain  or achieve ideal body weight, to reduce intake of dietary saturated fat and total fat, to limit sodium intake by avoiding high sodium foods and not adding table salt, and to maintain adequate dietary potassium and calcium preferably from fresh fruits, vegetables, and low-fat dairy products.    Stressed the importance of regular exercise  Injury prevention: Discussed safety belts, safety helmets, smoke detector, smoking near bedding or upholstery.   Dental health: Discussed importance of regular tooth brushing, flossing, and dental visits.   Follow up plan: NEXT PREVENTATIVE PHYSICAL DUE IN 1 YEAR. Return in about 6 months (around 06/04/2023) for HTN/HLD, GOUT, ANEMIA, GERD, BPH.

## 2022-12-03 NOTE — Assessment & Plan Note (Signed)
Chronic, ongoing.  Continue collaboration with cardiology and current medications.

## 2022-12-03 NOTE — Assessment & Plan Note (Signed)
Chronic, ongoing.  Continue current medication regimen and adjust as needed. Lipid panel today. 

## 2022-12-03 NOTE — Assessment & Plan Note (Signed)
Chronic, ongoing.  BP at goal in office and on home readings. Recommend he monitor BP at least a few mornings a week at home and document.  DASH diet at home.  Continue current medication regimen and adjust as needed.  Labs today: CBC, CMP, urine ALB.  Urine ALB 25 December 2022, continue Lisinopril for kidney protection.  Refills sent in.  Return in 6 months.

## 2022-12-03 NOTE — Assessment & Plan Note (Signed)
Waxing and waning past labs.  No red flags on exam.  Due for colonoscopy next year, denies rectal bleeding.  Recheck CBC, iron, ferritin.  Take multivitamin as currently doing.

## 2022-12-04 LAB — CBC WITH DIFFERENTIAL/PLATELET
Basophils Absolute: 0 10*3/uL (ref 0.0–0.2)
Basos: 1 %
EOS (ABSOLUTE): 0.2 10*3/uL (ref 0.0–0.4)
Eos: 3 %
Hematocrit: 39.2 % (ref 37.5–51.0)
Hemoglobin: 13.5 g/dL (ref 13.0–17.7)
Immature Grans (Abs): 0 10*3/uL (ref 0.0–0.1)
Immature Granulocytes: 0 %
Lymphocytes Absolute: 2.1 10*3/uL (ref 0.7–3.1)
Lymphs: 28 %
MCH: 32.5 pg (ref 26.6–33.0)
MCHC: 34.4 g/dL (ref 31.5–35.7)
MCV: 95 fL (ref 79–97)
Monocytes Absolute: 0.5 10*3/uL (ref 0.1–0.9)
Monocytes: 7 %
Neutrophils Absolute: 4.7 10*3/uL (ref 1.4–7.0)
Neutrophils: 61 %
Platelets: 248 10*3/uL (ref 150–450)
RBC: 4.15 x10E6/uL (ref 4.14–5.80)
RDW: 13.1 % (ref 11.6–15.4)
WBC: 7.6 10*3/uL (ref 3.4–10.8)

## 2022-12-04 LAB — LIPID PANEL W/O CHOL/HDL RATIO
Cholesterol, Total: 138 mg/dL (ref 100–199)
HDL: 51 mg/dL (ref >39)
LDL Chol Calc (NIH): 62 mg/dL (ref 0–99)
Triglycerides: 143 mg/dL (ref 0–149)
VLDL Cholesterol Cal: 25 mg/dL (ref 5–40)

## 2022-12-04 LAB — IRON AND TIBC
Iron Saturation: 21 % (ref 15–55)
Iron: 67 ug/dL (ref 38–169)
Total Iron Binding Capacity: 317 ug/dL (ref 250–450)
UIBC: 250 ug/dL (ref 111–343)

## 2022-12-04 LAB — COMPREHENSIVE METABOLIC PANEL
ALT: 28 IU/L (ref 0–44)
AST: 22 IU/L (ref 0–40)
Albumin/Globulin Ratio: 1.9 (ref 1.2–2.2)
Albumin: 4.3 g/dL (ref 3.8–4.8)
Alkaline Phosphatase: 49 IU/L (ref 44–121)
BUN/Creatinine Ratio: 14 (ref 10–24)
BUN: 15 mg/dL (ref 8–27)
Bilirubin Total: 0.5 mg/dL (ref 0.0–1.2)
CO2: 22 mmol/L (ref 20–29)
Calcium: 10 mg/dL (ref 8.6–10.2)
Chloride: 105 mmol/L (ref 96–106)
Creatinine, Ser: 1.08 mg/dL (ref 0.76–1.27)
Globulin, Total: 2.3 g/dL (ref 1.5–4.5)
Glucose: 96 mg/dL (ref 70–99)
Potassium: 4.5 mmol/L (ref 3.5–5.2)
Sodium: 142 mmol/L (ref 134–144)
Total Protein: 6.6 g/dL (ref 6.0–8.5)
eGFR: 72 mL/min/{1.73_m2} (ref >59)

## 2022-12-04 LAB — FERRITIN: Ferritin: 197 ng/mL (ref 30–400)

## 2022-12-04 NOTE — Progress Notes (Signed)
Contacted via MyChart   Good afternoon Ronny, your labs have returned and overall these look great.  No medication changes needed.  Great job!!  Any questions? Keep being amazing!!  Thank you for allowing me to participate in your care.  I appreciate you. Kindest regards, Eriyana Sweeten

## 2022-12-05 DIAGNOSIS — K080 Exfoliation of teeth due to systemic causes: Secondary | ICD-10-CM | POA: Diagnosis not present

## 2022-12-16 IMAGING — DX DG LUMBAR SPINE COMPLETE 4+V
5 series · 5 of 5 positions shown · non-contrast
Comparison: None.

CLINICAL DATA: Back pain, left flank pain

EXAM:
LUMBAR SPINE - COMPLETE 4+ VIEW

[l-spine ap]
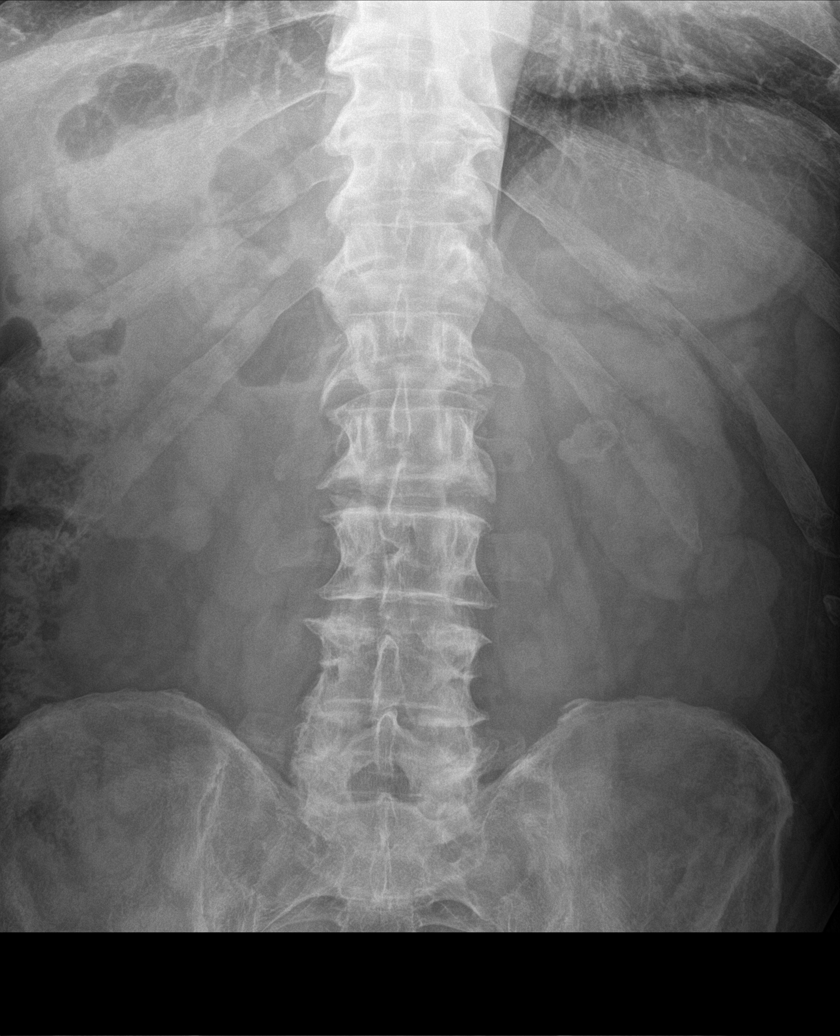

[l-spine obl (1 of 2)]
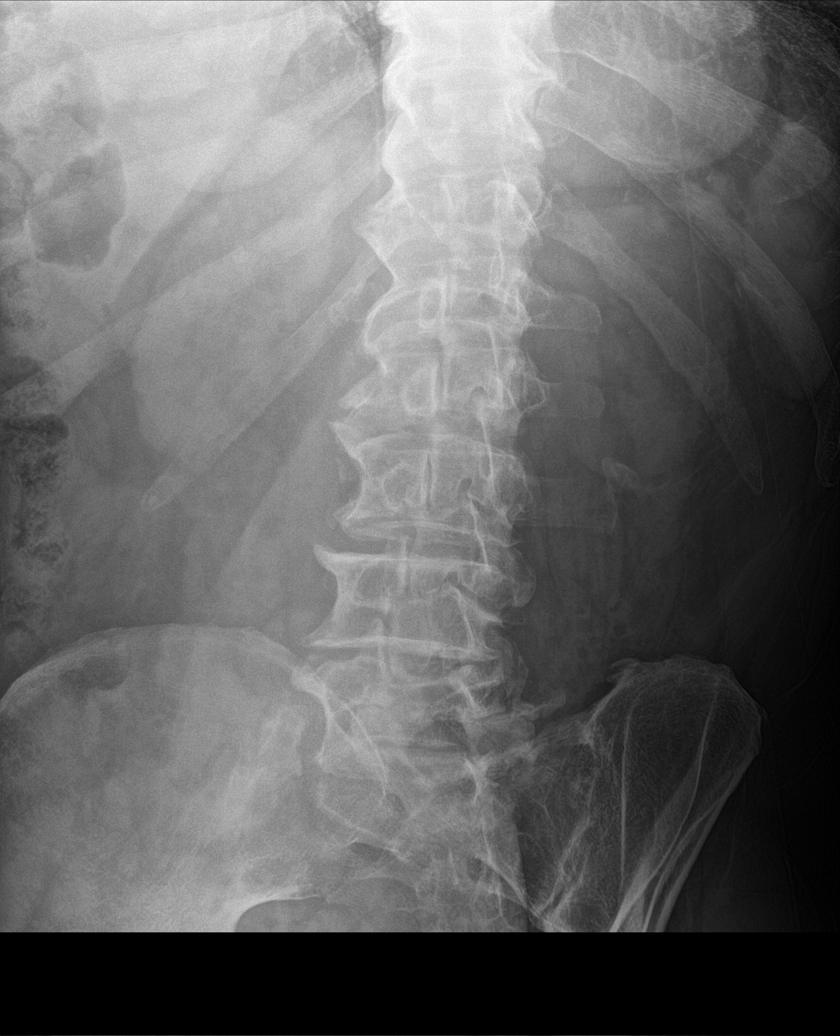

[l-spine obl (2 of 2)]
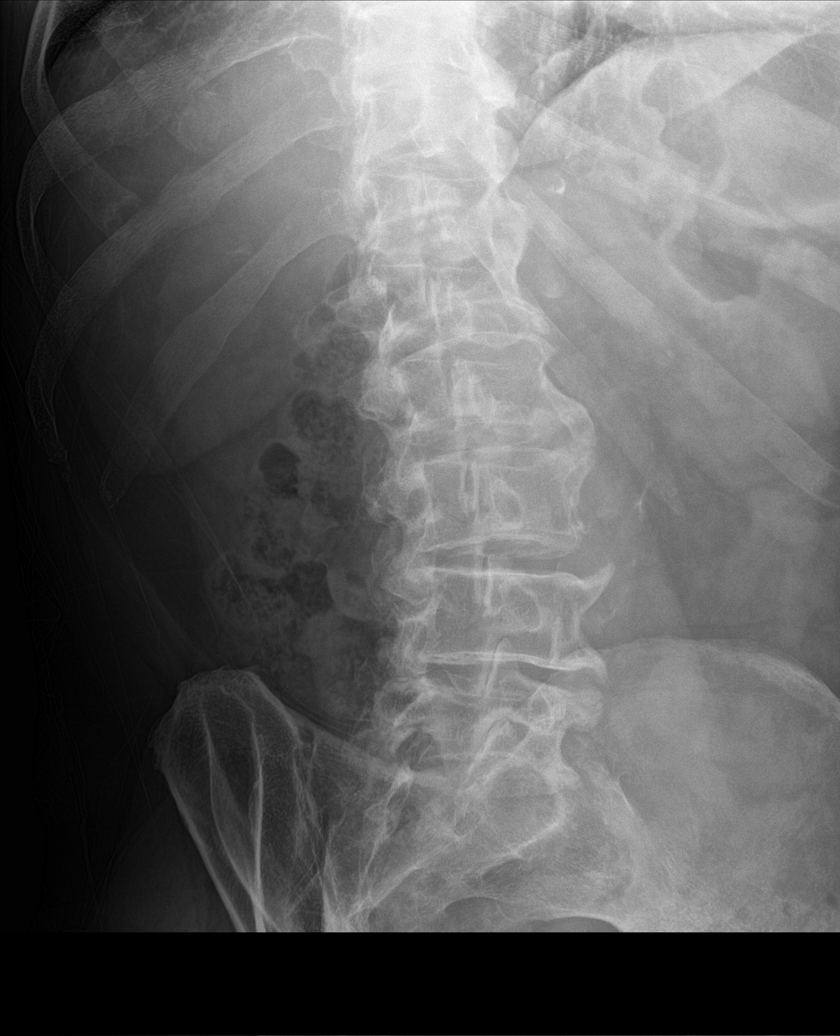

[l-spine lat]
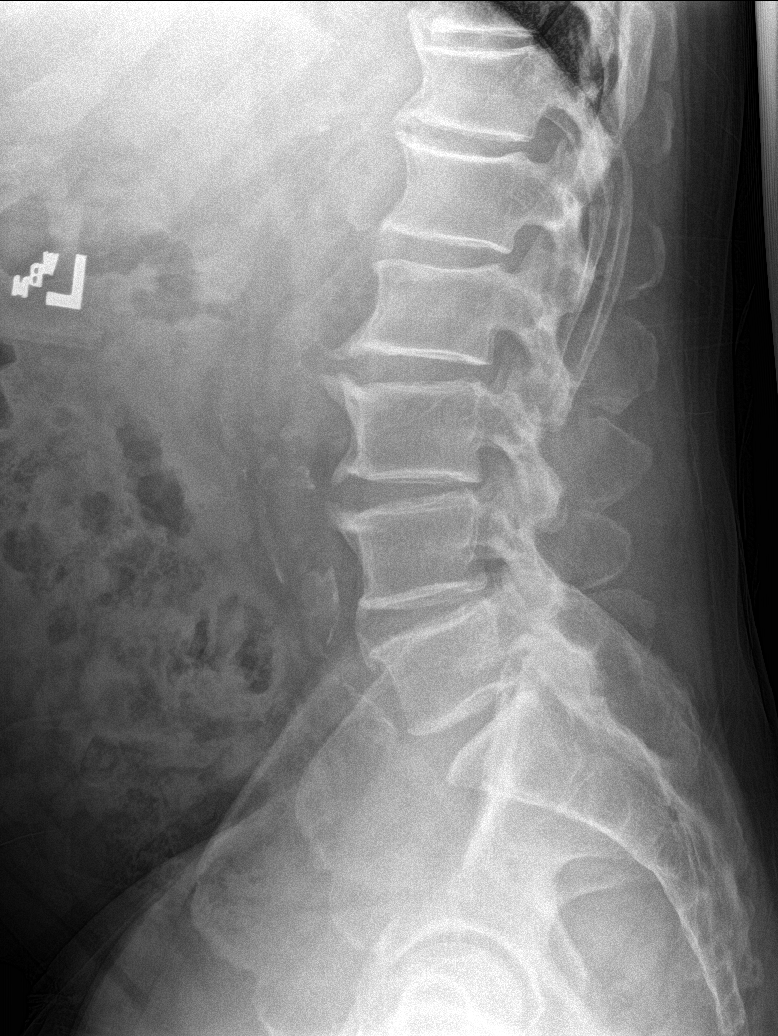

[l-spine spot]
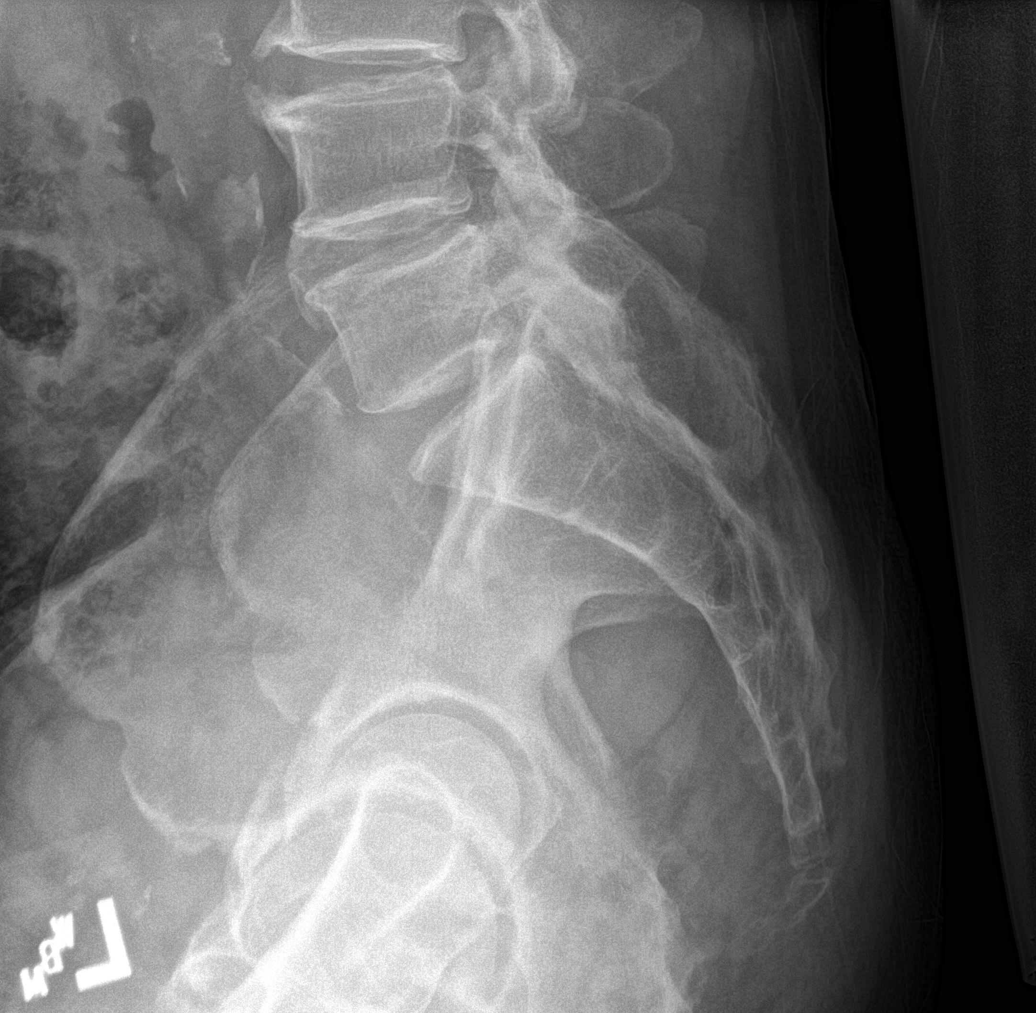

[5 of 5 positions shown; findings below may reference images not displayed]

FINDINGS: No recent fracture is seen. There is minimal retrolisthesis at L3-L4
level. Degenerative changes are noted with bony spurs and facet
hypertrophy. Vascular calcifications are seen in aorta and its major
branches.
IMPRESSION: No recent fracture is seen. Lumbar spondylosis. There is minimal
retrolisthesis at L3-L4 level.

## 2023-06-04 ENCOUNTER — Ambulatory Visit: Payer: Medicare Other | Admitting: Nurse Practitioner

## 2023-06-25 ENCOUNTER — Encounter: Payer: Self-pay | Admitting: Nurse Practitioner

## 2023-06-25 ENCOUNTER — Ambulatory Visit (INDEPENDENT_AMBULATORY_CARE_PROVIDER_SITE_OTHER): Payer: Medicare Other | Admitting: Nurse Practitioner

## 2023-06-25 VITALS — BP 106/57 | HR 61 | Temp 97.9°F | Ht 70.0 in | Wt 187.8 lb

## 2023-06-25 DIAGNOSIS — D649 Anemia, unspecified: Secondary | ICD-10-CM

## 2023-06-25 DIAGNOSIS — I358 Other nonrheumatic aortic valve disorders: Secondary | ICD-10-CM

## 2023-06-25 DIAGNOSIS — Z6828 Body mass index (BMI) 28.0-28.9, adult: Secondary | ICD-10-CM | POA: Diagnosis not present

## 2023-06-25 DIAGNOSIS — E782 Mixed hyperlipidemia: Secondary | ICD-10-CM

## 2023-06-25 DIAGNOSIS — I25118 Atherosclerotic heart disease of native coronary artery with other forms of angina pectoris: Secondary | ICD-10-CM

## 2023-06-25 DIAGNOSIS — Z23 Encounter for immunization: Secondary | ICD-10-CM

## 2023-06-25 DIAGNOSIS — I10 Essential (primary) hypertension: Secondary | ICD-10-CM | POA: Diagnosis not present

## 2023-06-25 NOTE — Assessment & Plan Note (Signed)
Waxing and waning past labs, recent were stable and in normal ranges.  No red flags on exam.  Due for colonoscopy next year, denies rectal bleeding.  Recheck labs next visit  Take multivitamin as currently doing.

## 2023-06-25 NOTE — Assessment & Plan Note (Signed)
Chronic, ongoing.  Continue current medication regimen and adjust as needed. Lipid panel today. 

## 2023-06-25 NOTE — Assessment & Plan Note (Signed)
Recommended eating smaller high protein, low fat meals more frequently and exercising 30 mins a day 5 times a week with a goal of 10-15lb weight loss in the next 3 months. Patient voiced their understanding and motivation to adhere to these recommendations.  

## 2023-06-25 NOTE — Assessment & Plan Note (Signed)
Chronic, stable.  No recent CP.  Continue current medication regimen and collaboration with cardiology.  Continue Plavix, history of stents.

## 2023-06-25 NOTE — Assessment & Plan Note (Signed)
Chronic, ongoing.  Continue collaboration with cardiology and current medications.  He will schedule with cardiology.

## 2023-06-25 NOTE — Assessment & Plan Note (Signed)
Chronic, ongoing.  BP at goal in office and on home readings on occasion when he checks. Recommend he monitor BP at least a few mornings a week at home and document.  DASH diet at home.  Continue current medication regimen and adjust as needed.  Labs today: CMP.  Urine ALB 25 December 2022, continue Lisinopril for kidney protection.  Return in 6 months.

## 2023-06-25 NOTE — Progress Notes (Signed)
BP (!) 106/57   Pulse 61   Temp 97.9 F (36.6 C) (Oral)   Ht 5\' 10"  (1.778 m)   Wt 187 lb 12.8 oz (85.2 kg)   SpO2 97%   BMI 26.95 kg/m    Subjective:    Patient ID: Mark Quinn, male    DOB: Dec 26, 1945, 77 y.o.   MRN: [id number]  HPI: Mark Quinn is a 77 y.o. male  Chief Complaint  Patient presents with   Anemia   Hyperlipidemia   Hypertension   HYPERTENSION / HYPERLIPIDEMIA Continues on Lisinopril, Metoprolol, Rosuvastatin, Zetia, Plavix.  Last saw cardiology 12/26/21, he needs to make follow-up appointment.  History of stents placed 15 years ago. Satisfied with current treatment? yes Duration of hypertension: chronic BP monitoring frequency: monthly BP range: <130/80 on average BP medication side effects: no Duration of hyperlipidemia: chronic Cholesterol medication side effects: no Cholesterol supplements: none Medication compliance: good compliance Aspirin: plavix Recent stressors: no Recurrent headaches: no Visual changes: no Palpitations: no Dyspnea: no Chest pain: no Lower extremity edema: no Dizzy/lightheaded: no  ANEMIA Noticed on past labs, waxes and wanes. Recent were stable. Anemia status: stable Etiology of anemia: unknown Duration of anemia treatment: on and off Severity of anemia: mild Fatigue: no Decreased exercise tolerance: no  Dyspnea on exertion: no Palpitations: no Bleeding: no Pica: no   Relevant past medical, surgical, family and social history reviewed and updated as indicated. Interim medical history since our last visit reviewed. Allergies and medications reviewed and updated.  Review of Systems  Constitutional:  Negative for activity change, diaphoresis, fatigue and fever.  Respiratory:  Negative for cough, chest tightness, shortness of breath and wheezing.   Cardiovascular:  Negative for chest pain, palpitations and leg swelling.  Gastrointestinal: Negative.   Neurological: Negative.   Psychiatric/Behavioral:  Negative.      Per HPI unless specifically indicated above     Objective:    BP (!) 106/57   Pulse 61   Temp 97.9 F (36.6 C) (Oral)   Ht 5\' 10"  (1.778 m)   Wt 187 lb 12.8 oz (85.2 kg)   SpO2 97%   BMI 26.95 kg/m   Wt Readings from Last 3 Encounters:  06/25/23 187 lb 12.8 oz (85.2 kg)  12/03/22 186 lb 1.6 oz (84.4 kg)  05/31/22 195 lb 3.2 oz (88.5 kg)    Physical Exam Vitals and nursing note reviewed.  Constitutional:      General: He is awake. He is not in acute distress.    Appearance: He is well-developed and well-groomed. He is not ill-appearing or toxic-appearing.  HENT:     Head: Normocephalic.     Right Ear: Hearing and external ear normal.     Left Ear: Hearing and external ear normal.  Eyes:     General: Lids are normal.     Extraocular Movements: Extraocular movements intact.     Conjunctiva/sclera: Conjunctivae normal.  Neck:     Thyroid: No thyromegaly.     Vascular: No carotid bruit.  Cardiovascular:     Rate and Rhythm: Normal rate and regular rhythm.     Heart sounds: Murmur heard.     Systolic murmur is present with a grade of 2/6.     No gallop.  Pulmonary:     Effort: No accessory muscle usage or respiratory distress.     Breath sounds: Normal breath sounds.  Abdominal:     General: Bowel sounds are normal. There is no distension.  Palpations: Abdomen is soft.     Tenderness: There is no abdominal tenderness.  Musculoskeletal:     Cervical back: Full passive range of motion without pain.     Right lower leg: No edema.     Left lower leg: No edema.  Lymphadenopathy:     Cervical: No cervical adenopathy.  Skin:    General: Skin is warm.     Capillary Refill: Capillary refill takes less than 2 seconds.  Neurological:     Mental Status: He is alert and oriented to person, place, and time.     Deep Tendon Reflexes: Reflexes are normal and symmetric.     Reflex Scores:      Brachioradialis reflexes are 2+ on the right side and 2+ on the  left side.      Patellar reflexes are 2+ on the right side and 2+ on the left side. Psychiatric:        Attention and Perception: Attention normal.        Mood and Affect: Mood normal.        Speech: Speech normal.        Behavior: Behavior normal. Behavior is cooperative.        Thought Content: Thought content normal.    Results for orders placed or performed in visit on 12/03/22  Microalbumin, Urine Waived  Result Value Ref Range   Microalb, Ur Waived 30 (H) 0 - 19 mg/L   Creatinine, Urine Waived 100 10 - 300 mg/dL   Microalb/Creat Ratio 30-300 (H) <30 mg/g  CBC with Differential/Platelet  Result Value Ref Range   WBC 7.6 3.4 - 10.8 x10E3/uL   RBC 4.15 4.14 - 5.80 x10E6/uL   Hemoglobin 13.5 13.0 - 17.7 g/dL   Hematocrit 09.8 11.9 - 51.0 %   MCV 95 79 - 97 fL   MCH 32.5 26.6 - 33.0 pg   MCHC 34.4 31.5 - 35.7 g/dL   RDW 14.7 82.9 - 56.2 %   Platelets 248 150 - 450 x10E3/uL   Neutrophils 61 Not Estab. %   Lymphs 28 Not Estab. %   Monocytes 7 Not Estab. %   Eos 3 Not Estab. %   Basos 1 Not Estab. %   Neutrophils Absolute 4.7 1.4 - 7.0 x10E3/uL   Lymphocytes Absolute 2.1 0.7 - 3.1 x10E3/uL   Monocytes Absolute 0.5 0.1 - 0.9 x10E3/uL   EOS (ABSOLUTE) 0.2 0.0 - 0.4 x10E3/uL   Basophils Absolute 0.0 0.0 - 0.2 x10E3/uL   Immature Granulocytes 0 Not Estab. %   Immature Grans (Abs) 0.0 0.0 - 0.1 x10E3/uL  Comprehensive metabolic panel  Result Value Ref Range   Glucose 96 70 - 99 mg/dL   BUN 15 8 - 27 mg/dL   Creatinine, Ser 1.30 0.76 - 1.27 mg/dL   eGFR 72 >86 VH/QIO/9.62   BUN/Creatinine Ratio 14 10 - 24   Sodium 142 134 - 144 mmol/L   Potassium 4.5 3.5 - 5.2 mmol/L   Chloride 105 96 - 106 mmol/L   CO2 22 20 - 29 mmol/L   Calcium 10.0 8.6 - 10.2 mg/dL   Total Protein 6.6 6.0 - 8.5 g/dL   Albumin 4.3 3.8 - 4.8 g/dL   Globulin, Total 2.3 1.5 - 4.5 g/dL   Albumin/Globulin Ratio 1.9 1.2 - 2.2   Bilirubin Total 0.5 0.0 - 1.2 mg/dL   Alkaline Phosphatase 49 44 - 121 IU/L    AST 22 0 - 40 IU/L   ALT 28 0 - 44  IU/L  Lipid Panel w/o Chol/HDL Ratio  Result Value Ref Range   Cholesterol, Total 138 100 - 199 mg/dL   Triglycerides 253 0 - 149 mg/dL   HDL 51 >66 mg/dL   VLDL Cholesterol Cal 25 5 - 40 mg/dL   LDL Chol Calc (NIH) 62 0 - 99 mg/dL  Iron Binding Cap (TIBC)([facility])  Result Value Ref Range   Total Iron Binding Capacity 317 250 - 450 ug/dL   UIBC 440 347 - 425 ug/dL   Iron 67 38 - 956 ug/dL   Iron Saturation 21 15 - 55 %  Ferritin  Result Value Ref Range   Ferritin 197 30 - 400 ng/mL      Assessment & Plan:   Problem List Items Addressed This Visit       Cardiovascular and Mediastinum   Aortic valve sclerosis    Chronic, ongoing.  Continue collaboration with cardiology and current medications.  He will schedule with cardiology.      CAD (coronary artery disease) - Primary    Chronic, stable.  No recent CP.  Continue current medication regimen and collaboration with cardiology.  Continue Plavix, history of stents.      Essential hypertension, benign    Chronic, ongoing.  BP at goal in office and on home readings on occasion when he checks. Recommend he monitor BP at least a few mornings a week at home and document.  DASH diet at home.  Continue current medication regimen and adjust as needed.  Labs today: CMP.  Urine ALB 25 December 2022, continue Lisinopril for kidney protection.  Return in 6 months.         Other   BMI 28.0-28.9,adult    Recommended eating smaller high protein, low fat meals more frequently and exercising 30 mins a day 5 times a week with a goal of 10-15lb weight loss in the next 3 months. Patient voiced their understanding and motivation to adhere to these recommendations.       Hyperlipemia    Chronic, ongoing.  Continue current medication regimen and adjust as needed.  Lipid panel today.         Relevant Orders   Comprehensive metabolic panel   Lipid Panel w/o Chol/HDL Ratio   Low hemoglobin    Waxing  and waning past labs, recent were stable and in normal ranges.  No red flags on exam.  Due for colonoscopy next year, denies rectal bleeding.  Recheck labs next visit  Take multivitamin as currently doing.        Follow up plan: Return in about 6 months (around 12/24/2023) for Annual Physical after 12/03/23.

## 2023-06-25 NOTE — Patient Instructions (Signed)

## 2023-06-26 LAB — COMPREHENSIVE METABOLIC PANEL
ALT: 32 [IU]/L (ref 0–44)
AST: 38 [IU]/L (ref 0–40)
Albumin: 4.1 g/dL (ref 3.8–4.8)
Alkaline Phosphatase: 48 [IU]/L (ref 44–121)
BUN/Creatinine Ratio: 17 (ref 10–24)
BUN: 23 mg/dL (ref 8–27)
Bilirubin Total: 0.4 mg/dL (ref 0.0–1.2)
CO2: 21 mmol/L (ref 20–29)
Calcium: 9.4 mg/dL (ref 8.6–10.2)
Chloride: 104 mmol/L (ref 96–106)
Creatinine, Ser: 1.37 mg/dL — ABNORMAL HIGH (ref 0.76–1.27)
Globulin, Total: 2.1 g/dL (ref 1.5–4.5)
Glucose: 93 mg/dL (ref 70–99)
Potassium: 4.5 mmol/L (ref 3.5–5.2)
Sodium: 139 mmol/L (ref 134–144)
Total Protein: 6.2 g/dL (ref 6.0–8.5)
eGFR: 54 mL/min/{1.73_m2} — ABNORMAL LOW (ref >59)

## 2023-06-26 LAB — LIPID PANEL W/O CHOL/HDL RATIO
Cholesterol, Total: 120 mg/dL (ref 100–199)
HDL: 44 mg/dL (ref >39)
LDL Chol Calc (NIH): 43 mg/dL (ref 0–99)
Triglycerides: 205 mg/dL — ABNORMAL HIGH (ref 0–149)
VLDL Cholesterol Cal: 33 mg/dL (ref 5–40)

## 2023-06-26 NOTE — Progress Notes (Signed)
Contacted via MyChart   Good afternoon Ronny, your labs have returned: - Kidney function shows some drop in levels.  We like eGFR above 60.  Ensure you are getting plenty of fluid (water) at home and we will recheck next visit.  Liver function, AST and ALT, are normal. - LDL is stable at 43, continue medications.  Any questions? Keep being amazing!!  Thank you for allowing me to participate in your care.  I appreciate you. Kindest regards, Hani Campusano

## 2023-07-01 DIAGNOSIS — H5203 Hypermetropia, bilateral: Secondary | ICD-10-CM | POA: Diagnosis not present

## 2023-07-01 DIAGNOSIS — H35371 Puckering of macula, right eye: Secondary | ICD-10-CM | POA: Diagnosis not present

## 2023-07-01 DIAGNOSIS — H2513 Age-related nuclear cataract, bilateral: Secondary | ICD-10-CM | POA: Diagnosis not present

## 2023-07-01 DIAGNOSIS — H52223 Regular astigmatism, bilateral: Secondary | ICD-10-CM | POA: Diagnosis not present

## 2023-08-08 ENCOUNTER — Ambulatory Visit: Payer: Medicare Other | Admitting: Medical

## 2023-09-04 DIAGNOSIS — K080 Exfoliation of teeth due to systemic causes: Secondary | ICD-10-CM | POA: Diagnosis not present

## 2023-09-08 NOTE — Progress Notes (Deleted)
   Cardiology Clinic Note   Date: 09/08/2023 ID: [name], DOB 11-04-1945, MRN [id number]  Primary Cardiologist:  None  Patient Profile    Mark Quinn is a 78 y.o. male who presents to the clinic today for ***    Past medical history significant for: CAD. LHC 12/07/2004 performed at [facility]: Proximal RCA 95%.  Ramus intermedius 95%.  PCI with DES x 2 (3.5 x 23 and 3.5 x 13 mm) to proximal RCA, DES 4 x 12 mm to ramus. Echo 03/20/2018: EF 60 to 65%.  No RWMA.  Very mild AS.  Mild MR.  Mild LAE.  Normal RV function, normal PA pressure. Hypertension. Hyperlipidemia. Lipid panel on 03/25/2023: LDL 43, HDL 44, TG 205, total 120. GERD. Gout.  In summary, patient was initially evaluated by Dr. Gollan on 03/06/2018 to establish care for CAD at the request of Dr. Montell.  Patient reported not being seen by cardiology for 15 years.  He was referred by Dr. Montell for heart murmur.  He underwent echo which showed normal LV/RV function with very mild AS and mild MR.     History of Present Illness    Mark Quinn is followed by Dr. Gollan for the above outlined history.  Patient was last seen in the office by Dr. Gollan on 12/26/2021 for routine follow-up.  He was doing well at that time.  He was changed to Crestor  and Zetia  was added.  Today, patient ***  CAD S/p DES x 2 to proximal RCA and DES to ramus April 2006 performed at [facility].  Patient*** -Continue Plavix , Toprol , rosuvastatin , Zetia .  Hypertension BP today*** -Continue lisinopril , Toprol .  Hyperlipidemia LDL July 2024 43, at goal. -Continue rosuvastatin  and Zetia .   ROS: All other systems reviewed and are otherwise negative except as noted in History of Present Illness.  EKGs/Labs Reviewed        06/25/2023: ALT 32; AST 38; BUN 23; Creatinine, Ser 1.37; Potassium 4.5; Sodium 139   12/03/2022: Hemoglobin 13.5; WBC 7.6   No results found for requested labs within last 365 days.   No results found for  requested labs within last 365 days.  ***  Risk Assessment/Calculations    {Does this patient have ATRIAL FIBRILLATION?:[phone]} No BP recorded.  {Refresh Note OR Click here to enter BP  :1}***        Physical Exam    VS:  There were no vitals taken for this visit. , BMI There is no height or weight on file to calculate BMI.  GEN: Well nourished, well developed, in no acute distress. Neck: No JVD or carotid bruits. Cardiac: *** RRR. No murmurs. No rubs or gallops.   Respiratory:  Respirations regular and unlabored. Clear to auscultation without rales, wheezing or rhonchi. GI: Soft, nontender, nondistended. Extremities: Radials/DP/PT 2+ and equal bilaterally. No clubbing or cyanosis. No edema ***  Skin: Warm and dry, no rash. Neuro: Strength intact.  Assessment & Plan   ***  Disposition: ***     {Are you ordering a CV Procedure (e.g. stress test, cath, DCCV, TEE, etc)?   Press F2        :[id number]}   Signed, Barnie HERO. Jesper Stirewalt, DNP, NP-C

## 2023-09-10 ENCOUNTER — Ambulatory Visit: Payer: Medicare Other | Admitting: Student

## 2023-09-12 DIAGNOSIS — K080 Exfoliation of teeth due to systemic causes: Secondary | ICD-10-CM | POA: Diagnosis not present

## 2023-09-19 NOTE — Progress Notes (Signed)
Cardiology Clinic Note   Date: 09/24/2023 ID: SHERWIN HOLLINGSHED, DOB [birth year], MRN [id number]  Primary Cardiologist:  Julien Nordmann, MD  Patient Profile    Mark Quinn is a 78 y.o. male who presents to the clinic today for over due routine follow up.     Past medical history significant for: CAD. LHC 12/07/2004 performed at [facility]: Proximal RCA 95%.  Ramus intermedius 95%.  PCI with DES x 2 (3.5 x 23 and 3.5 x 13 mm) to proximal RCA, DES 4 x 12 mm to ramus. Echo 03/20/2018: EF 60 to 65%.  No RWMA.  Very mild AS.  Mild MR.  Mild LAE.  Normal RV function, normal PA pressure. Hypertension. Hyperlipidemia. Lipid panel on 03/25/2023: LDL 43, HDL 44, TG 205, total 120. GERD. Gout.  In summary, patient was initially evaluated by Dr. Mariah Milling on 03/06/2018 to establish care for CAD at the request of Dr. Dossie Arbour.  Patient reported not being seen by cardiology for 15 years.  He was referred by Dr. Dossie Arbour for heart murmur.  He underwent echo which showed normal LV/RV function with very mild AS and mild MR.     History of Present Illness    Mark Quinn is followed by Dr. Mariah Milling for the above outlined history.  Patient was last seen in the office by Dr. Mariah Milling on 12/26/2021 for routine follow-up.  He was doing well at that time.  He was changed to Crestor and Zetia was added.  Today, patient reports he is doing well. Patient denies shortness of breath, lower extremity edema, orthopnea or PND. No chest pain, pressure, or tightness. No palpitations.  He reports dyspnea with heavier exertion but none with routine activities. 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      ROS: All other systems reviewed and are otherwise negative except as noted in History of Present Illness.  EKGs/Labs Reviewed    EKG Interpretation Date/Time:  Tuesday September 24 2023 15:07:50 EST Ventricular Rate:  63 PR  Interval:  246 QRS Duration:  102 QT Interval:  418 QTC Calculation: 427 R Axis:   11  Text Interpretation: Sinus rhythm with 1st degree A-V block with Premature atrial complexes with Abberant conduction When compared with ECG of 12/26/2021 no significant change Confirmed by Carlos Levering [id number]) on 09/24/2023 3:29:58 PM   06/25/2023: ALT 32; AST 38; BUN 23; Creatinine, Ser 1.37; Potassium 4.5; Sodium 139   12/03/2022: Hemoglobin 13.5; WBC 7.6           Physical Exam    VS:  BP 110/80 (Cuff Size: Normal)   Pulse 63   Ht 5\' 10"  (1.778 m)   Wt 183 lb 9.6 oz (83.3 kg)   SpO2 95%   BMI 26.34 kg/m  , BMI Body mass index is 26.34 kg/m.  GEN: Well nourished, well developed, in no acute distress. Neck: No JVD or carotid bruits. Cardiac:  RRR. 2/6 systolic murmur. No rubs or gallops.   Respiratory:  Respirations regular and unlabored. Clear to auscultation without rales, wheezing or rhonchi. GI: Soft, nontender, nondistended. Extremities: Radials/DP/PT 2+ and equal bilaterally. No clubbing or cyanosis. No edema.  Skin: Warm and dry, no rash. Neuro: Strength intact.  Assessment & Plan   CAD S/p DES x 2 to proximal RCA and DES to ramus April 2006 performed at [facility].  Patient denies chest pain, pressure or tightness.  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  -Continue Plavix, Toprol, rosuvastatin, Zetia.  Cardiac murmur  Patient reports he has been told since he was a teenager that he had a cardiac murmur. Echo July 2019 showed Very mild AS and mild MR. He denies lower extremity edema, shortness of breath, DOE with routine activities. 1/6 systolic murmur on exam.   Hypertension BP today 110/80. No headaches or dizziness.  -Continue lisinopril, Toprol.  Hyperlipidemia LDL July 2024 43, at goal. -Continue rosuvastatin and Zetia.  Disposition: Return in 1 year or sooner as needed.          Signed, Etta Grandchild. Jahsiah Carpenter, DNP, NP-C

## 2023-09-24 ENCOUNTER — Encounter: Payer: Self-pay | Admitting: Student

## 2023-09-24 ENCOUNTER — Ambulatory Visit: Payer: Medicare Other | Attending: Student | Admitting: Student

## 2023-09-24 VITALS — BP 110/80 | HR 63 | Ht 70.0 in | Wt 183.6 lb

## 2023-09-24 DIAGNOSIS — E785 Hyperlipidemia, unspecified: Secondary | ICD-10-CM

## 2023-09-24 DIAGNOSIS — I10 Essential (primary) hypertension: Secondary | ICD-10-CM | POA: Diagnosis not present

## 2023-09-24 DIAGNOSIS — R011 Cardiac murmur, unspecified: Secondary | ICD-10-CM

## 2023-09-24 DIAGNOSIS — I2510 Atherosclerotic heart disease of native coronary artery without angina pectoris: Secondary | ICD-10-CM

## 2023-09-24 NOTE — Patient Instructions (Addendum)
Medication Instructions:  Your Physician recommend you continue on your current medication as directed.    *If you need a refill on your cardiac medications before your next appointment, please call your pharmacy*   Lab Work: None ordered at this time    Follow-Up: At [facility], you and your health needs are our priority.  As part of our continuing mission to provide you with exceptional heart care, we have created designated Provider Care Teams.  These Care Teams include your primary Cardiologist (physician) and Advanced Practice Providers (APPs -  Physician Assistants and Nurse Practitioners) who all work together to provide you with the care you need, when you need it.    Your next appointment:   1 year(s)  Provider:   You may see Julien Nordmann, MD or one of the following Advanced Practice Providers on your designated Care Team:   Nicolasa Ducking, NP Eula Listen, PA-C Cadence Fransico Michael, PA-C Charlsie Quest, NP  Carlos Levering, NP

## 2023-11-05 ENCOUNTER — Encounter: Payer: Self-pay | Admitting: Nurse Practitioner

## 2023-11-05 ENCOUNTER — Ambulatory Visit (INDEPENDENT_AMBULATORY_CARE_PROVIDER_SITE_OTHER): Admitting: Nurse Practitioner

## 2023-11-05 VITALS — BP 97/59 | HR 72 | Ht 70.0 in | Wt 183.0 lb

## 2023-11-05 DIAGNOSIS — R1909 Other intra-abdominal and pelvic swelling, mass and lump: Secondary | ICD-10-CM

## 2023-11-05 NOTE — Progress Notes (Signed)
 BP (!) 97/59 (BP Location: Left Arm, Patient Position: Sitting, Cuff Size: Large)   Pulse 72   Ht 5\' 10"  (1.778 m)   Wt 183 lb (83 kg)   SpO2 94%   BMI 26.26 kg/m    Subjective:    Patient ID: Mark Quinn, male    DOB: October 11, 1945, 78 y.o.   MRN: [id number]  HPI: Mark Quinn is a 78 y.o. male  Chief Complaint  Patient presents with   Cyst    Groin area, right side, appeared out of nowhere after coughing spell, mhas been present for 2-3 weeks, starting to swell    Patient states he has something in his right groin area.  He had a coughing spell a couple of weeks ago and it was painful then.  He stands on his feet a lot and that causes it to poke out more.  States when he gets up in the morning it isn't as puffy then in the afternoon it is more pronounced.       Relevant past medical, surgical, family and social history reviewed and updated as indicated. Interim medical history since our last visit reviewed. Allergies and medications reviewed and updated.  Review of Systems  Skin:        Lump in groin area    Per HPI unless specifically indicated above     Objective:    BP (!) 97/59 (BP Location: Left Arm, Patient Position: Sitting, Cuff Size: Large)   Pulse 72   Ht 5\' 10"  (1.778 m)   Wt 183 lb (83 kg)   SpO2 94%   BMI 26.26 kg/m   Wt Readings from Last 3 Encounters:  11/05/23 183 lb (83 kg)  09/24/23 183 lb 9.6 oz (83.3 kg)  06/25/23 187 lb 12.8 oz (85.2 kg)    Physical Exam Vitals and nursing note reviewed.  Constitutional:      General: He is not in acute distress.    Appearance: Normal appearance. He is not ill-appearing, toxic-appearing or diaphoretic.  HENT:     Head: Normocephalic.     Right Ear: External ear normal.     Left Ear: External ear normal.     Nose: Nose normal. No congestion or rhinorrhea.     Mouth/Throat:     Mouth: Mucous membranes are moist.  Eyes:     General:        Right eye: No discharge.        Left eye: No  discharge.     Extraocular Movements: Extraocular movements intact.     Conjunctiva/sclera: Conjunctivae normal.     Pupils: Pupils are equal, round, and reactive to light.  Cardiovascular:     Rate and Rhythm: Normal rate and regular rhythm.     Heart sounds: No murmur heard. Pulmonary:     Effort: Pulmonary effort is normal. No respiratory distress.     Breath sounds: Normal breath sounds. No wheezing, rhonchi or rales.  Abdominal:     General: Abdomen is flat. Bowel sounds are normal.  Genitourinary:   Musculoskeletal:     Cervical back: Normal range of motion and neck supple.  Skin:    General: Skin is warm and dry.     Capillary Refill: Capillary refill takes less than 2 seconds.  Neurological:     General: No focal deficit present.     Mental Status: He is alert and oriented to person, place, and time.  Psychiatric:  Mood and Affect: Mood normal.        Behavior: Behavior normal.        Thought Content: Thought content normal.        Judgment: Judgment normal.     Results for orders placed or performed in visit on 06/25/23  Comprehensive metabolic panel   Collection Time: 06/25/23  3:33 PM  Result Value Ref Range   Glucose 93 70 - 99 mg/dL   BUN 23 8 - 27 mg/dL   Creatinine, Ser 1.61 (H) 0.76 - 1.27 mg/dL   eGFR 54 (L) >09 UE/AVW/0.98   BUN/Creatinine Ratio 17 10 - 24   Sodium 139 134 - 144 mmol/L   Potassium 4.5 3.5 - 5.2 mmol/L   Chloride 104 96 - 106 mmol/L   CO2 21 20 - 29 mmol/L   Calcium 9.4 8.6 - 10.2 mg/dL   Total Protein 6.2 6.0 - 8.5 g/dL   Albumin 4.1 3.8 - 4.8 g/dL   Globulin, Total 2.1 1.5 - 4.5 g/dL   Bilirubin Total 0.4 0.0 - 1.2 mg/dL   Alkaline Phosphatase 48 44 - 121 IU/L   AST 38 0 - 40 IU/L   ALT 32 0 - 44 IU/L  Lipid Panel w/o Chol/HDL Ratio   Collection Time: 06/25/23  3:33 PM  Result Value Ref Range   Cholesterol, Total 120 100 - 199 mg/dL   Triglycerides 119 (H) 0 - 149 mg/dL   HDL 44 >14 mg/dL   VLDL Cholesterol Cal 33 5 -  40 mg/dL   LDL Chol Calc (NIH) 43 0 - 99 mg/dL      Assessment & Plan:   Problem List Items Addressed This Visit   None    Follow up plan: No follow-ups on file.

## 2023-11-07 ENCOUNTER — Other Ambulatory Visit: Payer: Self-pay | Admitting: Nurse Practitioner

## 2023-11-07 ENCOUNTER — Ambulatory Visit
Admission: RE | Admit: 2023-11-07 | Discharge: 2023-11-07 | Disposition: A | Discharge status: Home / Self Care | Source: Ambulatory Visit | Attending: Nurse Practitioner | Admitting: Nurse Practitioner

## 2023-11-07 DIAGNOSIS — K4090 Unilateral inguinal hernia, without obstruction or gangrene, not specified as recurrent: Secondary | ICD-10-CM | POA: Diagnosis not present

## 2023-11-07 DIAGNOSIS — R1909 Other intra-abdominal and pelvic swelling, mass and lump: Secondary | ICD-10-CM

## 2023-11-12 ENCOUNTER — Telehealth: Payer: Self-pay

## 2023-11-12 NOTE — Telephone Encounter (Signed)
 Call to patient. We do not have results yet at this time but we will be in touch with patient once we do have them. Can take up to 3 weeks.

## 2023-11-12 NOTE — Telephone Encounter (Signed)
 Copied from CRM [id number]. Topic: General - Inquiry >> Nov 12, 2023 12:20 PM Teressa P wrote: Reason for CRM: pt's wife called asking about the results of the [place] he had last week.  They would like a call back as soon as the resuts come in.

## 2023-11-18 ENCOUNTER — Encounter: Payer: Self-pay | Admitting: Nurse Practitioner

## 2023-11-18 DIAGNOSIS — K4090 Unilateral inguinal hernia, without obstruction or gangrene, not specified as recurrent: Secondary | ICD-10-CM

## 2023-11-25 ENCOUNTER — Ambulatory Visit
Admission: RE | Admit: 2023-11-25 | Discharge: 2023-11-25 | Disposition: A | Discharge status: Home / Self Care | Source: Ambulatory Visit | Attending: Nurse Practitioner | Admitting: Nurse Practitioner

## 2023-11-25 DIAGNOSIS — K4090 Unilateral inguinal hernia, without obstruction or gangrene, not specified as recurrent: Secondary | ICD-10-CM | POA: Diagnosis not present

## 2023-11-25 DIAGNOSIS — I7143 Infrarenal abdominal aortic aneurysm, without rupture: Secondary | ICD-10-CM | POA: Diagnosis not present

## 2023-11-25 DIAGNOSIS — N281 Cyst of kidney, acquired: Secondary | ICD-10-CM | POA: Diagnosis not present

## 2023-11-25 DIAGNOSIS — N2889 Other specified disorders of kidney and ureter: Secondary | ICD-10-CM | POA: Diagnosis not present

## 2023-11-25 MED ORDER — IOHEXOL 300 MG/ML  SOLN
100.0000 mL | Freq: Once | INTRAMUSCULAR | Status: AC | PRN
  Administered 2023-11-25: 100 mL via INTRAVENOUS

## 2023-11-27 ENCOUNTER — Encounter: Payer: Self-pay | Admitting: Nurse Practitioner

## 2023-11-27 DIAGNOSIS — I714 Abdominal aortic aneurysm, without rupture, unspecified: Secondary | ICD-10-CM | POA: Insufficient documentation

## 2023-12-09 ENCOUNTER — Ambulatory Visit: Payer: Self-pay

## 2023-12-09 NOTE — Telephone Encounter (Signed)
 Copied from CRM [id number]. Topic: Clinical - Red Word Triage >> Dec 09, 2023  9:55 AM Everlene Hobby D wrote: Patient has a hernia and it's swelling and he's in pain. Since last being seen he hasn't been feeling good and has been in a lot of pain and it's gotten worse. His wife wants to know if he needs to be seen sooner than December 25, 2023.   Chief Complaint: Right inguinal hernia, more pain and swelling. Symptoms: Above Frequency: March Pertinent Negatives: Patient denies  Disposition: [] ED /[] Urgent Care (no appt availability in office) / [x] Appointment(In office/virtual)/ []  [facility]/ [] Home Care/ [] Refused Recommended Disposition /[] [facility]/ []  Follow-up with PCP Additional Notes: Agrees with appointment.  Reason for Disposition  Previously diagnosed hernia  Answer Assessment - Initial Assessment Questions 1. ONSET:  "When did this first appear?"     March 2. APPEARANCE: "What does it look like?"     More swollen 3. SIZE: "How big is it?" (inches, cm or compare to coins, fruit)     Golf ball 4. LOCATION: "Where exactly is the hernia located?"     Right groin 5. PATTERN: "Does the swelling come and go, or has it been constant since it started?"     Comes and goes 6. PAIN: "Is there any pain?" If Yes, ask: "How bad is it?"  (Scale 1-10; or mild, moderate, severe)    - MILD (1-3): Doesn't interfere with normal activities, abdomen soft and not tender to touch.     - MODERATE (4-7): Interferes with normal activities or awakens from sleep, abdomen tender to touch.     - SEVERE (8-10): Excruciating pain, doubled over, unable to do any normal activities.       Moderate 7. DIAGNOSIS: "Have you been seen by a doctor (or NP/PA) for this?" "Did the doctor diagnose you as having a hernia?"     Yes 8. OTHER SYMPTOMS: "Do you have any other symptoms?" (e.g., fever, abdomen pain, vomiting)     Back pain 9. PREGNANCY: "Is there any chance you are pregnant?" "When was your  last menstrual period?"     N/a  Protocols used: Hernia-A-AH

## 2023-12-11 ENCOUNTER — Encounter: Payer: Self-pay | Admitting: Nurse Practitioner

## 2023-12-11 ENCOUNTER — Ambulatory Visit (INDEPENDENT_AMBULATORY_CARE_PROVIDER_SITE_OTHER): Admitting: Nurse Practitioner

## 2023-12-11 VITALS — BP 106/54 | HR 50 | Temp 97.7°F | Ht 70.0 in | Wt 183.8 lb

## 2023-12-11 DIAGNOSIS — R052 Subacute cough: Secondary | ICD-10-CM | POA: Insufficient documentation

## 2023-12-11 DIAGNOSIS — K4090 Unilateral inguinal hernia, without obstruction or gangrene, not specified as recurrent: Secondary | ICD-10-CM | POA: Insufficient documentation

## 2023-12-11 DIAGNOSIS — I7143 Infrarenal abdominal aortic aneurysm, without rupture: Secondary | ICD-10-CM

## 2023-12-11 MED ORDER — AMOXICILLIN-POT CLAVULANATE 875-125 MG PO TABS: 1.0000 | ORAL_TABLET | Freq: Two times a day (BID) | ORAL | 0 refills | Status: AC

## 2023-12-11 NOTE — Assessment & Plan Note (Signed)
 Noted on CT scan 11/26/23.  Educated wife and him on this today.  He is taking Crestor and Plavix for history of stents.  Continue these for prevention.  Will recheck via ultrasound in 3 years.

## 2023-12-11 NOTE — Assessment & Plan Note (Signed)
 Noted on imaging and is causing some discomfort.  Will get him into general surgery for recommendations.  Recommend he wear supportive underwear and try to support area when coughing to help pain.  Avoid heavy lifting.

## 2023-12-11 NOTE — Progress Notes (Addendum)
 BP (!) 106/54   Pulse (!) 50   Temp 97.7 F (36.5 C) (Oral)   Ht 5\' 10"  (1.778 m)   Wt 183 lb 12.8 oz (83.4 kg)   SpO2 98%   BMI 26.37 kg/m    Subjective:    Patient ID: Mark Quinn, male    DOB: [birth year], 78 y.o.   MRN: [id number]  HPI: Mark Quinn is a 78 y.o. male  Chief Complaint  Patient presents with   Groin Pain    Patient states he has been having pain in his R goin area for the last month, gradually getting worse. States he was told he has an inguinal hernia in that area, confirmed by CT and US .    GROIN PAIN  Initially seen for this on 11/05/23, ultrasound was performed and noted a right inguinal hernia.  CT was then performed and this noted similar, medium in size with no obstruction.  CT also noted infrarenal abdominal aortic aneurysms 3.4 x 3.3 with "atheromatous changes of the infrarenal abdominal aorta and aortic  Bifurcation".  To repeat imaging in 3 years.  Right kidney cyst also noted with no recommendation for follow-up. He is a past smoker, quit 40 years.  Is having some pain to right groin area. On Sunday wife reports he had bad pain. Coughing makes pain worse.   Duration:weeks Onset: sudden Severity: 3/10 when coughs Quality: dull, aching, and throbbing Location: right groin area  Episode duration: intermittent Radiation: no Frequency: intermittent Alleviating factors: rest Aggravating factors: coughing Status: fluctuating Treatments attempted: none Nausea: no Vomiting: no Weight loss: no Decreased appetite: no Diarrhea: no Constipation: no  COUGH Has had bad cough for 2 weeks, this is improving some.   Duration: weeks Circumstances of initial development of cough: URI Cough severity: mild Cough description: productive Aggravating factors:  worse at night at times during day too Alleviating factors: Mucinex Status: slowly improving Treatments attempted: Mucinex Wheezing: a little bit Shortness of breath:  baseline Chest pain:  no Chest tightness:no Nasal congestion: no Runny nose: yes Postnasal drip: yes Frequent throat clearing or swallowing: yes Hemoptysis: no Fevers: no Night sweats:  occasional at baseline Weight loss: no Heartburn: no Recent foreign travel: no Tuberculosis contacts: no   Relevant past medical, surgical, family and social history reviewed and updated as indicated. Interim medical history since our last visit reviewed. Allergies and medications reviewed and updated.  Review of Systems  Constitutional:  Negative for activity change, diaphoresis, fatigue and fever.  Respiratory:  Positive for cough and wheezing. Negative for chest tightness and shortness of breath.   Cardiovascular:  Negative for chest pain, palpitations and leg swelling.  Gastrointestinal: Negative.   Neurological: Negative.   Psychiatric/Behavioral: Negative.      Per HPI unless specifically indicated above     Objective:    BP (!) 106/54   Pulse (!) 50   Temp 97.7 F (36.5 C) (Oral)   Ht 5\' 10"  (1.778 m)   Wt 183 lb 12.8 oz (83.4 kg)   SpO2 98%   BMI 26.37 kg/m   Wt Readings from Last 3 Encounters:  12/11/23 183 lb 12.8 oz (83.4 kg)  11/05/23 183 lb (83 kg)  09/24/23 183 lb 9.6 oz (83.3 kg)    Physical Exam Vitals and nursing note reviewed.  Constitutional:      General: He is awake. He is not in acute distress.    Appearance: He is well-developed and well-groomed. He is not ill-appearing or toxic-appearing.  HENT:     Head: Normocephalic.     Right Ear: Hearing and external ear normal.     Left Ear: Hearing and external ear normal.  Eyes:     General: Lids are normal.     Extraocular Movements: Extraocular movements intact.     Conjunctiva/sclera: Conjunctivae normal.  Neck:     Thyroid: No thyromegaly.     Vascular: No carotid bruit.  Cardiovascular:     Rate and Rhythm: Regular rhythm. Bradycardia present.     Heart sounds: Murmur heard.     Systolic murmur is present with a grade of  2/6.     No gallop.  Pulmonary:     Effort: No accessory muscle usage or respiratory distress.     Breath sounds: Normal breath sounds.  Abdominal:     General: Bowel sounds are normal. There is no distension.     Palpations: Abdomen is soft.     Tenderness: There is no abdominal tenderness.     Hernia: A hernia is present. Hernia is present in the right inguinal area. There is no hernia in the left inguinal area.  Musculoskeletal:     Cervical back: Full passive range of motion without pain.     Right lower leg: No edema.     Left lower leg: No edema.  Lymphadenopathy:     Cervical: No cervical adenopathy.  Skin:    General: Skin is warm.     Capillary Refill: Capillary refill takes less than 2 seconds.  Neurological:     Mental Status: He is alert and oriented to person, place, and time.     Deep Tendon Reflexes: Reflexes are normal and symmetric.     Reflex Scores:      Brachioradialis reflexes are 2+ on the right side and 2+ on the left side.      Patellar reflexes are 2+ on the right side and 2+ on the left side. Psychiatric:        Attention and Perception: Attention normal.        Mood and Affect: Mood normal.        Speech: Speech normal.        Behavior: Behavior normal. Behavior is cooperative.        Thought Content: Thought content normal.     Results for orders placed or performed in visit on 06/25/23  Comprehensive metabolic panel   Collection Time: 06/25/23  3:33 PM  Result Value Ref Range   Glucose 93 70 - 99 mg/dL   BUN 23 8 - 27 mg/dL   Creatinine, Ser 1.61 (H) 0.76 - 1.27 mg/dL   eGFR 54 (L) >09 UE/AVW/0.98   BUN/Creatinine Ratio 17 10 - 24   Sodium 139 134 - 144 mmol/L   Potassium 4.5 3.5 - 5.2 mmol/L   Chloride 104 96 - 106 mmol/L   CO2 21 20 - 29 mmol/L   Calcium 9.4 8.6 - 10.2 mg/dL   Total Protein 6.2 6.0 - 8.5 g/dL   Albumin 4.1 3.8 - 4.8 g/dL   Globulin, Total 2.1 1.5 - 4.5 g/dL   Bilirubin Total 0.4 0.0 - 1.2 mg/dL   Alkaline Phosphatase  48 44 - 121 IU/L   AST 38 0 - 40 IU/L   ALT 32 0 - 44 IU/L  Lipid Panel w/o Chol/HDL Ratio   Collection Time: 06/25/23  3:33 PM  Result Value Ref Range   Cholesterol, Total 120 100 - 199 mg/dL   Triglycerides 119 (H)  0 - 149 mg/dL   HDL 44 >14 mg/dL   VLDL Cholesterol Cal 33 5 - 40 mg/dL   LDL Chol Calc (NIH) 43 0 - 99 mg/dL      Assessment & Plan:   Problem List Items Addressed This Visit       Cardiovascular and Mediastinum   Abdominal aortic aneurysm (AAA) (HCC) - Primary   Noted on CT scan 11/26/23.  Educated wife and him on this today.  He is taking Crestor and Plavix for history of stents.  Continue these for prevention.  Will recheck via ultrasound in 3 years.        Other   Subacute cough   Acute and ongoing for over 2 weeks, some mild improvement.  Since has been present for some time will start Augmentin BID for 7 days.  Recommend: - Increased rest - Increasing Fluids - Acetaminophen as needed for fever/pain.  - Salt water gargling, chloraseptic spray and throat lozenges - Mucinex.  - Humidifying the air.       Right inguinal hernia   Noted on imaging and is causing some discomfort.  Will get him into general surgery for recommendations.  Recommend he wear supportive underwear and try to support area when coughing to help pain.  Avoid heavy lifting.      Relevant Orders   Ambulatory referral to General Surgery     Follow up plan: Return for as scheduled in 14 days.

## 2023-12-11 NOTE — Patient Instructions (Signed)
 Groin Hernia (Inguinal Hernia) in Adults: What to Know  A hernia happens when an organ or tissue in your body pushes out through a weak spot in the muscles of your belly. This makes a bulge. A groin hernia is also called an inguinal hernia. It's found in your groin, which is the area where your leg meets your lower belly. This kind of hernia could also be: In your scrotum, if you're male. In the folds of skin around your vagina, if you're male. You may be able to push the bulge back into your belly. If you can't push it in and blood flow is cut off to the hernia, you'll need surgery right away. What are the causes? A groin hernia may happen when you strain your belly muscles, such as when you: Lift a heavy object. Strain to poop. Cough. What increases the risk? You may be more likely to get a groin hernia if: You're male. You're 50 years or older. You're pregnant. You've had a groin hernia or belly surgery before. You smoke. You're overweight. You work at a job where you need to stand a lot or lift heavy things. What are the signs or symptoms? Symptoms may depend on how big the hernia is. If it's small, you may not have symptoms. If it's bigger, you may have: A bulge near your groin or genitals. Pain or burning in your groin. A dull ache or feeling of pressure in your groin. If blood flow is cut off to the tissues inside the hernia, you may also: Feel pain and tenderness when you touch the bulge. The skin over it may turn red or purple. Have a fever. Throw up or feel like you may throw up. Have trouble pooping or passing gas. How is this treated? Treatment depends on how big the hernia is and what symptoms you have. You may need: To be watched to see if the bulge grows bigger. Surgery. This may be done if the hernia is big or if you have symptoms. Follow these instructions at home: Lifestyle Ask if it's OK for you to lift. Try not to stand for long periods of time. Do not  smoke, vape, or use nicotine or tobacco. Stay at a healthy weight. Try not to do things that put pressure on your hernia. Preventing trouble pooping You may need to take these steps to help prevent or treat trouble pooping (constipation): Take medicines to help you poop. Eat foods high in fiber, like beans, whole grains, and fresh fruits and vegetables. Drink more fluids as told. General instructions Try to push the hernia back in place by very gently pressing on it while lying down. Do not try to force it back in if it won't push in easily. Watch your hernia for any changes in: Shape. Size. Color. Take your medicines only as told. Contact a doctor if: You have a fever. You have new symptoms. Your symptoms get worse. You can't poop or pass gas. Get help right away if: Your bulge: Starts to hurt a lot. Changes color. You have sudden pain in your scrotum, or your scrotum changes size. You can't gently push the hernia back in place. You feel like you may vomit, and that feeling does not go away. You keep throwing up or feeling like you need to throw up. These symptoms may be an emergency. Call 911 right away. Do not wait to see if the symptoms will go away. Do not drive yourself to the hospital. This information is  not intended to replace advice given to you by your health care provider. Make sure you discuss any questions you have with your health care provider. Document Revised: 04/11/2023 Document Reviewed: 04/11/2023 Elsevier Patient Education  2024 [employer].

## 2023-12-11 NOTE — Assessment & Plan Note (Addendum)
 Acute and ongoing for over 2 weeks, some mild improvement.  Since has been present for some time will start Augmentin BID for 7 days.  Recommend: - Increased rest - Increasing Fluids - Acetaminophen as needed for fever/pain.  - Salt water gargling, chloraseptic spray and throat lozenges - Mucinex.  - Humidifying the air.

## 2023-12-16 ENCOUNTER — Other Ambulatory Visit: Payer: Self-pay | Admitting: Nurse Practitioner

## 2023-12-16 DIAGNOSIS — I2510 Atherosclerotic heart disease of native coronary artery without angina pectoris: Secondary | ICD-10-CM

## 2023-12-16 DIAGNOSIS — N400 Enlarged prostate without lower urinary tract symptoms: Secondary | ICD-10-CM

## 2023-12-16 DIAGNOSIS — I10 Essential (primary) hypertension: Secondary | ICD-10-CM

## 2023-12-17 ENCOUNTER — Telehealth: Payer: Self-pay | Admitting: Cardiovascular Disease

## 2023-12-17 ENCOUNTER — Telehealth: Payer: Self-pay | Admitting: Nurse Practitioner

## 2023-12-17 ENCOUNTER — Encounter: Payer: Self-pay | Admitting: General Surgery

## 2023-12-17 ENCOUNTER — Telehealth: Payer: Self-pay | Admitting: *Deleted

## 2023-12-17 ENCOUNTER — Telehealth: Payer: Self-pay | Admitting: General Surgery

## 2023-12-17 ENCOUNTER — Ambulatory Visit: Payer: Self-pay | Admitting: General Surgery

## 2023-12-17 VITALS — BP 115/74 | HR 67 | Temp 98.1°F | Ht 70.0 in | Wt 178.4 lb

## 2023-12-17 DIAGNOSIS — K4090 Unilateral inguinal hernia, without obstruction or gangrene, not specified as recurrent: Secondary | ICD-10-CM

## 2023-12-17 NOTE — Telephone Encounter (Signed)
 Jenette Mitchell at [facility] called to confirm RX for Finasteride  (Proscar ) 5 MG which is not on current med list if patient still needs filled.Patient was last seen on 12/11/2023. Please advise

## 2023-12-17 NOTE — Telephone Encounter (Signed)
 Routing to provider to advise.

## 2023-12-17 NOTE — Telephone Encounter (Signed)
   Pre-operative Risk Assessment    Patient Name: Mark Quinn  DOB: 06-Apr-1946 MRN: [id number]   Date of last office visit: 09/24/23 Date of next office visit: n/a   Request for Surgical Clearance    Procedure:  Right Inguinal Hernia Repair  Date of Surgery:  Clearance 01/29/24                                Surgeon:  Dr. Severa Daniels Surgeon's Group or Practice Name:   [facility] Phone number:  [phone] Fax number:  [phone]   Type of Clearance Requested:   - Pharmacy:  Hold Clopidogrel  (Plavix )     Type of Anesthesia:  General    Additional requests/questions:    Signed, Fletcher Humble   12/17/2023, 10:02 AM

## 2023-12-17 NOTE — Telephone Encounter (Signed)
Faxed Cardiac Clearance to Dr. Ida Rogue at [phone].

## 2023-12-17 NOTE — Telephone Encounter (Signed)
 Requested Prescriptions  Pending Prescriptions Disp Refills   clopidogrel  (PLAVIX ) 75 MG tablet [Pharmacy Med Name: CLOPIDOGREL  75 MG TABLET] 30 tablet 0    Sig: Take 1 tablet (75 mg total) by mouth daily.     Hematology: Antiplatelets - clopidogrel  Failed - 12/17/2023 11:14 AM      Failed - HCT in normal range and within 180 days    Hematocrit  Date Value Ref Range Status  12/03/2022 39.2 37.5 - 51.0 % Final         Failed - HGB in normal range and within 180 days    Hemoglobin  Date Value Ref Range Status  12/03/2022 13.5 13.0 - 17.7 g/dL Final         Failed - PLT in normal range and within 180 days    Platelets  Date Value Ref Range Status  12/03/2022 248 150 - 450 x10E3/uL Final         Failed - Cr in normal range and within 360 days    Creatinine, Ser  Date Value Ref Range Status  06/25/2023 1.37 (H) 0.76 - 1.27 mg/dL Final         Failed - Valid encounter within last 6 months    Recent Outpatient Visits           6 days ago Infrarenal abdominal aortic aneurysm (AAA) without rupture (HCC)   [place] [facility] [place], Sanjuan Crumbly T, NP   1 month ago Groin mass   [place] [facility] Aileen Alexanders, NP       Future Appointments             In 1 week Cannady, Jolene T, NP [place] [facility]             ezetimibe  (ZETIA ) 10 MG tablet [Pharmacy Med Name: EZETIMIBE  10 MG TABLET] 30 tablet 0    Sig: Take 1 tablet (10 mg total) by mouth daily.     Cardiovascular:  Antilipid - Sterol Transport Inhibitors Failed - 12/17/2023 11:14 AM      Failed - Valid encounter within last 12 months    Recent Outpatient Visits           6 days ago Infrarenal abdominal aortic aneurysm (AAA) without rupture (HCC)   [place] [facility] [place], Sanjuan Crumbly T, NP   1 month ago Groin mass   [place] [facility] Aileen Alexanders, NP       Future Appointments             In 1 week Doran Galloway T, NP [place] [facility]            Failed - Lipid Panel in normal range within the last 12 months    Cholesterol, Total  Date Value Ref Range Status  06/25/2023 120 100 - 199 mg/dL Final   Cholesterol Piccolo, Waived  Date Value Ref Range Status  05/29/2018 160 <200 mg/dL Final    Comment:                            Desirable                <200                         Borderline High      200- 239  High                     >239    LDL Chol Calc (NIH)  Date Value Ref Range Status  06/25/2023 43 0 - 99 mg/dL Final   HDL  Date Value Ref Range Status  06/25/2023 44 >39 mg/dL Final   Triglycerides  Date Value Ref Range Status  06/25/2023 205 (H) 0 - 149 mg/dL Final   Triglycerides Piccolo,Waived  Date Value Ref Range Status  05/29/2018 229 (H) <150 mg/dL Final    Comment:                            Normal                   <150                         Borderline High     150 - 199                         High                200 - 499                         Very High                >499          Passed - AST in normal range and within 360 days    AST  Date Value Ref Range Status  06/25/2023 38 0 - 40 IU/L Final   AST (SGOT) Piccolo, Waived  Date Value Ref Range Status  05/29/2018 33 11 - 38 U/L Final         Passed - ALT in normal range and within 360 days    ALT  Date Value Ref Range Status  06/25/2023 32 0 - 44 IU/L Final   ALT (SGPT) Piccolo, Waived  Date Value Ref Range Status  05/29/2018 31 10 - 47 U/L Final         Passed - Patient is not pregnant       finasteride  (PROSCAR ) 5 MG tablet [Pharmacy Med Name: FINASTERIDE  5 MG TABLET] 90 tablet 0    Sig: Take 1 tablet (5 mg total) by mouth daily.     Urology: 5-alpha Reductase Inhibitors Failed - 12/17/2023 11:14 AM      Failed - PSA in normal range and within 360 days    Prostate Specific Ag, Serum  Date Value Ref Range Status  05/31/2022  3.9 0.0 - 4.0 ng/mL Final    Comment:    Roche ECLIA methodology. According to the American Urological Association, Serum PSA should decrease and remain at undetectable levels after radical prostatectomy. The AUA defines biochemical recurrence as an initial PSA value 0.2 ng/mL or greater followed by a subsequent confirmatory PSA value 0.2 ng/mL or greater. Values obtained with different assay methods or kits cannot be used interchangeably. Results cannot be interpreted as absolute evidence of the presence or absence of malignant disease.          Failed - Valid encounter within last 12 months    Recent Outpatient Visits           6 days ago Infrarenal abdominal aortic aneurysm (AAA) without rupture (HCC)   [facility] [place], [place] T, NP   1 month ago Groin mass   [facility] [facility] Aileen Alexanders, NP       Future Appointments             In 1 week Cannady, Jolene T, NP [place] [facility]             lisinopril  (ZESTRIL ) 20 MG tablet [Pharmacy Med Name: LISINOPRIL  20 MG TABLET] 30 tablet 0    Sig: Take 1 tablet (20 mg total) by mouth daily.     Cardiovascular:  ACE Inhibitors Failed - 12/17/2023 11:14 AM      Failed - Cr in normal range and within 180 days    Creatinine, Ser  Date Value Ref Range Status  06/25/2023 1.37 (H) 0.76 - 1.27 mg/dL Final         Failed - Valid encounter within last 6 months    Recent Outpatient Visits           6 days ago Infrarenal abdominal aortic aneurysm (AAA) without rupture (HCC)   [place] [facility] [place], [place] T, NP   1 month ago Groin mass   [place] [facility] Aileen Alexanders, NP       Future Appointments             In 1 week Lemar Pyles, NP [place] [facility]            Passed - K in normal range and within 180 days    Potassium  Date Value Ref Range Status  06/25/2023 4.5  3.5 - 5.2 mmol/L Final         Passed - Patient is not pregnant      Passed - Last BP in normal range    BP Readings from Last 1 Encounters:  12/17/23 115/74          metoprolol  succinate (TOPROL -XL) 50 MG 24 hr tablet [Pharmacy Med Name: METOPROLOL  SUCC ER 50 MG TAB] 30 tablet 0    Sig: Take with or immediately following a meal.     Cardiovascular:  Beta Blockers Failed - 12/17/2023 11:14 AM      Failed - Valid encounter within last 6 months    Recent Outpatient Visits           6 days ago Infrarenal abdominal aortic aneurysm (AAA) without rupture (HCC)   [place] [facility] [place], Sanjuan Crumbly T, NP   1 month ago Groin mass   [place] [facility] Aileen Alexanders, NP       Future Appointments             In 1 week Cannady, Sanjuan Crumbly T, NP  [facility]            Passed - Last BP in normal range    BP Readings from Last 1 Encounters:  12/17/23 115/74         Passed - Last Heart Rate in normal range    Pulse Readings from Last 1 Encounters:  12/17/23 67          rosuvastatin  (CRESTOR ) 40 MG tablet [Pharmacy Med Name: ROSUVASTATIN  CALCIUM  40 MG TAB] 30 tablet 0    Sig: Take 1 tablet (40 mg total) by mouth daily.     Cardiovascular:  Antilipid - Statins 2 Failed - 12/17/2023 11:14 AM      Failed -  Cr in normal range and within 360 days    Creatinine, Ser  Date Value Ref Range Status  06/25/2023 1.37 (H) 0.76 - 1.27 mg/dL Final         Failed - Valid encounter within last 12 months    Recent Outpatient Visits           6 days ago Infrarenal abdominal aortic aneurysm (AAA) without rupture (HCC)   [place] [facility] [place], [place] T, NP   1 month ago Groin mass   [place] [facility] Aileen Alexanders, NP       Future Appointments             In 1 week Cannady, Lavelle Posey, NP  [facility]            Failed - Lipid Panel in normal  range within the last 12 months    Cholesterol, Total  Date Value Ref Range Status  06/25/2023 120 100 - 199 mg/dL Final   Cholesterol Piccolo, Waived  Date Value Ref Range Status  05/29/2018 160 <200 mg/dL Final    Comment:                            Desirable                <200                         Borderline High      200- 239                         High                     >239    LDL Chol Calc (NIH)  Date Value Ref Range Status  06/25/2023 43 0 - 99 mg/dL Final   HDL  Date Value Ref Range Status  06/25/2023 44 >39 mg/dL Final   Triglycerides  Date Value Ref Range Status  06/25/2023 205 (H) 0 - 149 mg/dL Final   Triglycerides Piccolo,Waived  Date Value Ref Range Status  05/29/2018 229 (H) <150 mg/dL Final    Comment:                            Normal                   <150                         Borderline High     150 - 199                         High                200 - 499                         Very High                >499          Passed - Patient is not pregnant

## 2023-12-17 NOTE — Telephone Encounter (Signed)
 Called and spoke to patient. He states he is not taking this medication.   Called and notified [address] that the medication can be discontinued since the patient is no longer taking it.

## 2023-12-17 NOTE — Telephone Encounter (Signed)
 Patient has been advised of Pre-Admission date/time, and Surgery date at [facility].  Surgery Date: 01/29/24 Preadmission Testing Date: 01/22/24 (phone 8a-1p)  Patient informed of the scheduling process and surgery information given at time of office visit.  Patient has been made aware to call [phone], between 1-3:00pm the day before surgery, to find out what time to arrive for surgery.

## 2023-12-17 NOTE — Telephone Encounter (Signed)
 Requested medication (s) are due for refill today: review  Requested medication (s) are on the active medication list: yes  Last refill:  12/03/22  Future visit scheduled: yes  Notes to clinic:  Unable to refill per protocol due to failed labs, no updated results.      Requested Prescriptions  Pending Prescriptions Disp Refills   finasteride  (PROSCAR ) 5 MG tablet [Pharmacy Med Name: FINASTERIDE  5 MG TABLET] 90 tablet 0    Sig: Take 1 tablet (5 mg total) by mouth daily.     Urology: 5-alpha Reductase Inhibitors Failed - 12/17/2023 11:16 AM      Failed - PSA in normal range and within 360 days    Prostate Specific Ag, Serum  Date Value Ref Range Status  05/31/2022 3.9 0.0 - 4.0 ng/mL Final    Comment:    Roche ECLIA methodology. According to the American Urological Association, Serum PSA should decrease and remain at undetectable levels after radical prostatectomy. The AUA defines biochemical recurrence as an initial PSA value 0.2 ng/mL or greater followed by a subsequent confirmatory PSA value 0.2 ng/mL or greater. Values obtained with different assay methods or kits cannot be used interchangeably. Results cannot be interpreted as absolute evidence of the presence or absence of malignant disease.          Failed - Valid encounter within last 12 months    Recent Outpatient Visits           6 days ago Infrarenal abdominal aortic aneurysm (AAA) without rupture (HCC)   [place] [facility] [place], Sanjuan Crumbly T, NP   1 month ago Groin mass   [place] [facility] [place], Mariah Shines, NP       Future Appointments             In 1 week Cannady, Jolene T, NP [place] [facility]            Signed Prescriptions Disp Refills   clopidogrel  (PLAVIX ) 75 MG tablet 30 tablet 0    Sig: Take 1 tablet (75 mg total) by mouth daily.     Hematology: Antiplatelets - clopidogrel  Failed - 12/17/2023 11:16 AM      Failed - HCT in normal  range and within 180 days    Hematocrit  Date Value Ref Range Status  12/03/2022 39.2 37.5 - 51.0 % Final         Failed - HGB in normal range and within 180 days    Hemoglobin  Date Value Ref Range Status  12/03/2022 13.5 13.0 - 17.7 g/dL Final         Failed - PLT in normal range and within 180 days    Platelets  Date Value Ref Range Status  12/03/2022 248 150 - 450 x10E3/uL Final         Failed - Cr in normal range and within 360 days    Creatinine, Ser  Date Value Ref Range Status  06/25/2023 1.37 (H) 0.76 - 1.27 mg/dL Final         Failed - Valid encounter within last 6 months    Recent Outpatient Visits           6 days ago Infrarenal abdominal aortic aneurysm (AAA) without rupture (HCC)   [place] [facility] [place], Sanjuan Crumbly T, NP   1 month ago Groin mass   [place] [facility] Aileen Alexanders, NP       Future Appointments  In 1 week Cannady, Jolene T, NP [place] [facility]             ezetimibe  (ZETIA ) 10 MG tablet 30 tablet 0    Sig: Take 1 tablet (10 mg total) by mouth daily.     Cardiovascular:  Antilipid - Sterol Transport Inhibitors Failed - 12/17/2023 11:16 AM      Failed - Valid encounter within last 12 months    Recent Outpatient Visits           6 days ago Infrarenal abdominal aortic aneurysm (AAA) without rupture (HCC)   [place] [facility] [place], [place] T, NP   1 month ago Groin mass   [place] [facility] Aileen Alexanders, NP       Future Appointments             In 1 week Cannady, Lavelle Posey, NP [place] [facility]            Failed - Lipid Panel in normal range within the last 12 months    Cholesterol, Total  Date Value Ref Range Status  06/25/2023 120 100 - 199 mg/dL Final   Cholesterol Piccolo, Waived  Date Value Ref Range Status  05/29/2018 160 <200 mg/dL Final    Comment:                             Desirable                <200                         Borderline High      200- 239                         High                     >239    LDL Chol Calc (NIH)  Date Value Ref Range Status  06/25/2023 43 0 - 99 mg/dL Final   HDL  Date Value Ref Range Status  06/25/2023 44 >39 mg/dL Final   Triglycerides  Date Value Ref Range Status  06/25/2023 205 (H) 0 - 149 mg/dL Final   Triglycerides Piccolo,Waived  Date Value Ref Range Status  05/29/2018 229 (H) <150 mg/dL Final    Comment:                            Normal                   <150                         Borderline High     150 - 199                         High                200 - 499                         Very High                >499          Passed -  AST in normal range and within 360 days    AST  Date Value Ref Range Status  06/25/2023 38 0 - 40 IU/L Final   AST (SGOT) Piccolo, Waived  Date Value Ref Range Status  05/29/2018 33 11 - 38 U/L Final         Passed - ALT in normal range and within 360 days    ALT  Date Value Ref Range Status  06/25/2023 32 0 - 44 IU/L Final   ALT (SGPT) Piccolo, Waived  Date Value Ref Range Status  05/29/2018 31 10 - 47 U/L Final         Passed - Patient is not pregnant       lisinopril  (ZESTRIL ) 20 MG tablet 30 tablet 0    Sig: Take 1 tablet (20 mg total) by mouth daily.     Cardiovascular:  ACE Inhibitors Failed - 12/17/2023 11:16 AM      Failed - Cr in normal range and within 180 days    Creatinine, Ser  Date Value Ref Range Status  06/25/2023 1.37 (H) 0.76 - 1.27 mg/dL Final         Failed - Valid encounter within last 6 months    Recent Outpatient Visits           6 days ago Infrarenal abdominal aortic aneurysm (AAA) without rupture (HCC)   [place] [facility] [place], Sanjuan Crumbly T, NP   1 month ago Groin mass   [place] [facility] Aileen Alexanders, NP       Future Appointments             In 1 week Lemar Pyles, NP [facility] [place]            Passed - K in normal range and within 180 days    Potassium  Date Value Ref Range Status  06/25/2023 4.5 3.5 - 5.2 mmol/L Final         Passed - Patient is not pregnant      Passed - Last BP in normal range    BP Readings from Last 1 Encounters:  12/17/23 115/74          metoprolol  succinate (TOPROL -XL) 50 MG 24 hr tablet 30 tablet 0    Sig: Take with or immediately following a meal.     Cardiovascular:  Beta Blockers Failed - 12/17/2023 11:16 AM      Failed - Valid encounter within last 6 months    Recent Outpatient Visits           6 days ago Infrarenal abdominal aortic aneurysm (AAA) without rupture (HCC)   [place] [facility], Sanjuan Crumbly T, NP   1 month ago Groin mass   [place] [facility] Aileen Alexanders, NP       Future Appointments             In 1 week Cannady, Sanjuan Crumbly T, NP [place] [facility]            Passed - Last BP in normal range    BP Readings from Last 1 Encounters:  12/17/23 115/74         Passed - Last Heart Rate in normal range    Pulse Readings from Last 1 Encounters:  12/17/23 67          rosuvastatin  (CRESTOR ) 40 MG tablet 30 tablet 0    Sig: Take 1 tablet (  40 mg total) by mouth daily.     Cardiovascular:  Antilipid - Statins 2 Failed - 12/17/2023 11:16 AM      Failed - Cr in normal range and within 360 days    Creatinine, Ser  Date Value Ref Range Status  06/25/2023 1.37 (H) 0.76 - 1.27 mg/dL Final         Failed - Valid encounter within last 12 months    Recent Outpatient Visits           6 days ago Infrarenal abdominal aortic aneurysm (AAA) without rupture (HCC)   [place] [facility] [place], Sanjuan Crumbly T, NP   1 month ago Groin mass   [place] [facility] Aileen Alexanders, NP       Future Appointments             In 1 week Cannady, Jolene T, NP [place]  [facility]            Failed - Lipid Panel in normal range within the last 12 months    Cholesterol, Total  Date Value Ref Range Status  06/25/2023 120 100 - 199 mg/dL Final   Cholesterol Piccolo, Waived  Date Value Ref Range Status  05/29/2018 160 <200 mg/dL Final    Comment:                            Desirable                <200                         Borderline High      200- 239                         High                     >239    LDL Chol Calc (NIH)  Date Value Ref Range Status  06/25/2023 43 0 - 99 mg/dL Final   HDL  Date Value Ref Range Status  06/25/2023 44 >39 mg/dL Final   Triglycerides  Date Value Ref Range Status  06/25/2023 205 (H) 0 - 149 mg/dL Final   Triglycerides Piccolo,Waived  Date Value Ref Range Status  05/29/2018 229 (H) <150 mg/dL Final    Comment:                            Normal                   <150                         Borderline High     150 - 199                         High                200 - 499                         Very High                >499          Passed - Patient is  not pregnant

## 2023-12-17 NOTE — Patient Instructions (Signed)
 You have chose to have your hernia repaired. This will be done by Dr. Cornel Diesel at [facility].  If you are on any injectable weight loss medication, you will need to stop taking your GLP-1 injectable (weight loss) medications 8 days before your surgery to avoid any complications with anesthesia.   Please see your (blue) Pre-care information that you have been given today. Our surgery scheduler will call you to verify surgery date and to go over information.   You will need to arrange to be out of work for approximately 1-2 weeks and then you may return with a lifting restriction for 4 more weeks. If you have FMLA or Disability paperwork that needs to be filled out, please have your company fax your paperwork to [phone] or you may drop this by either office. This paperwork will be filled out within 3 days after your surgery has been completed.  You may have a bruise in your groin and also swelling and brusing in your testicle area. You may use ice 4-5 times daily for 15-20 minutes each time. Make sure that you place a barrier between you and the ice pack. To decrease the swelling, you may roll up a bath towel and place it vertically in between your thighs with your testicles resting on the towel. You will want to keep this area elevated as much as possible for several days following surgery.    Inguinal Hernia, Adult Muscles help keep everything in the body in its proper place. But if a weak spot in the muscles develops, something can poke through. That is called a hernia. When this happens in the lower part of the belly (abdomen), it is called an inguinal hernia. (It takes its name from a part of the body in this region called the inguinal canal.) A weak spot in the wall of muscles lets some fat or part of the small intestine bulge through. An inguinal hernia can develop at any age. Men get them more often than women. CAUSES  In adults, an inguinal hernia develops over time. It can be  triggered by: Suddenly straining the muscles of the lower abdomen. Lifting heavy objects. Straining to have a bowel movement. Difficult bowel movements (constipation) can lead to this. Constant coughing. This may be caused by smoking or lung disease. Being overweight. Being pregnant. Working at a job that requires long periods of standing or heavy lifting. Having had an inguinal hernia before. One type can be an emergency situation. It is called a strangulated inguinal hernia. It develops if part of the small intestine slips through the weak spot and cannot get back into the abdomen. The blood supply can be cut off. If that happens, part of the intestine may die. This situation requires emergency surgery. SYMPTOMS  Often, a small inguinal hernia has no symptoms. It is found when a healthcare provider does a physical exam. Larger hernias usually have symptoms.  In adults, symptoms may include: A lump in the groin. This is easier to see when the person is standing. It might disappear when lying down. In men, a lump in the scrotum. Pain or burning in the groin. This occurs especially when lifting, straining or coughing. A dull ache or feeling of pressure in the groin. Signs of a strangulated hernia can include: A bulge in the groin that becomes very painful and tender to the touch. A bulge that turns red or purple. Fever, nausea and vomiting. Inability to have a bowel movement or to pass gas.  DIAGNOSIS  To decide if you have an inguinal hernia, a healthcare provider will probably do a physical examination. This will include asking questions about any symptoms you have noticed. The healthcare provider might feel the groin area and ask you to cough. If an inguinal hernia is felt, the healthcare provider may try to slide it back into the abdomen. Usually no other tests are needed. TREATMENT  Treatments can vary. The size of the hernia makes a difference. Options include: Watchful waiting. This  is often suggested if the hernia is small and you have had no symptoms. No medical procedure will be done unless symptoms develop. You will need to watch closely for symptoms. If any occur, contact your healthcare provider right away. Surgery. This is used if the hernia is larger or you have symptoms. Open surgery. This is usually an outpatient procedure (you will not stay overnight in a hospital). An cut (incision) is made through the skin in the groin. The hernia is put back inside the abdomen. The weak area in the muscles is then repaired by herniorrhaphy or hernioplasty. Herniorrhaphy: in this type of surgery, the weak muscles are sewn back together. Hernioplasty: a patch or mesh is used to close the weak area in the abdominal wall. Laparoscopy. In this procedure, a surgeon makes small incisions. A thin tube with a tiny video camera (called a laparoscope) is put into the abdomen. The surgeon repairs the hernia with mesh by looking with the video camera and using two long instruments. HOME CARE INSTRUCTIONS  After surgery to repair an inguinal hernia: You will need to take pain medicine prescribed by your healthcare provider. Follow all directions carefully. You will need to take care of the wound from the incision. Your activity will be restricted for awhile. This will probably include no heavy lifting for several weeks. You also should not do anything too active for a few weeks. When you can return to work will depend on the type of job that you have. During "watchful waiting" periods, you should: Maintain a healthy weight. Eat a diet high in fiber (fruits, vegetables and whole grains). Drink plenty of fluids to avoid constipation. This means drinking enough water and other liquids to keep your urine clear or pale yellow. Do not lift heavy objects. Do not stand for long periods of time. Quit smoking. This should keep you from developing a frequent cough. SEEK MEDICAL CARE IF:  A bulge  develops in your groin area. You feel pain, a burning sensation or pressure in the groin. This might be worse if you are lifting or straining. You develop a fever of more than 100.5 F (38.1 C). SEEK IMMEDIATE MEDICAL CARE IF:  Pain in the groin increases suddenly. A bulge in the groin gets bigger suddenly and does not go down. For men, there is sudden pain in the scrotum. Or, the size of the scrotum increases. A bulge in the groin area becomes red or purple and is painful to touch. You have nausea or vomiting that does not go away. You feel your heart beating much faster than normal. You cannot have a bowel movement or pass gas. You develop a fever of more than 102.0 F (38.9 C).   This information is not intended to replace advice given to you by your health care provider. Make sure you discuss any questions you have with your health care provider.   Document Released: 12/30/2008 Document Revised: 11/05/2011 Document Reviewed: 02/14/2015 Elsevier Interactive Patient Education [employer]

## 2023-12-18 ENCOUNTER — Ambulatory Visit: Payer: Self-pay | Admitting: General Surgery

## 2023-12-18 DIAGNOSIS — K4090 Unilateral inguinal hernia, without obstruction or gangrene, not specified as recurrent: Secondary | ICD-10-CM

## 2023-12-18 NOTE — Progress Notes (Signed)
 Patient ID: Mark Quinn, male   DOB: 1945/10/12, 78 y.o.   MRN: [id number] CC: Right Inguinal Hernia History of Present Illness Mark Quinn is a 78 y.o. male with .  Past medical history significant for coronary artery disease who is on Plavix  who presents consultation for right inguinal hernia.  The patient reports that several weeks ago he noticed a bulge in his right groin.  He says that this is worse after working throughout the day.  He says that he has pain that radiates down to his groin.  He reports that when he lays down the bulge disappears.  He denies any previous history of hernia or hernia repair.  He denies any problems on the left side.  He denies any obstipation.  He also denies any skin changes.  He is on Plavix  and has had a history of a coronary artery stent placement.  Past Medical History Past Medical History:  Diagnosis Date   CAD (coronary artery disease)    Cellulitis    of back   Gout    Melanoma (HCC) 06/2013   Stage 1 A Excision   Obesity       Past Surgical History:  Procedure Laterality Date   CARDIAC CATHETERIZATION  2006   [facility]   CORONARY STENT PLACEMENT  2006    CYPHER drug eluting Stent RX 3.50 X 56mm/CYPHER drug eluting Stent RX 3.50 X 28mm    Allergies  Allergen Reactions   Other     Seasonal Allergies    Current Outpatient Medications  Medication Sig Dispense Refill   allopurinol  (ZYLOPRIM ) 100 MG tablet Take 1 tablet (100 mg total) by mouth daily. 90 tablet 4   amoxicillin -clavulanate (AUGMENTIN ) 875-125 MG tablet Take 1 tablet by mouth 2 (two) times daily for 7 days. 14 tablet 0   clopidogrel  (PLAVIX ) 75 MG tablet Take 1 tablet (75 mg total) by mouth daily. 30 tablet 0   diphenhydramine-acetaminophen (TYLENOL PM) 25-500 MG TABS tablet Take 1 tablet by mouth at bedtime as needed.     ezetimibe  (ZETIA ) 10 MG tablet Take 1 tablet (10 mg total) by mouth daily. 30 tablet 0   lisinopril  (ZESTRIL ) 20 MG tablet Take 1 tablet (20 mg total) by  mouth daily. 30 tablet 0   meloxicam  (MOBIC ) 15 MG tablet Take 1 tablet every day by oral route.     metoprolol  succinate (TOPROL -XL) 50 MG 24 hr tablet Take with or immediately following a meal. 30 tablet 0   Multiple Vitamin (MULTIVITAMIN) tablet Take 1 tablet by mouth daily.     rosuvastatin  (CRESTOR ) 40 MG tablet Take 1 tablet (40 mg total) by mouth daily. 30 tablet 0   No current facility-administered medications for this visit.    Family History Family History  Problem Relation Age of Onset   Heart disease Mother    Hyperlipidemia Mother    Diabetes Son        Social History Social History   Tobacco Use   Smoking status: Former    Current packs/day: 0.00    Average packs/day: 1 pack/day for 8.0 years (8.0 ttl pk-yrs)    Types: Cigarettes    Start date: 04/13/1967    Quit date: 04/13/1975    Years since quitting: 48.7   Smokeless tobacco: Never  Vaping Use   Vaping status: Never Used  Substance Use Topics   Alcohol use: Yes    Alcohol/week: 0.0 standard drinks of alcohol    Comment: on occasion  Drug use: No        ROS Full ROS of systems performed and is otherwise negative there than what is stated in the HPI  Physical Exam Blood pressure 115/74, pulse 67, temperature 98.1 F (36.7 C), temperature source Oral, height 5\' 10"  (1.778 m), weight 178 lb 6.4 oz (80.9 kg), SpO2 96%.  No acute distress, alert and oriented x 3, normal work of breathing on room air, regular rate and rhythm, clear to auscultation bilaterally, abdomen is soft, nontender nondistended.  Right groin with obvious bulge on standing and Valsalva that is easily reducible.  This is on the right side but there is no evidence of hernia on the left side.  Data Reviewed I reviewed his ultrasound and CT that both show a right inguinal hernia that contains fat.  I have personally reviewed the patient's imaging and medical records.    Assessment/Plan    78 year old male with past medical history  significant for coronary artery disease who presents in consultation for right inguinal hernia.  This is easily reducible.  The patient is going on vacation in May and I recommended that he have this repaired after this.  In the meantime we will have him undergo cardiac restratification.  He will need to hold his Plavix  for 6 days prior to the procedure.  Discussed the risk, benefits alternatives of the procedure including risk of infection, bleeding damage to the vas deferens, testicular ischemia, chronic pain and recurrence.  I did discuss the use of mesh with the patient.  He understands these risks and wishes to proceed with surgery     Mark Quinn 12/18/2023, 11:05 AM

## 2023-12-20 NOTE — Telephone Encounter (Signed)
     Primary Cardiologist: Timothy Gollan, MD  Chart reviewed as part of pre-operative protocol coverage. Given past medical history and time since last visit, based on ACC/AHA guidelines, Mark Quinn would be at acceptable risk for the planned procedure without further cardiovascular testing.  Per Dr. Gollan Patient was seen in our office January 2025, at that time appeared stable with no anginal symptoms  He is acceptable risk for hernia surgery  Please hold Plavix  5 days prior to surgery  When not on Plavix  he should take aspirin 81 daily  Please have patient resume Plavix  following surgery when cleared by surgeon    I will route this recommendation to the requesting party via Epic fax function and remove from pre-op pool.  Please call with questions.  Chet Cota. Acea Yagi NP-C     12/20/2023, 4:24 PM [facility] [address] Office [phone] Fax [phone]

## 2023-12-22 NOTE — Patient Instructions (Signed)
 Be Involved in Caring For Your Health:  Taking Medications When medications are taken as directed, they can greatly improve your health. But if they are not taken as prescribed, they may not work. In some cases, not taking them correctly can be harmful. To help ensure your treatment remains effective and safe, understand your medications and how to take them. Bring your medications to each visit for review by your provider.  Your lab results, notes, and after visit summary will be available on My Chart. We strongly encourage you to use this feature. If lab results are abnormal the clinic will contact you with the appropriate steps. If the clinic does not contact you assume the results are satisfactory. You can always view your results on My Chart. If you have questions regarding your health or results, please contact the clinic during office hours. You can also ask questions on My Chart.  We at [facility] are grateful that you chose [place] to provide your care. We strive to provide evidence-based and compassionate care and are always looking for feedback. If you get a survey from the clinic please complete this so we can hear your opinions.  Heart-Healthy Eating Plan Many factors influence your heart health, including eating and exercise habits. Heart health is also called coronary health. Coronary risk increases with abnormal blood fat (lipid) levels. A heart-healthy eating plan includes limiting unhealthy fats, increasing healthy fats, limiting salt (sodium) intake, and making other diet and lifestyle changes. What is my plan? Your health care provider may recommend that: You limit your fat intake to _________% or less of your total calories each day. You limit your saturated fat intake to _________% or less of your total calories each day. You limit the amount of cholesterol in your diet to less than _________ mg per day. You limit the amount of sodium in your diet to less than _________  mg per day. What are tips for following this plan? Cooking Cook foods using methods other than frying. Baking, boiling, grilling, and broiling are all good options. Other ways to reduce fat include: Removing the skin from poultry. Removing all visible fats from meats. Steaming vegetables in water or broth. Meal planning  At meals, imagine dividing your plate into fourths: Fill one-half of your plate with vegetables and green salads. Fill one-fourth of your plate with whole grains. Fill one-fourth of your plate with lean protein foods. Eat 2-4 cups of vegetables per day. One cup of vegetables equals 1 cup (91 g) broccoli or cauliflower florets, 2 medium carrots, 1 large bell pepper, 1 large sweet potato, 1 large tomato, 1 medium white potato, 2 cups (150 g) raw leafy greens. Eat 1-2 cups of fruit per day. One cup of fruit equals 1 small apple, 1 large banana, 1 cup (237 g) mixed fruit, 1 large orange,  cup (82 g) dried fruit, 1 cup (240 mL) 100% fruit juice. Eat more foods that contain soluble fiber. Examples include apples, broccoli, carrots, beans, peas, and barley. Aim to get 25-30 g of fiber per day. Increase your consumption of legumes, nuts, and seeds to 4-5 servings per week. One serving of dried beans or legumes equals  cup (90 g) cooked, 1 serving of nuts is  oz (12 almonds, 24 pistachios, or 7 walnut halves), and 1 serving of seeds equals  oz (8 g). Fats Choose healthy fats more often. Choose monounsaturated and polyunsaturated fats, such as olive and canola oils, avocado oil, flaxseeds, walnuts, almonds, and seeds. Eat  more omega-3 fats. Choose salmon, mackerel, sardines, tuna, flaxseed oil, and ground flaxseeds. Aim to eat fish at least 2 times each week. Check food labels carefully to identify foods with trans fats or high amounts of saturated fat. Limit saturated fats. These are found in animal products, such as meats, butter, and cream. Plant sources of saturated fats  include palm oil, palm kernel oil, and coconut oil. Avoid foods with partially hydrogenated oils in them. These contain trans fats. Examples are stick margarine, some tub margarines, cookies, crackers, and other baked goods. Avoid fried foods. General information Eat more home-cooked food and less restaurant, buffet, and fast food. Limit or avoid alcohol. Limit foods that are high in added sugar and simple starches such as foods made using white refined flour (white breads, pastries, sweets). Lose weight if you are overweight. Losing just 5-10% of your body weight can help your overall health and prevent diseases such as diabetes and heart disease. Monitor your sodium intake, especially if you have high blood pressure. Talk with your health care provider about your sodium intake. Try to incorporate more vegetarian meals weekly. What foods should I eat? Fruits All fresh, canned (in natural juice), or frozen fruits. Vegetables Fresh or frozen vegetables (raw, steamed, roasted, or grilled). Green salads. Grains Most grains. Choose whole wheat and whole grains most of the time. Rice and pasta, including brown rice and pastas made with whole wheat. Meats and other proteins Lean, well-trimmed beef, veal, pork, and lamb. Chicken and [place] without skin. All fish and shellfish. Wild duck, rabbit, pheasant, and venison. Egg whites or low-cholesterol egg substitutes. Dried beans, peas, lentils, and tofu. Seeds and most nuts. Dairy Low-fat or nonfat cheeses, including ricotta and mozzarella. Skim or 1% milk (liquid, powdered, or evaporated). Buttermilk made with low-fat milk. Nonfat or low-fat yogurt. Fats and oils Non-hydrogenated (trans-free) margarines. Vegetable oils, including soybean, sesame, sunflower, olive, avocado, peanut, safflower, corn, canola, and cottonseed. Salad dressings or mayonnaise made with a vegetable oil. Beverages Water (mineral or sparkling). Coffee and tea. Unsweetened ice  tea. Diet beverages. Sweets and desserts Sherbet, gelatin, and fruit ice. Small amounts of dark chocolate. Limit all sweets and desserts. Seasonings and condiments All seasonings and condiments. The items listed above may not be a complete list of foods and beverages you can eat. Contact a dietitian for more options. What foods should I avoid? Fruits Canned fruit in heavy syrup. Fruit in cream or butter sauce. Fried fruit. Limit coconut. Vegetables Vegetables cooked in cheese, cream, or butter sauce. Fried vegetables. Grains Breads made with saturated or trans fats, oils, or whole milk. Croissants. Sweet rolls. Donuts. High-fat crackers, such as cheese crackers and chips. Meats and other proteins Fatty meats, such as hot dogs, ribs, sausage, bacon, rib-eye roast or steak. High-fat deli meats, such as salami and bologna. Caviar. Domestic duck and goose. Organ meats, such as liver. Dairy Cream, sour cream, cream cheese, and creamed cottage cheese. Whole-milk cheeses. Whole or 2% milk (liquid, evaporated, or condensed). Whole buttermilk. Cream sauce or high-fat cheese sauce. Whole-milk yogurt. Fats and oils Meat fat, or shortening. Cocoa butter, hydrogenated oils, palm oil, coconut oil, palm kernel oil. Solid fats and shortenings, including bacon fat, salt pork, lard, and butter. Nondairy cream substitutes. Salad dressings with cheese or sour cream. Beverages Regular sodas and any drinks with added sugar. Sweets and desserts Frosting. Pudding. Cookies. Cakes. Pies. Milk chocolate or white chocolate. Buttered syrups. Full-fat ice cream or ice cream drinks. The items listed above may  not be a complete list of foods and beverages to avoid. Contact a dietitian for more information. Summary Heart-healthy meal planning includes limiting unhealthy fats, increasing healthy fats, limiting salt (sodium) intake and making other diet and lifestyle changes. Lose weight if you are overweight. Losing just  5-10% of your body weight can help your overall health and prevent diseases such as diabetes and heart disease. Focus on eating a balance of foods, including fruits and vegetables, low-fat or nonfat dairy, lean protein, nuts and legumes, whole grains, and heart-healthy oils and fats. This information is not intended to replace advice given to you by your health care provider. Make sure you discuss any questions you have with your health care provider. Document Revised: 09/18/2021 Document Reviewed: 09/18/2021 [employer] Patient Education  2024 [employer].

## 2023-12-23 NOTE — Progress Notes (Unsigned)
 Cardiology Clearance received from Dr Gollan. The patient is at acceptable risk for surgery with no further testing. They may hold their Plavix  for 5 days prior to surgery but will need to remain on the Aspirin 81 mg daily. All notes are in Epic.

## 2023-12-25 ENCOUNTER — Ambulatory Visit (INDEPENDENT_AMBULATORY_CARE_PROVIDER_SITE_OTHER): Payer: Self-pay | Admitting: Nurse Practitioner

## 2023-12-25 ENCOUNTER — Encounter: Payer: Self-pay | Admitting: Nurse Practitioner

## 2023-12-25 VITALS — BP 106/54 | HR 57 | Temp 98.5°F | Ht 70.0 in | Wt 180.6 lb

## 2023-12-25 DIAGNOSIS — I7143 Infrarenal abdominal aortic aneurysm, without rupture: Secondary | ICD-10-CM | POA: Diagnosis not present

## 2023-12-25 DIAGNOSIS — E782 Mixed hyperlipidemia: Secondary | ICD-10-CM | POA: Diagnosis not present

## 2023-12-25 DIAGNOSIS — Z6828 Body mass index (BMI) 28.0-28.9, adult: Secondary | ICD-10-CM

## 2023-12-25 DIAGNOSIS — Z0000 Encounter for general adult medical examination without abnormal findings: Secondary | ICD-10-CM

## 2023-12-25 DIAGNOSIS — I2510 Atherosclerotic heart disease of native coronary artery without angina pectoris: Secondary | ICD-10-CM

## 2023-12-25 DIAGNOSIS — M1A0710 Idiopathic chronic gout, right ankle and foot, without tophus (tophi): Secondary | ICD-10-CM

## 2023-12-25 DIAGNOSIS — I10 Essential (primary) hypertension: Secondary | ICD-10-CM | POA: Diagnosis not present

## 2023-12-25 DIAGNOSIS — J3489 Other specified disorders of nose and nasal sinuses: Secondary | ICD-10-CM

## 2023-12-25 DIAGNOSIS — M1000 Idiopathic gout, unspecified site: Secondary | ICD-10-CM

## 2023-12-25 DIAGNOSIS — N400 Enlarged prostate without lower urinary tract symptoms: Secondary | ICD-10-CM | POA: Diagnosis not present

## 2023-12-25 DIAGNOSIS — I358 Other nonrheumatic aortic valve disorders: Secondary | ICD-10-CM

## 2023-12-25 LAB — MICROALBUMIN, URINE WAIVED
Creatinine, Urine Waived: 100 mg/dL (ref 10–300)
Microalb, Ur Waived: 10 mg/L (ref 0–19)
Microalb/Creat Ratio: <30 mg/g (ref <30)

## 2023-12-25 MED ORDER — METOPROLOL SUCCINATE ER 50 MG PO TB24: ORAL_TABLET | ORAL | 3 refills | Status: AC

## 2023-12-25 MED ORDER — ALLOPURINOL 100 MG PO TABS: 100.0000 mg | ORAL_TABLET | Freq: Every day | ORAL | 4 refills | Status: AC

## 2023-12-25 MED ORDER — ROSUVASTATIN CALCIUM 40 MG PO TABS: 40.0000 mg | ORAL_TABLET | Freq: Every day | ORAL | 3 refills | Status: AC

## 2023-12-25 MED ORDER — EZETIMIBE 10 MG PO TABS: 10.0000 mg | ORAL_TABLET | Freq: Every day | ORAL | 3 refills | Status: AC

## 2023-12-25 MED ORDER — LISINOPRIL 20 MG PO TABS: 20.0000 mg | ORAL_TABLET | Freq: Every day | ORAL | 3 refills | Status: AC

## 2023-12-25 MED ORDER — CLOPIDOGREL BISULFATE 75 MG PO TABS
75.0000 mg | ORAL_TABLET | Freq: Every day | ORAL | 3 refills | Status: DC
Start: 2023-12-25 — End: 2024-01-27

## 2023-12-25 NOTE — Assessment & Plan Note (Signed)
Chronic, stable.  Continue current medication regimen and adjust as needed.  Labs today. Refills sent.

## 2023-12-25 NOTE — Assessment & Plan Note (Signed)
 Chronic, ongoing.  Continue current medication regimen and adjust as needed. Lipid panel today.

## 2023-12-25 NOTE — Assessment & Plan Note (Signed)
Chronic, stable.  No recent CP.  Continue current medication regimen and collaboration with cardiology.  Continue Plavix, history of stents.

## 2023-12-25 NOTE — Assessment & Plan Note (Addendum)
 Chronic, stable.  No current medications.  PSA today per patient request.

## 2023-12-25 NOTE — Assessment & Plan Note (Signed)
 Noted on CT scan 11/26/23.  Educated wife and him on this today.  He is taking Crestor and Plavix for history of stents.  Continue these for prevention.  Will recheck via ultrasound in 3 years.

## 2023-12-25 NOTE — Assessment & Plan Note (Signed)
Chronic, ongoing.  Continue collaboration with cardiology and current medications.

## 2023-12-25 NOTE — Progress Notes (Signed)
 BP (!) 106/54   Pulse (!) 57   Temp 98.5 F (36.9 C) (Oral)   Ht 5\' 10"  (1.778 m)   Wt 180 lb 9.6 oz (81.9 kg)   SpO2 96%   BMI 25.91 kg/m    Subjective:    Patient ID: Mark Quinn, male    DOB: 1946/07/22, 78 y.o.   MRN: [id number]  HPI: Mark Quinn is a 78 y.o. male presenting on 12/25/2023 for comprehensive medical exam. Current medical complaints include:none  He currently lives with: wife Interim Problems from his last visit: no  HYPERTENSION / HYPERLIPIDEMIA Takes Lisinopril , Metoprolol , Rosuvastatin , Zetia , Plavix .  History of stents placed >15 years ago. Last saw Dr. Gollan on 09/24/23. AAA noted on imaging April 2025. Satisfied with current treatment? yes Duration of hypertension: chronic BP monitoring frequency: monthly BP range: 120/80 range BP medication side effects: no Duration of hyperlipidemia: chronic Cholesterol medication side effects: no Cholesterol supplements: none Medication compliance: good compliance Aspirin: Plavix  Recent stressors: no Recurrent headaches: no Visual changes: no Palpitations: no Dyspnea: no Chest pain: no Lower extremity edema: no Dizzy/lightheaded: no  GOUT Takes Allopurinol .  Duration:chronic Right 1st metatarsophalangeal pain: yes in past Left 1st metatarsophalangeal pain: no Swelling: no Redness: no Trauma: no Recent dietary change or indiscretion: no Fevers: no Nausea/vomiting: no Aggravating factors: certain foods Alleviating factors: Allopurinol  Status:  stable Treatments attempted: as above  Functional Status Survey: Is the patient deaf or have difficulty hearing?: No Does the patient have difficulty seeing, even when wearing glasses/contacts?: No Does the patient have difficulty concentrating, remembering, or making decisions?: No Does the patient have difficulty walking or climbing stairs?: No Does the patient have difficulty dressing or bathing?: No Does the patient have difficulty doing errands  alone such as visiting a doctor's office or shopping?: No      12/25/2023    1:04 PM 11/05/2023    3:01 PM 11/05/2023    2:54 PM 06/25/2023    3:19 PM 12/03/2022   11:15 AM  Depression screen PHQ 2/9  Decreased Interest 0 0 2 0 0  Down, Depressed, Hopeless 0 0 2 0 0  PHQ - 2 Score 0 0 4 0 0  Altered sleeping 0 0 1 0 0  Tired, decreased energy 0 1 1 0 1  Change in appetite 0 0 0 0 0  Feeling bad or failure about yourself  0 0 1 0 0  Trouble concentrating 0 0 1 0 0  Moving slowly or fidgety/restless 0 0 1 0 0  Suicidal thoughts 0 0 0 0 0  PHQ-9 Score 0 1 9 0 1  Difficult doing work/chores Not difficult at all   Not difficult at all Not difficult at all       12/25/2023    1:04 PM 11/05/2023    3:00 PM 11/05/2023    2:54 PM 06/25/2023    3:19 PM  GAD 7 : Generalized Anxiety Score  Nervous, Anxious, on Edge 0 0 3 0  Control/stop worrying 0 0 3 0  Worry too much - different things 0 0 3 0  Trouble relaxing 0 0 3 0  Restless 0 0 1 0  Easily annoyed or irritable 0 0 3 0  Afraid - awful might happen 0 0 3 0  Total GAD 7 Score 0 0 19 0  Anxiety Difficulty Not difficult at all   Not difficult at all        05/31/2022    8:32 AM  12/03/2022   11:15 AM 12/03/2022   11:28 AM 06/25/2023    3:19 PM 12/25/2023    1:03 PM  Fall Risk  Falls in the past year? 0 0 0 0 0  Was there an injury with Fall? 0 0 0 0 0  Fall Risk Category Calculator 0 0 0 0 0  Fall Risk Category (Retired) Low      Patient at Risk for Falls Due to No Fall Risks No Fall Risks No Fall Risks No Fall Risks No Fall Risks  Fall risk Follow up Falls evaluation completed Falls evaluation completed Falls prevention discussed Falls evaluation completed Falls evaluation completed    Advanced Directives Does patient have a HCPOA?    no If yes, name and contact information:  Does patient have a living will or MOST form?  no  Past Medical History:  Past Medical History:  Diagnosis Date   Anemia    Arthritis    CAD (coronary  artery disease)    Cellulitis    of back   Clotting disorder (HCC)    GERD (gastroesophageal reflux disease)    Gout    Heart murmur    Hypertension    Melanoma (HCC) 06/27/2013   Stage 1 A Excision   Obesity     Surgical History:  Past Surgical History:  Procedure Laterality Date   CARDIAC CATHETERIZATION  2006   [facility]   CORONARY STENT PLACEMENT  2006    CYPHER drug eluting Stent RX 3.50 X 3mm/CYPHER drug eluting Stent RX 3.50 X 28mm    Medications:  Current Outpatient Medications on File Prior to Visit  Medication Sig   allopurinol  (ZYLOPRIM ) 100 MG tablet Take 1 tablet (100 mg total) by mouth daily.   clopidogrel  (PLAVIX ) 75 MG tablet Take 1 tablet (75 mg total) by mouth daily.   ezetimibe  (ZETIA ) 10 MG tablet Take 1 tablet (10 mg total) by mouth daily.   lisinopril  (ZESTRIL ) 20 MG tablet Take 1 tablet (20 mg total) by mouth daily.   meloxicam  (MOBIC ) 15 MG tablet Take 1 tablet every day by oral route.   metoprolol  succinate (TOPROL -XL) 50 MG 24 hr tablet Take with or immediately following a meal.   Multiple Vitamin (MULTIVITAMIN) tablet Take 1 tablet by mouth daily.   rosuvastatin  (CRESTOR ) 40 MG tablet Take 1 tablet (40 mg total) by mouth daily.   No current facility-administered medications on file prior to visit.   Allergies:  Allergies  Allergen Reactions   Other     Seasonal Allergies    Social History:  Social History   Socioeconomic History   Marital status: Married    Spouse name: Not on file   Number of children: Not on file   Years of education: Not on file   Highest education level: Some college, no degree  Occupational History   Not on file  Tobacco Use   Smoking status: Former    Current packs/day: 0.00    Average packs/day: 1 pack/day for 8.0 years (8.0 ttl pk-yrs)    Types: Cigarettes    Start date: 04/13/1967    Quit date: 04/13/1975    Years since quitting: 48.7   Smokeless tobacco: Never  Vaping Use   Vaping status: Never Used   Substance and Sexual Activity   Alcohol use: Yes    Comment: on occasion   Drug use: No   Sexual activity: Not Currently    Birth control/protection: None  Other Topics Concern   Not on file  Social History Narrative   Not on file   Social Drivers of Health   Financial Resource Strain: Medium Risk (11/03/2023)   Overall Financial Resource Strain (CARDIA)    Difficulty of Paying Living Expenses: Somewhat hard  Food Insecurity: Patient Declined (11/03/2023)   Hunger Vital Sign    Worried About Running Out of Food in the Last Year: Patient declined    Ran Out of Food in the Last Year: Patient declined  Transportation Needs: No Transportation Needs (11/03/2023)   PRAPARE - Administrator, Civil Service (Medical): No    Lack of Transportation (Non-Medical): No  Physical Activity: Insufficiently Active (11/03/2023)   Exercise Vital Sign    Days of Exercise per Week: 5 days    Minutes of Exercise per Session: 20 min  Stress: Stress Concern Present (11/03/2023)   [employer] of Occupational Health - Occupational Stress Questionnaire    Feeling of Stress : Rather much  Social Connections: Unknown (11/03/2023)   Social Connection and Isolation Panel [NHANES]    Frequency of Communication with Friends and Family: Three times a week    Frequency of Social Gatherings with Friends and Family: Once a week    Attends Religious Services: Patient declined    Database administrator or Organizations: No    Attends Engineer, structural: Not on file    Marital Status: Married  Catering manager Violence: Not At Risk (12/03/2022)   Humiliation, Afraid, Rape, and Kick questionnaire    Fear of Current or Ex-Partner: No    Emotionally Abused: No    Physically Abused: No    Sexually Abused: No   Social History   Tobacco Use  Smoking Status Former   Current packs/day: 0.00   Average packs/day: 1 pack/day for 8.0 years (8.0 ttl pk-yrs)   Types: Cigarettes   Start date: 04/13/1967    Quit date: 04/13/1975   Years since quitting: 48.7  Smokeless Tobacco Never   Social History   Substance and Sexual Activity  Alcohol Use Yes   Comment: on occasion    Family History:  Family History  Problem Relation Age of Onset   Heart disease Mother    Hyperlipidemia Mother    Diabetes Son    Kidney disease Son     Past medical history, surgical history, medications, allergies, family history and social history reviewed with patient today and changes made to appropriate areas of the chart.   ROS All other ROS negative except what is listed above and in the HPI.      Objective:    BP (!) 106/54   Pulse (!) 57   Temp 98.5 F (36.9 C) (Oral)   Ht 5\' 10"  (1.778 m)   Wt 180 lb 9.6 oz (81.9 kg)   SpO2 96%   BMI 25.91 kg/m   Wt Readings from Last 3 Encounters:  12/25/23 180 lb 9.6 oz (81.9 kg)  12/17/23 178 lb 6.4 oz (80.9 kg)  12/11/23 183 lb 12.8 oz (83.4 kg)    Physical Exam Vitals and nursing note reviewed.  Constitutional:      General: He is awake. He is not in acute distress.    Appearance: He is well-developed and well-groomed. He is not ill-appearing or toxic-appearing.  HENT:     Head: Normocephalic.     Right Ear: Hearing and external ear normal.     Left Ear: Hearing and external ear normal.  Eyes:     General: Lids are normal.  Extraocular Movements: Extraocular movements intact.     Conjunctiva/sclera: Conjunctivae normal.  Neck:     Thyroid : No thyromegaly.     Vascular: No carotid bruit.  Cardiovascular:     Rate and Rhythm: Regular rhythm. Bradycardia present.     Heart sounds: Normal heart sounds. No murmur heard.    No gallop.  Pulmonary:     Effort: No accessory muscle usage or respiratory distress.     Breath sounds: Normal breath sounds.  Abdominal:     General: Bowel sounds are normal. There is no distension.     Palpations: Abdomen is soft.     Tenderness: There is no abdominal tenderness.  Musculoskeletal:     Cervical  back: Full passive range of motion without pain.     Right lower leg: No edema.     Left lower leg: No edema.  Lymphadenopathy:     Cervical: No cervical adenopathy.  Skin:    General: Skin is warm.     Capillary Refill: Capillary refill takes less than 2 seconds.     Findings: Lesion present.     Comments: Photo attached below of lesion to lateral right nose.  Pearly color, approx 1 cm.  Neurological:     Mental Status: He is alert and oriented to person, place, and time.     Deep Tendon Reflexes: Reflexes are normal and symmetric.     Reflex Scores:      Brachioradialis reflexes are 2+ on the right side and 2+ on the left side.      Patellar reflexes are 2+ on the right side and 2+ on the left side. Psychiatric:        Attention and Perception: Attention normal.        Mood and Affect: Mood normal.        Speech: Speech normal.        Behavior: Behavior normal. Behavior is cooperative.        Thought Content: Thought content normal.           12/03/2022   11:30 AM  6CIT Screen  What Year? 0 points  What month? 0 points  What time? 0 points  Count back from 20 0 points  Months in reverse 0 points  Repeat phrase 0 points  Total Score 0 points   Results for orders placed or performed in visit on 06/25/23  Comprehensive metabolic panel   Collection Time: 06/25/23  3:33 PM  Result Value Ref Range   Glucose 93 70 - 99 mg/dL   BUN 23 8 - 27 mg/dL   Creatinine, Ser 0.45 (H) 0.76 - 1.27 mg/dL   eGFR 54 (L) >40 JW/JXB/1.47   BUN/Creatinine Ratio 17 10 - 24   Sodium 139 134 - 144 mmol/L   Potassium 4.5 3.5 - 5.2 mmol/L   Chloride 104 96 - 106 mmol/L   CO2 21 20 - 29 mmol/L   Calcium  9.4 8.6 - 10.2 mg/dL   Total Protein 6.2 6.0 - 8.5 g/dL   Albumin 4.1 3.8 - 4.8 g/dL   Globulin, Total 2.1 1.5 - 4.5 g/dL   Bilirubin Total 0.4 0.0 - 1.2 mg/dL   Alkaline Phosphatase 48 44 - 121 IU/L   AST 38 0 - 40 IU/L   ALT 32 0 - 44 IU/L  Lipid Panel w/o Chol/HDL Ratio   Collection  Time: 06/25/23  3:33 PM  Result Value Ref Range   Cholesterol, Total 120 100 - 199 mg/dL   Triglycerides  205 (H) 0 - 149 mg/dL   HDL 44 >16 mg/dL   VLDL Cholesterol Cal 33 5 - 40 mg/dL   LDL Chol Calc (NIH) 43 0 - 99 mg/dL      Assessment & Plan:   Problem List Items Addressed This Visit       Cardiovascular and Mediastinum   Essential hypertension, benign   Chronic, ongoing.  BP well below goal in office. Recommend he monitor BP at least a few mornings a week at home and document.  DASH diet at home.  Continue current medication regimen and adjust as needed.  Labs today: CBC, TSH CMP, urine ALB.  Urine ALB 25 December 2022, continue Lisinopril  for kidney protection.  Return in 6 months.       Relevant Orders   Microalbumin, Urine Waived   CBC with Differential/Platelet   Comprehensive metabolic panel with GFR   TSH   CAD (coronary artery disease)   Chronic, stable.  No recent CP.  Continue current medication regimen and collaboration with cardiology.  Continue Plavix , history of stents.      Relevant Orders   Comprehensive metabolic panel with GFR   Lipid Panel w/o Chol/HDL Ratio   Aortic valve sclerosis   Chronic, ongoing.  Continue collaboration with cardiology and current medications.        Relevant Orders   Comprehensive metabolic panel with GFR   Lipid Panel w/o Chol/HDL Ratio   Abdominal aortic aneurysm (AAA) (HCC) - Primary   Noted on CT scan 11/26/23.  Educated wife and him on this today.  He is taking Crestor  and Plavix  for history of stents.  Continue these for prevention.  Will recheck via ultrasound in 3 years.      Relevant Orders   Comprehensive metabolic panel with GFR   Lipid Panel w/o Chol/HDL Ratio     Genitourinary   BPH (benign prostatic hyperplasia)   Chronic, stable.  No current medications.  PSA today per patient request.      Relevant Orders   PSA     Other   Hyperlipemia   Chronic, ongoing.  Continue current medication regimen and adjust  as needed.  Lipid panel today.         Relevant Orders   Comprehensive metabolic panel with GFR   Lipid Panel w/o Chol/HDL Ratio   Gout   Chronic, stable.  Continue current medication regimen and adjust as needed.  Labs today. Refills sent.      Relevant Orders   Uric acid   Other Visit Diagnoses       Lesion of nose       Referral to dermatology, suspect basal cell.   Relevant Orders   Ambulatory referral to Dermatology     Encounter for annual physical exam       Annual physical today with labs and health maintenance reviewed, discussed with patient.       LABORATORY TESTING:  Health maintenance labs ordered today as discussed above.   IMMUNIZATIONS:   - Tdap: Tetanus vaccination status reviewed: last tetanus booster within 10 years. - Influenza: Up to date - Pneumovax: Up to date - Prevnar: Up to date - Zostavax vaccine: Up To Date  SCREENING: - Colonoscopy: Not Up to date  --- is 73 and wishes to hold off Discussed with patient purpose of the colonoscopy is to detect colon cancer at curable precancerous or early stages   - AAA Screening: Not applicable  -Hearing Test: Not applicable  -Spirometry: Not applicable  PATIENT COUNSELING:    Sexuality: Discussed sexually transmitted diseases, partner selection, use of condoms, avoidance of unintended pregnancy  and contraceptive alternatives.   Advised to avoid cigarette smoking.  I discussed with the patient that most people either abstain from alcohol or drink within safe limits (<=14/week and <=4 drinks/occasion for males, <=7/weeks and <= 3 drinks/occasion for females) and that the risk for alcohol disorders and other health effects rises proportionally with the number of drinks per week and how often a drinker exceeds daily limits.  Discussed cessation/primary prevention of drug use and availability of treatment for abuse.   Diet: Encouraged to adjust caloric intake to maintain  or achieve ideal body weight, to  reduce intake of dietary saturated fat and total fat, to limit sodium intake by avoiding high sodium foods and not adding table salt, and to maintain adequate dietary potassium and calcium  preferably from fresh fruits, vegetables, and low-fat dairy products.    Stressed the importance of regular exercise  Injury prevention: Discussed safety belts, safety helmets, smoke detector, smoking near bedding or upholstery.   Dental health: Discussed importance of regular tooth brushing, flossing, and dental visits.   Follow up plan: NEXT PREVENTATIVE PHYSICAL DUE IN 1 YEAR. Return in about 6 months (around 06/25/2024) for HTN/HLD.

## 2023-12-25 NOTE — Assessment & Plan Note (Signed)
 Chronic, ongoing.  BP well below goal in office. Recommend he monitor BP at least a few mornings a week at home and document.  DASH diet at home.  Continue current medication regimen and adjust as needed.  Labs today: CBC, TSH CMP, urine ALB.  Urine ALB 25 December 2022, continue Lisinopril  for kidney protection.  Return in 6 months.

## 2023-12-26 ENCOUNTER — Encounter: Payer: Self-pay | Admitting: Nurse Practitioner

## 2023-12-26 LAB — COMPREHENSIVE METABOLIC PANEL WITH GFR
ALT: 36 IU/L (ref 0–44)
AST: 33 IU/L (ref 0–40)
Albumin: 4.4 g/dL (ref 3.8–4.8)
Alkaline Phosphatase: 50 IU/L (ref 44–121)
BUN/Creatinine Ratio: 14 (ref 10–24)
BUN: 13 mg/dL (ref 8–27)
Bilirubin Total: 0.4 mg/dL (ref 0.0–1.2)
CO2: 25 mmol/L (ref 20–29)
Calcium: 10 mg/dL (ref 8.6–10.2)
Chloride: 102 mmol/L (ref 96–106)
Creatinine, Ser: 0.96 mg/dL (ref 0.76–1.27)
Globulin, Total: 2.4 g/dL (ref 1.5–4.5)
Glucose: 90 mg/dL (ref 70–99)
Potassium: 4.4 mmol/L (ref 3.5–5.2)
Sodium: 139 mmol/L (ref 134–144)
Total Protein: 6.8 g/dL (ref 6.0–8.5)
eGFR: 82 mL/min/{1.73_m2} (ref >59)

## 2023-12-26 LAB — CBC WITH DIFFERENTIAL/PLATELET
Basophils Absolute: 0 10*3/uL (ref 0.0–0.2)
Basos: 0 %
EOS (ABSOLUTE): 0.3 10*3/uL (ref 0.0–0.4)
Eos: 4 %
Hematocrit: 37.3 % — ABNORMAL LOW (ref 37.5–51.0)
Hemoglobin: 12.1 g/dL — ABNORMAL LOW (ref 13.0–17.7)
Immature Grans (Abs): 0 10*3/uL (ref 0.0–0.1)
Immature Granulocytes: 0 %
Lymphocytes Absolute: 2.4 10*3/uL (ref 0.7–3.1)
Lymphs: 33 %
MCH: 31.6 pg (ref 26.6–33.0)
MCHC: 32.4 g/dL (ref 31.5–35.7)
MCV: 97 fL (ref 79–97)
Monocytes Absolute: 0.5 10*3/uL (ref 0.1–0.9)
Monocytes: 8 %
Neutrophils Absolute: 4 10*3/uL (ref 1.4–7.0)
Neutrophils: 55 %
Platelets: 285 10*3/uL (ref 150–450)
RBC: 3.83 x10E6/uL — ABNORMAL LOW (ref 4.14–5.80)
RDW: 13.1 % (ref 11.6–15.4)
WBC: 7.2 10*3/uL (ref 3.4–10.8)

## 2023-12-26 LAB — PSA: Prostate Specific Ag, Serum: 1 ng/mL (ref 0.0–4.0)

## 2023-12-26 LAB — LIPID PANEL W/O CHOL/HDL RATIO
Cholesterol, Total: 134 mg/dL (ref 100–199)
HDL: 43 mg/dL (ref >39)
LDL Chol Calc (NIH): 56 mg/dL (ref 0–99)
Triglycerides: 219 mg/dL — ABNORMAL HIGH (ref 0–149)
VLDL Cholesterol Cal: 35 mg/dL (ref 5–40)

## 2023-12-26 LAB — URIC ACID: Uric Acid: 5.7 mg/dL (ref 3.8–8.4)

## 2023-12-26 LAB — TSH: TSH: 1.12 u[IU]/mL (ref 0.450–4.500)

## 2023-12-26 NOTE — Progress Notes (Signed)
 Contacted via MyChart   Good afternoon Ronny, your labs have returned: - CBC is showing slightly low hemoglobin and hematocrit again, but this is mild and we can monitor closely. - Kidney function, creatinine and eGFR, remains normal, as is liver function, AST and ALT.  - Lipid panel shows LDL at goal, continue statin and Zetia . - Remainder of labs are all stable.  Any questions? Keep being amazing!!  Thank you for allowing me to participate in your care.  I appreciate you. Kindest regards, Marlean Mortell

## 2024-01-09 ENCOUNTER — Encounter: Payer: Self-pay | Admitting: General Surgery

## 2024-01-09 ENCOUNTER — Ambulatory Visit: Admitting: General Surgery

## 2024-01-09 VITALS — BP 112/65 | HR 54 | Temp 98.2°F | Ht 70.0 in | Wt 179.0 lb

## 2024-01-09 DIAGNOSIS — K4090 Unilateral inguinal hernia, without obstruction or gangrene, not specified as recurrent: Secondary | ICD-10-CM

## 2024-01-09 NOTE — Patient Instructions (Signed)
  Please call and ask to speak with a nurse if you develop questions or concerns.

## 2024-01-16 ENCOUNTER — Ambulatory Visit: Payer: Self-pay | Admitting: General Surgery

## 2024-01-20 ENCOUNTER — Ambulatory Visit: Payer: Self-pay | Admitting: General Surgery

## 2024-01-20 NOTE — Progress Notes (Signed)
 Patient ID: Mark Quinn, male   DOB: 1945/09/16, 78 y.o.   MRN: [id number] CC: Right Inguinal Hernia History of Present Illness Mark Quinn is a 78 y.o. male with right inguinal hernia.  The patient returns today for preoperative appointment.  He has undergone cardiac risk stratification and he is at acceptable risk for surgery.  He will need to hold his Plavix  5 days prior to the procedure.  He continues to have pain in his right groin that radiates down to the inside of his thigh.  He also continues to have a bulge.  He has not had any obstructive symptoms nor has he had any overlying skin changes or irreducibility of his hernia..   Past Medical History:  Diagnosis Date   Anemia    Arthritis    CAD (coronary artery disease)    Cellulitis    of back   Clotting disorder (HCC)    GERD (gastroesophageal reflux disease)    Gout    Heart murmur    Hypertension    Melanoma (HCC) 06/27/2013   Stage 1 A Excision   Obesity       Past Surgical History:  Procedure Laterality Date   CARDIAC CATHETERIZATION  2006   [facility]   CORONARY STENT PLACEMENT  2006    CYPHER drug eluting Stent RX 3.50 X 38mm/CYPHER drug eluting Stent RX 3.50 X 28mm    Allergies  Allergen Reactions   Other     Seasonal Allergies    Current Outpatient Medications  Medication Sig Dispense Refill   allopurinol  (ZYLOPRIM ) 100 MG tablet Take 1 tablet (100 mg total) by mouth daily. 90 tablet 4   clopidogrel  (PLAVIX ) 75 MG tablet Take 1 tablet (75 mg total) by mouth daily. 90 tablet 3   ezetimibe  (ZETIA ) 10 MG tablet Take 1 tablet (10 mg total) by mouth daily. 90 tablet 3   lisinopril  (ZESTRIL ) 20 MG tablet Take 1 tablet (20 mg total) by mouth daily. 90 tablet 3   meloxicam  (MOBIC ) 15 MG tablet Take 1 tablet every day by oral route.     metoprolol  succinate (TOPROL -XL) 50 MG 24 hr tablet Take with or immediately following a meal. 90 tablet 3   Multiple Vitamin (MULTIVITAMIN) tablet Take 1 tablet by mouth daily.      rosuvastatin  (CRESTOR ) 40 MG tablet Take 1 tablet (40 mg total) by mouth daily. 90 tablet 3   No current facility-administered medications for this visit.    Family History Family History  Problem Relation Age of Onset   Heart disease Mother    Hyperlipidemia Mother    Diabetes Son    Kidney disease Son        Social History Social History   Tobacco Use   Smoking status: Former    Current packs/day: 0.00    Average packs/day: 1 pack/day for 8.0 years (8.0 ttl pk-yrs)    Types: Cigarettes    Start date: 04/13/1967    Quit date: 04/13/1975    Years since quitting: 48.8    Passive exposure: Past   Smokeless tobacco: Never  Vaping Use   Vaping status: Never Used  Substance Use Topics   Alcohol use: Yes    Comment: on occasion   Drug use: No        ROS Full ROS of systems performed and is otherwise negative there than what is stated in the HPI  Physical Exam Blood pressure 112/65, pulse (!) 54, temperature 98.2 F (36.8 C), height 5'  10" (1.778 m), weight 179 lb (81.2 kg), SpO2 97%.  No acute distress, alert and oriented x 3, normal work of breathing on room air, regular rate and rhythm, clear to auscultation bilaterally, abdomen is soft, nontender nondistended.  Right groin with obvious bulge on standing and Valsalva that is easily reducible.  This is on the right side but there is no evidence of hernia on the left side.  Data Reviewed I reviewed his ultrasound and CT that both show a right inguinal hernia that contains fat.  I have personally reviewed the patient's imaging and medical records.    Assessment/Plan    78 year old male with past medical history significant for coronary artery disease who presents in consultation for right inguinal hernia.  This is easily reducible.  The patient is going on vacation in May and I recommended that he have this repaired after this.  He will need to hold his Plavix  for 5 days prior to the procedure.  I again discussed the  risk, benefits alternatives of the procedure including risk of infection, bleeding damage to the vas deferens, chronic pain and recurrence.  He understands his risk and wishes to proceed.    Barrett Lick

## 2024-01-21 ENCOUNTER — Encounter: Payer: Self-pay | Admitting: General Surgery

## 2024-01-21 DIAGNOSIS — K4090 Unilateral inguinal hernia, without obstruction or gangrene, not specified as recurrent: Secondary | ICD-10-CM

## 2024-01-22 ENCOUNTER — Other Ambulatory Visit: Payer: Self-pay

## 2024-01-22 ENCOUNTER — Encounter
Admission: RE | Admit: 2024-01-22 | Discharge: 2024-01-22 | Disposition: A | Discharge status: Home / Self Care | Source: Ambulatory Visit | Attending: General Surgery | Admitting: General Surgery

## 2024-01-22 ENCOUNTER — Ambulatory Visit: Payer: Self-pay | Admitting: General Surgery

## 2024-01-22 VITALS — Ht 70.0 in | Wt 179.0 lb

## 2024-01-22 DIAGNOSIS — Z01812 Encounter for preprocedural laboratory examination: Secondary | ICD-10-CM

## 2024-01-22 DIAGNOSIS — K4090 Unilateral inguinal hernia, without obstruction or gangrene, not specified as recurrent: Secondary | ICD-10-CM

## 2024-01-22 HISTORY — DX: Unilateral inguinal hernia, without obstruction or gangrene, not specified as recurrent: K40.90

## 2024-01-22 NOTE — Pre-Procedure Instructions (Signed)
 Since surgeon stated via secure chat that patient is doing right inguinal hernia, patient does not need Blood Type and Screen.

## 2024-01-22 NOTE — Patient Instructions (Addendum)
 Your procedure is scheduled on: 01/27/2024 Monday Report to the Registration Desk on the 1st floor of the [facility]. To find out your arrival time, please call [phone] between 1PM - 3PM on: Friday, May 30  If your arrival time is 6:00 am, do not arrive before that time as the [facility] entrance doors do not open until 6:00 am.  REMEMBER: Instructions that are not followed completely may result in serious medical risk, up to and including death; or upon the discretion of your surgeon and anesthesiologist your surgery may need to be rescheduled.  Do not eat food after midnight the night before surgery.  No gum chewing or hard candies.   One week prior to surgery: Stop Anti-inflammatories (NSAIDS) such as Advil, Aleve, Ibuprofen, Motrin, Naproxen, Naprosyn and Aspirin based products such as Excedrin, Goody's Powder, BC Powder and mobic  Stop ANY OVER THE COUNTER supplements until after surgery like Multivitamin.  You may however, continue to take Tylenol if needed for pain up until the day of surgery.    Please follow the instructions given by your Cardiologist to hold Plavix  5 days prior to the procedure. Last dose should be have been Tuesday, May 27.    Continue taking all of your other prescription medications up until the day of surgery.  ON THE DAY OF SURGERY ONLY TAKE THESE MEDICATIONS WITH SIPS OF WATER:  allopurinol  (ZYLOPRIM ) ezetimibe  (ZETIA ) metoprolol  succinate (TOPROL -XL) rosuvastatin  (CRESTOR )  Do not take your Lisinopril  (Zestril ) on day of surgery.     No Alcohol for 24 hours before or after surgery.  No Smoking including e-cigarettes for 24 hours before surgery.  No chewable tobacco products for at least 6 hours before surgery.  No nicotine patches on the day of surgery.  Do not use any "recreational" drugs for at least a week (preferably 2 weeks) before your surgery.  Please be advised that the combination of cocaine and anesthesia may have  negative outcomes, up to and including death. If you test positive for cocaine, your surgery will be cancelled.  On the morning of surgery brush your teeth with toothpaste and water, you may rinse your mouth with mouthwash if you wish. Do not swallow any toothpaste or mouthwash.  Use CHG Soap or wipes as directed on instruction sheet.- provided for you  Do not wear jewelry, make-up, hairpins, clips or nail polish.  Do not wear lotions, powders, or perfumes.   Do not shave body hair from the neck down 48 hours before surgery.  Contact lenses, hearing aids and dentures may not be worn into surgery.  Do not bring valuables to the hospital. [facility] is not responsible for any missing/lost belongings or valuables.    Notify your doctor if there is any change in your medical condition (cold, fever, infection).  Wear comfortable clothing (specific to your surgery type) to the hospital.  After surgery, you can help prevent lung complications by doing breathing exercises.  Take deep breaths and cough every 1-2 hours. Your doctor may order a device called an Incentive Spirometer to help you take deep breaths. When coughing or sneezing, hold a pillow firmly against your incision with both hands. This is called "splinting." Doing this helps protect your incision. It also decreases belly discomfort.  If you are being admitted to the hospital overnight, leave your suitcase in the car. After surgery it may be brought to your room.  If you are being discharged the day of surgery, you will not be allowed to  drive home. You will need a responsible individual to drive you home and stay with you for 24 hours after surgery.    Please call the Pre-admissions Testing Dept. at [phone] if you have any questions about these instructions.  Surgery Visitation Policy:  Patients having surgery or a procedure may have two visitors.  Children under the age of 74 must have an adult with them who is  not the patient.  Inpatient Visitation:    Visiting hours are 7 a.m. to 8 p.m. Up to four visitors are allowed at one time in a patient room. The visitors may rotate out with other people during the day.  One visitor age 50 or older may stay with the patient overnight and must be in the room by 8 p.m.      Preparing for Surgery with CHLORHEXIDINE GLUCONATE (CHG) Soap  Chlorhexidine Gluconate (CHG) Soap  o An antiseptic cleaner that kills germs and bonds with the skin to continue killing germs even after washing  o Used for showering the night before surgery and morning of surgery  Before surgery, you can play an important role by reducing the number of germs on your skin.  CHG (Chlorhexidine gluconate) soap is an antiseptic cleanser which kills germs and bonds with the skin to continue killing germs even after washing.  Please do not use if you have an allergy to CHG or antibacterial soaps. If your skin becomes reddened/irritated stop using the CHG.  1. Shower the NIGHT BEFORE SURGERY and the MORNING OF SURGERY with CHG soap.  2. If you choose to wash your hair, wash your hair first as usual with your normal shampoo.  3. After shampooing, rinse your hair and body thoroughly to remove the shampoo.  4. Use CHG as you would any other liquid soap. You can apply CHG directly to the skin and wash gently with a scrungie or a clean washcloth.  5. Apply the CHG soap to your body only from the neck down. Do not use on open wounds or open sores. Avoid contact with your eyes, ears, mouth, and genitals (private parts). Wash face and genitals (private parts) with your normal soap.  6. Wash thoroughly, paying special attention to the area where your surgery will be performed.  7. Thoroughly rinse your body with warm water.  8. Do not shower/wash with your normal soap after using and rinsing off the CHG soap.  9. Pat yourself dry with a clean towel.  10. Wear clean pajamas to bed the night  before surgery.  12. Place clean sheets on your bed the night of your first shower and do not sleep with pets.  13. Shower again with the CHG soap on the day of surgery prior to arriving at the hospital.  14. Do not apply any deodorants/lotions/powders.  15. Please wear clean clothes to the hospital.

## 2024-01-24 ENCOUNTER — Encounter: Payer: Self-pay | Admitting: General Surgery

## 2024-01-24 ENCOUNTER — Inpatient Hospital Stay: Admission: RE | Admit: 2024-01-24 | Source: Ambulatory Visit

## 2024-01-24 NOTE — Progress Notes (Cosign Needed)
 Perioperative / Anesthesia Services  Pre-Admission Testing Clinical Review / Pre-Operative Anesthesia Consult  Date: 01/24/24  PATIENT DEMOGRAPHICS: Name: Mark Quinn DOB: [birth year] MRN:   [id number]  Note: Available PAT nursing documentation and vital signs have been reviewed. Clinical nursing staff has updated patient's PMH/PSHx, current medication list, and drug allergies/intolerances to ensure complete and comprehensive history available to assist care teams in MDM as it pertains to the aforementioned surgical procedure and anticipated anesthetic course. Extensive review of available clinical information personally performed. [place] PMH and PSHx updated with any diagnoses/procedures that  may have been inadvertently omitted during his intake with the pre-admission testing department's nursing staff.  PLANNED SURGICAL PROCEDURE(S):    Case: [id number] Date/Time: 01/27/24 0715   Procedure: HERNIORRHAPHY, INGUINAL, ROBOT-ASSISTED, LAPAROSCOPIC (Right: Inguinal)   Anesthesia type: General   Diagnosis: Non-recurrent unilateral inguinal hernia with obstruction without gangrene [K40.30]   Pre-op diagnosis: initial reducible right inguinal hernia   Location: [facility] OR ROOM 07 / [facility] ORS FOR ANESTHESIA GROUP   Surgeons: Barrett Lick, MD     CLINICAL DISCUSSION: Mark Quinn is a 78 y.o. male who is submitted for pre-surgical anesthesia review and clearance prior to him undergoing the above procedure. Patient is a Former Smoker (8 pack years; quit 03/1975). Pertinent PMH includes: CAD, aortic stenosis, infrarenal AAA, aortic root and ascending aorta dilatation, angina, cardiac murmur, HTN, HLD, GERD (no daily Tx), RIGHT inguinal hernia, anemia, nephrolithiasis, OA, lumbar DDD.  Patient is followed by cardiology (Gollan, MD). He was last seen in the cardiology clinic on 09/24/2023; notes reviewed. At the time of his clinic visit, patient doing well overall from a cardiovascular  perspective. Patient denied any chest pain, shortness of breath, PND, orthopnea, palpitations, significant peripheral edema, weakness, fatigue, vertiginous symptoms, or presyncope/syncope. Patient with a past medical history significant for cardiovascular diagnoses. Documented physical exam was grossly benign, providing no evidence of acute exacerbation and/or decompensation of the patient's known cardiovascular conditions.  Patient underwent diagnostic LEFT heart catheterization on 12/07/2004 revealing multivessel CAD; 70% mid RCA, 20% mid LM, 40% proximal LAD, and 60% distal LAD.  Additionally, there was 99% thrombotic occlusion of the LCx.  Given the degree and complexity of patient's coronary artery disease, patient was transferred to [facility] for ongoing care and management.  On arrival to [facility], patient underwent complex PCI placing  3.5 x 23 mm and 3.5 x 13 mm Cypher DES x 2 to the proximal RCA.  Additionally, a 4.0 x 12 mm Driver BMS x 1 was placed to the ramus intermedius.  Procedure yielded excellent angiographic result and TIMI-3 flow.  Patient underwent repeat diagnostic LEFT heart catheterization on 09/10/2008.  Procedure revealed multivessel CAD; 50% proximal LAD, and 40% distal RCA.  Previously placed stents widely patent.  Given the nonobstructive nature of patient's coronary artery disease, the decision was made to defer further intervention opting for medical management.  Most recent TTE performed on 03/20/2018 revealed a normal left ventricular systolic function with an EF of 60-65%. There were no regional wall motion abnormalities.  Left ventricular diastolic Doppler parameters were normal.  Left atrium was mildly dilated.  Right ventricular size and function normal with a TAPSE measuring 3.09 cm  (normal range >/= 1.6 cm).  RVSP within normal range.  There was mild mitral valve regurgitation.  Very mild aortic valve stenosis was noted; mean pressure gradient = 10 mmHg;  AVA (VTI) = 2.03 cm.  All other transvalvular gradients were noted to be  normal providing no evidence of valvular stenosis.  Aortic root mildly dilated at 3.8 cm.  Ascending aorta mildly dilated 3.6 cm.  Recent CT imaging of the abdomen pelvis performed on 11/25/2023 made an incidental finding of a 3.4 x 3.3 infrarenal abdominal aortic aneurysm with atheromatous changes of the infrarenal abdominal aorta and aortic bifurcation.  Given patient's history of coronary artery disease and placement of an older generation bare-metal stent, patient remains on daily antithrombotic therapy using clopidogrel .  Patient reportedly compliant with therapy with no evidence reports of GI/GU related bleeding. Blood pressure well controlled at 110/80 mmHg on currently prescribed ACEi (lisinopril ) and beta-blocker (metoprolol  succinate) therapies.  Patient is on rosuvastatin  + ezetimibe  for his HLD diagnosis and ASCVD prevention. Patient is not diabetic. He does not have an OSAH diagnosis. Patient is able to complete all of his  ADL/IADLs without cardiovascular limitation.  Per the DASI, patient is able to achieve at least 4 METS of physical activity without experiencing any significant degree of angina/anginal equivalent symptoms. No changes were made to his medication regimen during his visit with cardiology.  Patient scheduled to follow-up with outpatient cardiology in 1 year or sooner if needed.  [name] is scheduled for an elective HERNIORRHAPHY, INGUINAL, ROBOT-ASSISTED, LAPAROSCOPIC (Right: Inguinal) on 01/27/2024 with Dr. Severa Daniels, DO.  Given patient's past medical history significant for cardiovascular diagnoses, presurgical cardiac clearance was sought by the PAT team. Per cardiology, "based ACC/AHA guidelines, the patient's past medical history, and the amount of time since his last clinic visit, this patient would be at an overall ACCEPTABLE risk for the planned procedure without further cardiovascular  testing or intervention at this time".   Again, this patient is on daily antithrombotic therapy. He has been instructed on recommendations for holding his clopidogrel  for 5 days prior to his procedure with plans to restart as soon as postoperative bleeding risk felt to be minimized by his primary attending surgeon. The patient has been instructed that his last dose of should be on 01/21/2024.  Patient denies previous perioperative complications with anesthesia in the past. In review his EMR, there are no records available for review pertaining to any anesthetic courses within the [facility] in the recent past.    MOST RECENT VITAL SIGNS:    01/22/2024    8:52 AM 01/09/2024    9:41 AM 12/25/2023   12:58 PM  Vitals with BMI  Height 5\' 10"  5\' 10"  5\' 10"   Weight 179 lbs 179 lbs 180 lbs 10 oz  BMI 25.68 25.68 25.91  Systolic  112 106  Diastolic  65 54  Pulse  54 57   PROVIDERS/SPECIALISTS: NOTE: Primary physician provider listed below. Patient may have been seen by APP or partner within same practice.   PROVIDER ROLE / SPECIALTY LAST OV  Barrett Lick, MD General Surgery (Surgeon) 01/09/2024  Lemar Pyles, NP Primary Care Provider 12/25/2023  Belva Boyden, MD Cardiology 11/05/2023; preop APP call 12/20/2023   ALLERGIES: Allergies  Allergen Reactions   Other     Seasonal Allergies   CURRENT HOME MEDICATIONS: No current facility-administered medications for this encounter.    allopurinol  (ZYLOPRIM ) 100 MG tablet   clopidogrel  (PLAVIX ) 75 MG tablet   ezetimibe  (ZETIA ) 10 MG tablet   lisinopril  (ZESTRIL ) 20 MG tablet   meloxicam  (MOBIC ) 15 MG tablet   metoprolol  succinate (TOPROL -XL) 50 MG 24 hr tablet   Multiple Vitamin (MULTIVITAMIN) tablet   rosuvastatin  (CRESTOR ) 40 MG tablet   HISTORY: Past  Medical History:  Diagnosis Date   Anemia    Angina pectoris (HCC)    Aortic stenosis    Arthritis    CAD (coronary artery disease) 12/07/2004   a.) LHC/PCI  12/07/2004: 95% pRCA (3.5 x 23 mm and 3.5 x 13 mm Cypher DES), 95% RI (4.0 x 12 mm Driver BMS)   GERD (gastroesophageal reflux disease)    Gout    Heart murmur    HLD (hyperlipidemia)    Hypertension    Infrarenal abdominal aortic aneurysm (AAA) without rupture (HCC)    Inguinal hernia, right    Long term current use of clopidogrel     Melanoma (HCC) 06/27/2013   Stage 1 A Excision   Obesity    Past Surgical History:  Procedure Laterality Date   CORONARY STENT PLACEMENT Left 12/07/2004   Procedure: CORONARY STENT PLACEMENT; Location: [facility]; Surgeon: Candida Chalk, MD   LEFT HEART CATH AND CORONARY ANGIOGRAPHY Left 12/07/2004   Procedure: LEFT HEART CATH AND CORONARY ANGIOGRAPHY; Location: [facility]; Surgeon: Debborah Fairly, MD   LEFT HEART CATH AND CORONARY ANGIOGRAPHY Left 09/10/2008   Procedure: LEFT HEART CATH AND CORONARY ANGIOGRAPHY; Location: [facility]; Surgeon: Debborah Fairly, MD   Family History  Problem Relation Age of Onset   Heart disease Mother    Hyperlipidemia Mother    Diabetes Son    Kidney disease Son    Social History   Tobacco Use   Smoking status: Former    Current packs/day: 0.00    Average packs/day: 1 pack/day for 8.0 years (8.0 ttl pk-yrs)    Types: Cigarettes    Start date: 04/13/1967    Quit date: 04/13/1975    Years since quitting: 48.8    Passive exposure: Past   Smokeless tobacco: Never  Substance Use Topics   Alcohol use: Yes    Comment: on occasion   LABS:  No visits with results within 30 Day(s) from this visit.  Latest known visit with results is:  Office Visit on 12/25/2023  Component Date Value Ref Range Status   Microalb, Ur Waived 12/25/2023 10  0 - 19 mg/L Final   Creatinine, Urine Waived 12/25/2023 100  10 - 300 mg/dL Final   Microalb/Creat Ratio 12/25/2023 <30  <30 mg/g Final   Comment:                              Abnormal:       30 - 300                         High Abnormal:           >300    WBC 12/25/2023 7.2  3.4 - 10.8 x10E3/uL  Final   RBC 12/25/2023 3.83 (L)  4.14 - 5.80 x10E6/uL Final   Hemoglobin 12/25/2023 12.1 (L)  13.0 - 17.7 g/dL Final   Hematocrit 95/28/4132 37.3 (L)  37.5 - 51.0 % Final   MCV 12/25/2023 97  79 - 97 fL Final   MCH 12/25/2023 31.6  26.6 - 33.0 pg Final   MCHC 12/25/2023 32.4  31.5 - 35.7 g/dL Final   RDW 44/08/270 13.1  11.6 - 15.4 % Final   Platelets 12/25/2023 285  150 - 450 x10E3/uL Final   Neutrophils 12/25/2023 55  Not Estab. % Final   Lymphs 12/25/2023 33  Not Estab. % Final   Monocytes 12/25/2023 8  Not Estab. % Final  Eos 12/25/2023 4  Not Estab. % Final   Basos 12/25/2023 0  Not Estab. % Final   Neutrophils Absolute 12/25/2023 4.0  1.4 - 7.0 x10E3/uL Final   Lymphocytes Absolute 12/25/2023 2.4  0.7 - 3.1 x10E3/uL Final   Monocytes Absolute 12/25/2023 0.5  0.1 - 0.9 x10E3/uL Final   EOS (ABSOLUTE) 12/25/2023 0.3  0.0 - 0.4 x10E3/uL Final   Basophils Absolute 12/25/2023 0.0  0.0 - 0.2 x10E3/uL Final   Immature Granulocytes 12/25/2023 0  Not Estab. % Final   Immature Grans (Abs) 12/25/2023 0.0  0.0 - 0.1 x10E3/uL Final   Glucose 12/25/2023 90  70 - 99 mg/dL Final   BUN 47/42/5956 13  8 - 27 mg/dL Final   Creatinine, Ser 12/25/2023 0.96  0.76 - 1.27 mg/dL Final   eGFR 38/75/6433 82  >59 mL/min/1.73 Final   BUN/Creatinine Ratio 12/25/2023 14  10 - 24 Final   Sodium 12/25/2023 139  134 - 144 mmol/L Final   Potassium 12/25/2023 4.4  3.5 - 5.2 mmol/L Final   Chloride 12/25/2023 102  96 - 106 mmol/L Final   CO2 12/25/2023 25  20 - 29 mmol/L Final   Calcium  12/25/2023 10.0  8.6 - 10.2 mg/dL Final   Total Protein 29/51/8841 6.8  6.0 - 8.5 g/dL Final   Albumin 66/01/3015 4.4  3.8 - 4.8 g/dL Final   Globulin, Total 12/25/2023 2.4  1.5 - 4.5 g/dL Final   Bilirubin Total 12/25/2023 0.4  0.0 - 1.2 mg/dL Final   Alkaline Phosphatase 12/25/2023 50  44 - 121 IU/L Final   AST 12/25/2023 33  0 - 40 IU/L Final   ALT 12/25/2023 36  0 - 44 IU/L Final   Cholesterol, Total 12/25/2023 134  100  - 199 mg/dL Final   Triglycerides 09/04/3233 219 (H)  0 - 149 mg/dL Final   HDL 57/32/2025 43  >39 mg/dL Final   VLDL Cholesterol Cal 12/25/2023 35  5 - 40 mg/dL Final   LDL Chol Calc (NIH) 12/25/2023 56  0 - 99 mg/dL Final   TSH 42/70/6237 1.120  0.450 - 4.500 uIU/mL Final   Uric Acid 12/25/2023 5.7  3.8 - 8.4 mg/dL Final              Therapeutic target for gout patients: <6.0   Prostate Specific Ag, Serum 12/25/2023 1.0  0.0 - 4.0 ng/mL Final   Comment: Roche ECLIA methodology. According to the American Urological Association, Serum PSA should decrease and remain at undetectable levels after radical prostatectomy. The AUA defines biochemical recurrence as an initial PSA value 0.2 ng/mL or greater followed by a subsequent confirmatory PSA value 0.2 ng/mL or greater. Values obtained with different assay methods or kits cannot be used interchangeably. Results cannot be interpreted as absolute evidence of the presence or absence of malignant disease.     ECG: Date: 09/24/2023  Time ECG obtained: 1507 PM Rate: 63 bpm Rhythm: Sinus rhythm with first-degree AV block with PACs Axis (leads I and aVF): normal Intervals: PR 246 ms. QRS 102 ms. QTc 427 ms. ST segment and T wave changes: No evidence of acute T wave abnormalities or significant ST segment elevation or depression.  Evidence of a possible, age undetermined, prior infarct:  No Comparison: Similar to previous tracing obtained on 12/26/2021   IMAGING / PROCEDURES: CT ABDOMEN PELVIS W CONTRAST performed on 11/25/2023 Medium-sized right inguinal hernia containing fat the correlates with the prior ultrasound. Incipient bowel herniation without incarceration or obstruction. 3.4 x 3.3  cm infrarenal abdominal aortic aneurysms with atheromatous changes of the infrarenal abdominal aorta and aortic bifurcation. Recommend surveillance ultrasound in 3 years. 2 mm calcification in the left kidney midpole calices. 10 x 10 mm cortical cyst  right kidney mid upper pole. Bosniak 1, No follow-up imaging is recommended.  US  RT LOWER EXTREM LTD SOFT TISSUE NON VASCULAR performed on 11/07/2023 Right inguinal hernia noted with a Valsalva maneuver in the right groin. Containing fat only.  Correlation with CT abdomen and pelvis recommended as clinically needed.  DG LUMBAR SPINE COMPLETE performed on 10/30/2021 No recent fracture is seen.  There is minimal retrolisthesis at L3-L4 level.  Degenerative changes are noted with bony spurs and facet hypertrophy.  Vascular calcifications are seen in aorta and its major branches.  TRANSTHORACIC ECHOCARDIOGRAM performed on 03/20/2018 Normal left ventricular systolic function with an EF of 60-65% No regional wall motion abnormalities Normal right ventricular size and function Left atrium mildly dilated Mild aortic valve stenosis; mean pressure gradient = 10 mmHg; AVA (VTI) = 2.03 cm Aortic root mildly dilated at 3.8 cm Ascending aorta mildly dilated 3.6 cm  IMPRESSION AND PLAN: Mark Quinn has been referred for pre-anesthesia review and clearance prior to him undergoing the planned anesthetic and procedural courses. Available labs, pertinent testing, and imaging results were personally reviewed by me in preparation for upcoming operative/procedural course. [facility] medical record has been updated following extensive record review and patient interview with PAT staff.   This patient has been appropriately cleared by cardiology with an overall ACCEPTABLE risk of patient experiencing significant perioperative cardiovascular complications. Based on clinical review performed today (01/24/24), barring any significant acute changes in the patient's overall condition, it is anticipated that he will be able to proceed with the planned surgical intervention. Any acute changes in clinical condition may necessitate his procedure being postponed and/or cancelled. Patient will meet with anesthesia team  (MD and/or CRNA) on the day of his procedure for preoperative evaluation/assessment. Questions regarding anesthetic course will be fielded at that time.   Pre-surgical instructions were reviewed with the patient during his PAT appointment, and questions were fielded to satisfaction by PAT clinical staff. He has been instructed on which medications that he will need to hold prior to surgery, as well as the ones that have been deemed safe/appropriate to take on the day of his procedure. As part of the general education provided by PAT, patient made aware both verbally and in writing, that he would need to abstain from the use of any illegal substances during his perioperative course. He was advised that failure to follow the provided instructions could necessitate case cancellation or result in serious perioperative complications up to and including death. Patient encouraged to contact PAT and/or his surgeon's office to discuss any questions or concerns that may arise prior to surgery; verbalized understanding.   Renate Caroline, MSN, APRN, FNP-C, CEN [facility]  Perioperative Services Nurse Practitioner Phone: [phone] Fax: [phone] 01/24/24 10:21 AM  NOTE: This note has been prepared using Dragon dictation software. Despite my best ability to proofread, there is always the potential that unintentional transcriptional errors may still occur from this process.

## 2024-01-27 ENCOUNTER — Encounter
Admission: RE | Disposition: A | Discharge status: Home / Self Care | Payer: Self-pay | Source: Ambulatory Visit | Attending: General Surgery

## 2024-01-27 ENCOUNTER — Other Ambulatory Visit: Payer: Self-pay

## 2024-01-27 ENCOUNTER — Ambulatory Visit
Admission: RE | Admit: 2024-01-27 | Discharge: 2024-01-27 | Disposition: A | Discharge status: Home / Self Care | Source: Ambulatory Visit | Attending: General Surgery | Admitting: General Surgery

## 2024-01-27 ENCOUNTER — Ambulatory Visit: Payer: Self-pay | Admitting: Urgent Care

## 2024-01-27 ENCOUNTER — Encounter: Payer: Self-pay | Admitting: General Surgery

## 2024-01-27 DIAGNOSIS — Z7902 Long term (current) use of antithrombotics/antiplatelets: Secondary | ICD-10-CM | POA: Insufficient documentation

## 2024-01-27 DIAGNOSIS — I714 Abdominal aortic aneurysm, without rupture, unspecified: Secondary | ICD-10-CM | POA: Insufficient documentation

## 2024-01-27 DIAGNOSIS — Z955 Presence of coronary angioplasty implant and graft: Secondary | ICD-10-CM | POA: Insufficient documentation

## 2024-01-27 DIAGNOSIS — K4090 Unilateral inguinal hernia, without obstruction or gangrene, not specified as recurrent: Secondary | ICD-10-CM | POA: Diagnosis not present

## 2024-01-27 DIAGNOSIS — Z87891 Personal history of nicotine dependence: Secondary | ICD-10-CM | POA: Insufficient documentation

## 2024-01-27 DIAGNOSIS — K219 Gastro-esophageal reflux disease without esophagitis: Secondary | ICD-10-CM | POA: Insufficient documentation

## 2024-01-27 DIAGNOSIS — I10 Essential (primary) hypertension: Secondary | ICD-10-CM | POA: Diagnosis not present

## 2024-01-27 DIAGNOSIS — K4030 Unilateral inguinal hernia, with obstruction, without gangrene, not specified as recurrent: Secondary | ICD-10-CM | POA: Diagnosis not present

## 2024-01-27 DIAGNOSIS — I2510 Atherosclerotic heart disease of native coronary artery without angina pectoris: Secondary | ICD-10-CM | POA: Insufficient documentation

## 2024-01-27 HISTORY — DX: Infrarenal abdominal aortic aneurysm, without rupture: I71.43

## 2024-01-27 HISTORY — DX: Cyst of kidney, acquired: N28.1

## 2024-01-27 HISTORY — DX: Thoracic aortic ectasia: I77.810

## 2024-01-27 HISTORY — DX: Other intervertebral disc degeneration, lumbar region without mention of lumbar back pain or lower extremity pain: M51.369

## 2024-01-27 HISTORY — DX: Angina pectoris, unspecified: I20.9

## 2024-01-27 HISTORY — PX: HERNIORRHAPHY, INGUINAL, ROBOT-ASSISTED, LAPAROSCOPIC: SHX7585

## 2024-01-27 HISTORY — DX: Long term (current) use of antithrombotics/antiplatelets: Z79.02

## 2024-01-27 HISTORY — DX: Hyperlipidemia, unspecified: E78.5

## 2024-01-27 HISTORY — DX: Calculus of kidney: N20.0

## 2024-01-27 HISTORY — DX: Nonrheumatic aortic (valve) stenosis: I35.0

## 2024-01-27 HISTORY — PX: INSERTION OF MESH: SHX5868

## 2024-01-27 SURGERY — HERNIORRHAPHY, INGUINAL, ROBOT-ASSISTED, LAPAROSCOPIC
Anesthesia: General | Site: Inguinal | Laterality: Right

## 2024-01-27 MED ORDER — BUPIVACAINE LIPOSOME 1.3 % IJ SUSP
INTRAMUSCULAR | Status: AC
  Filled 2024-01-27: qty 10

## 2024-01-27 MED ORDER — ONDANSETRON HCL 4 MG/2ML IJ SOLN: 4.0000 mg | Freq: Once | INTRAMUSCULAR | Status: DC | PRN

## 2024-01-27 MED ORDER — CLOPIDOGREL BISULFATE 75 MG PO TABS: 75.0000 mg | ORAL_TABLET | Freq: Every day | ORAL | Status: AC

## 2024-01-27 MED ORDER — FENTANYL CITRATE (PF) 100 MCG/2ML IJ SOLN
INTRAMUSCULAR | Status: DC | PRN
  Administered 2024-01-27: 25 ug via INTRAVENOUS
  Administered 2024-01-27: 50 ug via INTRAVENOUS
  Administered 2024-01-27: 25 ug via INTRAVENOUS

## 2024-01-27 MED ORDER — ACETAMINOPHEN 10 MG/ML IV SOLN: 1000.0000 mg | Freq: Once | INTRAVENOUS | Status: DC | PRN

## 2024-01-27 MED ORDER — FENTANYL CITRATE (PF) 100 MCG/2ML IJ SOLN
INTRAMUSCULAR | Status: AC
  Filled 2024-01-27: qty 2

## 2024-01-27 MED ORDER — CEFAZOLIN SODIUM-DEXTROSE 2-4 GM/100ML-% IV SOLN
2.0000 g | INTRAVENOUS | Status: AC
  Administered 2024-01-27: 2 g via INTRAVENOUS

## 2024-01-27 MED ORDER — BUPIVACAINE-EPINEPHRINE 0.5% -1:200000 IJ SOLN
INTRAMUSCULAR | Status: DC | PRN
  Administered 2024-01-27: 20 mL via INTRAMUSCULAR

## 2024-01-27 MED ORDER — LACTATED RINGERS IV SOLN: INTRAVENOUS | Status: DC

## 2024-01-27 MED ORDER — DEXMEDETOMIDINE HCL IN NACL 80 MCG/20ML IV SOLN
INTRAVENOUS | Status: DC | PRN
  Administered 2024-01-27: 4 ug via INTRAVENOUS

## 2024-01-27 MED ORDER — LIDOCAINE HCL (CARDIAC) PF 100 MG/5ML IV SOSY
PREFILLED_SYRINGE | INTRAVENOUS | Status: DC | PRN
Start: 2024-01-27 — End: 2024-01-27
  Administered 2024-01-27: 60 mg via INTRAVENOUS

## 2024-01-27 MED ORDER — ONDANSETRON HCL 4 MG/2ML IJ SOLN
INTRAMUSCULAR | Status: AC
Start: 2024-01-27 — End: ?
  Filled 2024-01-27: qty 2

## 2024-01-27 MED ORDER — PROPOFOL 10 MG/ML IV BOLUS
INTRAVENOUS | Status: DC | PRN
Start: 2024-01-27 — End: 2024-01-27
  Administered 2024-01-27: 90 mg via INTRAVENOUS

## 2024-01-27 MED ORDER — DEXMEDETOMIDINE HCL IN NACL 80 MCG/20ML IV SOLN
INTRAVENOUS | Status: AC
  Filled 2024-01-27: qty 20

## 2024-01-27 MED ORDER — ACETAMINOPHEN 10 MG/ML IV SOLN
INTRAVENOUS | Status: AC
  Filled 2024-01-27: qty 100

## 2024-01-27 MED ORDER — FENTANYL CITRATE (PF) 100 MCG/2ML IJ SOLN
25.0000 ug | INTRAMUSCULAR | Status: DC | PRN
  Administered 2024-01-27: 25 ug via INTRAVENOUS

## 2024-01-27 MED ORDER — BUPIVACAINE-EPINEPHRINE (PF) 0.5% -1:200000 IJ SOLN
INTRAMUSCULAR | Status: AC
  Filled 2024-01-27: qty 10

## 2024-01-27 MED ORDER — MIDAZOLAM HCL 2 MG/2ML IJ SOLN
INTRAMUSCULAR | Status: AC
  Filled 2024-01-27: qty 2

## 2024-01-27 MED ORDER — DEXAMETHASONE SODIUM PHOSPHATE 10 MG/ML IJ SOLN
INTRAMUSCULAR | Status: DC | PRN
  Administered 2024-01-27: 10 mg via INTRAVENOUS

## 2024-01-27 MED ORDER — ROCURONIUM BROMIDE 10 MG/ML (PF) SYRINGE
PREFILLED_SYRINGE | INTRAVENOUS | Status: AC
Start: 2024-01-27 — End: ?
  Filled 2024-01-27: qty 10

## 2024-01-27 MED ORDER — ACETAMINOPHEN 10 MG/ML IV SOLN
INTRAVENOUS | Status: DC | PRN
  Administered 2024-01-27: 1000 mg via INTRAVENOUS

## 2024-01-27 MED ORDER — OXYCODONE HCL 5 MG PO TABS: 5.0000 mg | ORAL_TABLET | Freq: Four times a day (QID) | ORAL | 0 refills | Status: DC | PRN

## 2024-01-27 MED ORDER — ONDANSETRON HCL 4 MG/2ML IJ SOLN
INTRAMUSCULAR | Status: DC | PRN
  Administered 2024-01-27: 4 mg via INTRAVENOUS

## 2024-01-27 MED ORDER — DEXAMETHASONE SODIUM PHOSPHATE 10 MG/ML IJ SOLN
INTRAMUSCULAR | Status: AC
  Filled 2024-01-27: qty 1

## 2024-01-27 MED ORDER — SUGAMMADEX SODIUM 200 MG/2ML IV SOLN
INTRAVENOUS | Status: DC | PRN
  Administered 2024-01-27: 200 mg via INTRAVENOUS

## 2024-01-27 MED ORDER — ORAL CARE MOUTH RINSE: 15.0000 mL | Freq: Once | OROMUCOSAL | Status: AC

## 2024-01-27 MED ORDER — ROCURONIUM BROMIDE 100 MG/10ML IV SOLN
INTRAVENOUS | Status: DC | PRN
  Administered 2024-01-27: 50 mg via INTRAVENOUS

## 2024-01-27 MED ORDER — OXYCODONE HCL 5 MG/5ML PO SOLN: 5.0000 mg | Freq: Once | ORAL | Status: DC | PRN

## 2024-01-27 MED ORDER — CEFAZOLIN SODIUM-DEXTROSE 2-4 GM/100ML-% IV SOLN
INTRAVENOUS | Status: AC
Start: 2024-01-27 — End: ?
  Filled 2024-01-27: qty 100

## 2024-01-27 MED ORDER — CHLORHEXIDINE GLUCONATE CLOTH 2 % EX PADS: 6.0000 | MEDICATED_PAD | Freq: Once | CUTANEOUS | Status: DC

## 2024-01-27 MED ORDER — CHLORHEXIDINE GLUCONATE 0.12 % MT SOLN
15.0000 mL | Freq: Once | OROMUCOSAL | Status: AC
  Administered 2024-01-27: 15 mL via OROMUCOSAL

## 2024-01-27 MED ORDER — CHLORHEXIDINE GLUCONATE 0.12 % MT SOLN
OROMUCOSAL | Status: AC
  Filled 2024-01-27: qty 15

## 2024-01-27 MED ORDER — PROPOFOL 10 MG/ML IV BOLUS
INTRAVENOUS | Status: AC
  Filled 2024-01-27: qty 20

## 2024-01-27 MED ORDER — OXYCODONE HCL 5 MG PO TABS: 5.0000 mg | ORAL_TABLET | Freq: Once | ORAL | Status: DC | PRN

## 2024-01-27 MED ORDER — MIDAZOLAM HCL 2 MG/2ML IJ SOLN
INTRAMUSCULAR | Status: DC | PRN
  Administered 2024-01-27: 2 mg via INTRAVENOUS

## 2024-01-27 SURGICAL SUPPLY — 38 items
BAG PRESSURE INF REUSE 1000 (BAG) IMPLANT
COVER TIP SHEARS 8 DVNC (MISCELLANEOUS) ×2 IMPLANT
COVER WAND RF STERILE (DRAPES) ×2 IMPLANT
DERMABOND ADVANCED .7 DNX12 (GAUZE/BANDAGES/DRESSINGS) ×2 IMPLANT
DRAPE ARM DVNC X/XI (DISPOSABLE) ×6 IMPLANT
DRAPE COLUMN DVNC XI (DISPOSABLE) ×2 IMPLANT
DRIVER NDL LRG 8 DVNC XI (INSTRUMENTS) ×2 IMPLANT
DRIVER NDLE LRG 8 DVNC XI (INSTRUMENTS) ×2 IMPLANT
ELECTRODE REM PT RTRN 9FT ADLT (ELECTROSURGICAL) ×2 IMPLANT
FORCEPS BPLR FENES DVNC XI (FORCEP) ×2 IMPLANT
FORCEPS BPLR R/ABLATION 8 DVNC (INSTRUMENTS) ×2 IMPLANT
GLOVE BIOGEL PI IND STRL 7.5 (GLOVE) ×4 IMPLANT
GLOVE SURG SYN 7.0 (GLOVE) ×6 IMPLANT
GLOVE SURG SYN 7.0 PF PI (GLOVE) ×4 IMPLANT
GOWN STRL REUS W/ TWL LRG LVL3 (GOWN DISPOSABLE) ×6 IMPLANT
IRRIGATOR SUCT 8 DISP DVNC XI (IRRIGATION / IRRIGATOR) IMPLANT
IV NS 1000ML BAXH (IV SOLUTION) IMPLANT
KIT PINK PAD W/HEAD ARE REST (MISCELLANEOUS) ×2 IMPLANT
KIT PINK PAD W/HEAD ARM REST (MISCELLANEOUS) ×2 IMPLANT
LABEL OR SOLS (LABEL) IMPLANT
MANIFOLD NEPTUNE II (INSTRUMENTS) ×2 IMPLANT
MESH 3DMAX 4X6 RT LRG (Mesh General) IMPLANT
NDL HYPO 22X1.5 SAFETY MO (MISCELLANEOUS) ×2 IMPLANT
NDL INSUFFLATION 14GA 120MM (NEEDLE) ×2 IMPLANT
NEEDLE HYPO 22X1.5 SAFETY MO (MISCELLANEOUS) ×2 IMPLANT
NEEDLE INSUFFLATION 14GA 120MM (NEEDLE) ×2 IMPLANT
OBTURATOR OPTICALSTD 8 DVNC (TROCAR) ×2 IMPLANT
PACK LAP CHOLECYSTECTOMY (MISCELLANEOUS) ×2 IMPLANT
SCISSORS MNPLR CVD DVNC XI (INSTRUMENTS) ×2 IMPLANT
SEAL UNIV 5-12 XI (MISCELLANEOUS) ×6 IMPLANT
SET TUBE SMOKE EVAC HIGH FLOW (TUBING) ×2 IMPLANT
SOLUTION ELECTROSURG ANTI STCK (MISCELLANEOUS) ×2 IMPLANT
SUT STRATA 2-0 23CM CT-2 (SUTURE) ×2 IMPLANT
SUT VIC AB 2-0 SH 27XBRD (SUTURE) ×2 IMPLANT
SUTURE MNCRL 4-0 27XMF (SUTURE) ×2 IMPLANT
TAPE TRANSPORE STRL 2 31045 (GAUZE/BANDAGES/DRESSINGS) IMPLANT
TRAP FLUID SMOKE EVACUATOR (MISCELLANEOUS) ×2 IMPLANT
WATER STERILE IRR 500ML POUR (IV SOLUTION) ×2 IMPLANT

## 2024-01-27 NOTE — H&P (Signed)
 Patient has not taken plavix  since 5/28, proceed with RIGHt inguinal hernia repair robotic with mesh. No other changes to H and P below  CC: Right Inguinal Hernia History of Present Illness Mark Quinn is a 78 y.o. male with right inguinal hernia.  The patient returns today for preoperative appointment.  He has undergone cardiac risk stratification and he is at acceptable risk for surgery.  He will need to hold his Plavix  5 days prior to the procedure.  He continues to have pain in his right groin that radiates down to the inside of his thigh.  He also continues to have a bulge.  He has not had any obstructive symptoms nor has he had any overlying skin changes or irreducibility of his hernia..       Past Medical History:  Diagnosis Date   Anemia     Arthritis     CAD (coronary artery disease)     Cellulitis      of back   Clotting disorder (HCC)     GERD (gastroesophageal reflux disease)     Gout     Heart murmur     Hypertension     Melanoma (HCC) 06/27/2013    Stage 1 A Excision   Obesity                   Past Surgical History:  Procedure Laterality Date   CARDIAC CATHETERIZATION   2006    [facility]   CORONARY STENT PLACEMENT   2006     CYPHER drug eluting Stent RX 3.50 X 46mm/CYPHER drug eluting Stent RX 3.50 X 28mm          Allergies       Allergies  Allergen Reactions   Other        Seasonal Allergies              Current Outpatient Medications  Medication Sig Dispense Refill   allopurinol  (ZYLOPRIM ) 100 MG tablet Take 1 tablet (100 mg total) by mouth daily. 90 tablet 4   clopidogrel  (PLAVIX ) 75 MG tablet Take 1 tablet (75 mg total) by mouth daily. 90 tablet 3   ezetimibe  (ZETIA ) 10 MG tablet Take 1 tablet (10 mg total) by mouth daily. 90 tablet 3   lisinopril  (ZESTRIL ) 20 MG tablet Take 1 tablet (20 mg total) by mouth daily. 90 tablet 3   meloxicam  (MOBIC ) 15 MG tablet Take 1 tablet every day by oral route.       metoprolol  succinate (TOPROL -XL) 50 MG 24 hr  tablet Take with or immediately following a meal. 90 tablet 3   Multiple Vitamin (MULTIVITAMIN) tablet Take 1 tablet by mouth daily.       rosuvastatin  (CRESTOR ) 40 MG tablet Take 1 tablet (40 mg total) by mouth daily. 90 tablet 3      No current facility-administered medications for this visit.        Family History      Family History  Problem Relation Age of Onset   Heart disease Mother     Hyperlipidemia Mother     Diabetes Son     Kidney disease Son              Social History Social History  Social History         Tobacco Use   Smoking status: Former      Current packs/day: 0.00      Average packs/day: 1 pack/day for 8.0 years (8.0 ttl pk-yrs)  Types: Cigarettes      Start date: 04/13/1967      Quit date: 04/13/1975      Years since quitting: 48.8      Passive exposure: Past   Smokeless tobacco: Never  Vaping Use   Vaping status: Never Used  Substance Use Topics   Alcohol use: Yes      Comment: on occasion   Drug use: No            ROS Full ROS of systems performed and is otherwise negative there than what is stated in the HPI   Physical Exam Blood pressure 112/65, pulse (!) 54, temperature 98.2 F (36.8 C), height 5\' 10"  (1.778 m), weight 179 lb (81.2 kg), SpO2 97%.   No acute distress, alert and oriented x 3, normal work of breathing on room air, regular rate and rhythm, clear to auscultation bilaterally, abdomen is soft, nontender nondistended.  Right groin with obvious bulge on standing and Valsalva that is easily reducible.  This is on the right side but there is no evidence of hernia on the left side.   Data Reviewed I reviewed his ultrasound and CT that both show a right inguinal hernia that contains fat.   I have personally reviewed the patient's imaging and medical records.     Assessment/Plan Assessment 78 year old male with past medical history significant for coronary artery disease who presents in consultation for right inguinal  hernia.  This is easily reducible.  The patient is going on vacation in May and I recommended that he have this repaired after this.  He will need to hold his Plavix  for 5 days prior to the procedure.  I again discussed the risk, benefits alternatives of the procedure including risk of infection, bleeding damage to the vas deferens, chronic pain and recurrence.  He understands his risk and wishes to proceed.       Barrett Lick

## 2024-01-27 NOTE — Anesthesia Preprocedure Evaluation (Addendum)
 Anesthesia Evaluation  Patient identified by MRN, date of birth, ID band Patient awake    Reviewed: Allergy & Precautions, NPO status , Patient's Chart, lab work & pertinent test results  History of Anesthesia Complications Negative for: history of anesthetic complications  Airway Mallampati: IV   Neck ROM: Full    Dental  (+) Upper Dentures, Partial Lower   Pulmonary former smoker (quit 1976)   Pulmonary exam normal breath sounds clear to auscultation       Cardiovascular hypertension, + CAD (s/p stents on Plavix ) and + Peripheral Vascular Disease (AAA)  Normal cardiovascular exam Rhythm:Regular Rate:Normal  ECG 09/24/23:  Sinus rhythm with 1st degree A-V block with Premature atrial complexes with Abberant conduction When compared with ECG of 12/26/2021 no significant change   Neuro/Psych negative neurological ROS     GI/Hepatic ,GERD  ,,  Endo/Other  negative endocrine ROS    Renal/GU Renal disease (nephrolithiasis)     Musculoskeletal Gout    Abdominal   Peds  Hematology  (+) Blood dyscrasia, anemia   Anesthesia Other Findings Cardiology note 12/17/23:  Chart reviewed as part of pre-operative protocol coverage. Given past medical history and time since last visit, based on ACC/AHA guidelines, Mark Quinn would be at acceptable risk for the planned procedure without further cardiovascular testing.  Per Dr. Gollan Patient was seen in our office January 2025, at that time appeared stable with no anginal symptoms He is acceptable risk for hernia surgery Please hold Plavix  5 days prior to surgery When not on Plavix  he should take aspirin 81 daily Please have patient resume Plavix  following surgery when cleared by surgeon   Cardiology note 09/24/23:  CAD S/p DES x 2 to proximal RCA and DES to ramus April 2006 performed at [facility].  Patient denies chest pain, pressure or tightness. 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  -Continue Plavix , Toprol , rosuvastatin , Zetia .   Cardiac murmur  Patient reports he has been told since he was a teenager that he had a cardiac murmur. Echo July 2019 showed Very mild AS and mild MR. He denies lower extremity edema, shortness of breath, DOE with routine activities. 1/6 systolic murmur on exam.    Hypertension BP today 110/80. No headaches or dizziness.  -Continue lisinopril , Toprol .   Hyperlipidemia LDL July 2024 43, at goal. -Continue rosuvastatin  and Zetia .   Disposition: Return in 1 year or sooner as needed.    Reproductive/Obstetrics                             Anesthesia Physical Anesthesia Plan  ASA: 3  Anesthesia Plan: General   Post-op Pain Management:    Induction: Intravenous  PONV Risk Score and Plan: 2 and Ondansetron, Dexamethasone and Treatment may vary due to age or medical condition  Airway Management Planned: Oral ETT  Additional Equipment:   Intra-op Plan:   Post-operative Plan: Extubation in OR  Informed Consent: I have reviewed the patients History and Physical, chart, labs and discussed the procedure including the risks, benefits and alternatives for the proposed anesthesia with the patient or authorized representative who has indicated his/her understanding and acceptance.     Dental advisory given  Plan Discussed with: CRNA  Anesthesia Plan Comments: (Patient consented for risks of anesthesia including but not limited to:  - adverse reactions to medications - damage to eyes, teeth, lips or other oral mucosa - nerve damage due to  positioning  - sore throat or hoarseness - damage to heart, brain, nerves, lungs, other parts of body or loss of life  Informed patient about role of CRNA in peri- and intra-operative care.  Patient voiced understanding.)        Anesthesia Quick Evaluation

## 2024-01-27 NOTE — Transfer of Care (Signed)
 Immediate Anesthesia Transfer of Care Note  Patient: Mark Quinn  Procedure(s) Performed: HERNIORRHAPHY, INGUINAL, ROBOT-ASSISTED, LAPAROSCOPIC (Right: Inguinal) INSERTION OF MESH  Patient Location: PACU  Anesthesia Type:General  Level of Consciousness: awake, drowsy, and patient cooperative  Airway & Oxygen Therapy: Patient Spontanous Breathing and Patient connected to face mask oxygen  Post-op Assessment: Report given to RN and Post -op Vital signs reviewed and stable  Post vital signs: Reviewed and stable  Last Vitals:  Vitals Value Taken Time  BP 174/92 01/27/24 0926  Temp 36.1 C 01/27/24 0926  Pulse 54 01/27/24 0928  Resp 17 01/27/24 0928  SpO2 100 % 01/27/24 0928  Vitals shown include unfiled device data.  Last Pain:  Vitals:   01/27/24 0625  TempSrc: Oral  PainSc: 2          Complications: No notable events documented.

## 2024-01-27 NOTE — Anesthesia Procedure Notes (Signed)
 Procedure Name: Intubation Date/Time: 01/27/2024 7:39 AM  Performed by: Orin Birk, CRNAPre-anesthesia Checklist: Patient identified, Emergency Drugs available, Suction available and Patient being monitored Patient Re-evaluated:Patient Re-evaluated prior to induction Oxygen Delivery Method: Circle system utilized Preoxygenation: Pre-oxygenation with 100% oxygen Induction Type: IV induction Ventilation: Mask ventilation without difficulty Laryngoscope Size: McGrath and 4 Grade View: Grade I Tube type: Oral Tube size: 7.0 mm Number of attempts: 1 Airway Equipment and Method: Stylet and Video-laryngoscopy Placement Confirmation: positive ETCO2, breath sounds checked- equal and bilateral and ETT inserted through vocal cords under direct vision Secured at: 21 cm Tube secured with: Tape Dental Injury: Teeth and Oropharynx as per pre-operative assessment

## 2024-01-27 NOTE — Op Note (Signed)
 Procedure Date:  01/27/24   Pre-operative Diagnosis:  Right Inguinal Hernia  Post-operative Diagnosis: Right Direct Inguinal Hernia  Procedure: 1.  Robotic assisted Right Inguinal Hernia Repair 2.  Creation of Right Posterior Rectus-Transversalis Fascia Advancment Flap for Coverage of Pelvic Wound (200 cm)  Surgeon:  Severa Daniels, M.D.   Anesthesia:  General endotracheal  Estimated Blood Loss:  10 ml  Specimens:  None  Complications:  None  Indications for Procedure:  This is a 78 y.o. male who presents with a right inguinal hernia.  The options of surgery versus observation were reviewed with the patient and/or family. The risks of bleeding, abscess or infection, recurrence of symptoms, potential for an open procedure, injury to surrounding structures, and chronic pain were all discussed with the patient and he was willing to proceed.  We have planned this transabdominal procedure with the creation of right peritoneal flap based on the posterior rectus sheath and transversalis fascia in order to fully cover the mesh, creating a natural tisssue barrier for the bowel and peritoneal cavity.  Description of Procedure: The patient was correctly identified in the preoperative area and brought into the operating room.  The patient was placed supine with VTE prophylaxis in place.  Appropriate time-outs were performed.  Anesthesia was induced and the patient was intubated.   Appropriate antibiotics were infused.  The abdomen was prepped and draped in a sterile fashion. An incision was made at [place] and a veress needle was placed into the abdomen using standard drop technique. Pneumoperitoneum was then established. Using an optiview trocar a supra-umbilical robotic port was placed. No injury was noted at veress needle insertion site. Two 8-mm robotic ports were placed in the right and left lateral positions under direct visualization.    The [employer] platform was docked onto the patient,  the camera was inserted and targeted, and the instruments were placed under direct visualization.  Both inguinal regions were inspected for hernias and it was confirmed that the patient had a right inguinal hernia.  Using electocautery, the peritoneal and posterior rectus tissue flap was created.  The peritoneum on the right side was scored from the median umbilical ligament laterally towards the ASIS.  The flap was mobilized using robotic scissors and the bipolar instruments, creating a plane along the posterior rectus sheath and transversalis fascia down to the pubic tubercle medially. It was then further mobilized laterally across the inguinal canal and femoral vessels and onto the psoas muscle. The inferior epigastric vessels were identified and preserved. This created a posterior rectus and peritoneal flap measuring roughly 17 cm x 12 cm.  The hernia sac and contents were reduced preserving all structures. The patient had a right direct hernia defect. A large 3D Maxpolypropelene mesh was then inserted into the abdomen along with a 2-0 v-lock suture and 2-0vicryl suture. The mesh was placed into the pre-peritoneal space and there was good coverage of all hernia spaces. It was secured to the pubic tubercle with a 2-0 vicrly Then, the peritoneal flap was advanced over the mesh and carried over to close the defect. A running 2-0 V lock suture was used to approximate the edge of the flap onto the peritoneum.   All needles were removed under direct visualization.  The 8- mm ports were removed under direct visualization and the Hasson trocar was removed.   Local anesthetic was infused in all incisions  and the incisions were closed with 4-0 Monocryl.  The wounds were cleaned and sealed with DermaBond.  The patient was emerged from anesthesia and extubated and brought to the recovery room for further management.  The patient tolerated the procedure well and all counts were correct at the end of the  case.   Severa Daniels, M.D.

## 2024-01-28 ENCOUNTER — Ambulatory Visit: Admitting: General Surgery

## 2024-01-28 ENCOUNTER — Encounter: Payer: Self-pay | Admitting: General Surgery

## 2024-01-28 NOTE — Anesthesia Postprocedure Evaluation (Signed)
 Anesthesia Post Note  Patient: Mark Quinn  Procedure(s) Performed: HERNIORRHAPHY, INGUINAL, ROBOT-ASSISTED, LAPAROSCOPIC (Right: Inguinal) INSERTION OF MESH  Patient location during evaluation: PACU Anesthesia Type: General Level of consciousness: awake and alert Pain management: pain level controlled Vital Signs Assessment: post-procedure vital signs reviewed and stable Respiratory status: spontaneous breathing, nonlabored ventilation and respiratory function stable Cardiovascular status: blood pressure returned to baseline and stable Postop Assessment: no apparent nausea or vomiting Anesthetic complications: no   No notable events documented.   Last Vitals:  Vitals:   01/27/24 1023 01/27/24 1057  BP: (!) 147/89 (!) 141/84  Pulse: (!) 50 (!) 53  Resp: 16 15  Temp: (!) 36.1 C   SpO2: 96% 98%    Last Pain:  Vitals:   01/27/24 1057  TempSrc:   PainSc: 0-No pain                 Baltazar Bonier

## 2024-02-11 ENCOUNTER — Encounter: Payer: Self-pay | Admitting: General Surgery

## 2024-02-11 ENCOUNTER — Ambulatory Visit

## 2024-02-11 ENCOUNTER — Ambulatory Visit (INDEPENDENT_AMBULATORY_CARE_PROVIDER_SITE_OTHER): Admitting: General Surgery

## 2024-02-11 VITALS — BP 142/71 | HR 74 | Temp 98.4°F | Ht 70.0 in | Wt 176.2 lb

## 2024-02-11 DIAGNOSIS — K4090 Unilateral inguinal hernia, without obstruction or gangrene, not specified as recurrent: Secondary | ICD-10-CM

## 2024-02-11 MED ORDER — OXYCODONE HCL 5 MG PO TABS: 5.0000 mg | ORAL_TABLET | Freq: Three times a day (TID) | ORAL | 0 refills | Status: DC | PRN

## 2024-02-11 NOTE — Patient Instructions (Signed)

## 2024-02-20 ENCOUNTER — Encounter: Payer: Self-pay | Admitting: General Surgery

## 2024-02-20 ENCOUNTER — Ambulatory Visit (INDEPENDENT_AMBULATORY_CARE_PROVIDER_SITE_OTHER): Admitting: General Surgery

## 2024-02-20 VITALS — BP 123/75 | HR 61 | Temp 98.5°F | Ht 70.0 in | Wt 176.0 lb

## 2024-02-20 DIAGNOSIS — K4090 Unilateral inguinal hernia, without obstruction or gangrene, not specified as recurrent: Secondary | ICD-10-CM

## 2024-02-20 NOTE — Patient Instructions (Addendum)
 We will see you in 2 weeks to re examine and discuss surgery. Please call the office if you have any questions or concerns

## 2024-02-23 NOTE — Progress Notes (Signed)
 The patient returns again today status post robotic assisted right inguinal hernia repair.  He continues to have a bulge and the right groin that I am concerned is a recurrence.  He is having no overlying skin changes and he says the pain is intermittent.  He denies any obstipation symptoms.  I discussed with him that he will likely require surgery for this unfortunately.  I also discussed with him that I would have to do some an open repair.  I like to wait to see if this can soften up a bit.  On exam it is quite indurated and swollen I am unable to reduce the bulge.  We will plan to see him again and a few weeks and hopefully the area has gotten less indurated so that we can perform a better repair.

## 2024-02-23 NOTE — Progress Notes (Signed)
 Patient is status post inguinal hernia repair.  He reports that there is been a swelling a ndbulge at his right groin.   He does report that there is some pain to this.  On exam his robotic port sites are healing well.  He does have a very hard bulge in his right groin.  I am concerned that there has been a recurrence.  This is not reducible  I instructed him to place ice over this and see if we can get some of the swelling to go down and see if he can get what is likely recurrence to be reducible.  I would like to see him again in 1 weeks.  I discussed concerning symptoms with him including increased pain, overlying skin change and obstipation.SABRA

## 2024-03-05 ENCOUNTER — Ambulatory Visit (INDEPENDENT_AMBULATORY_CARE_PROVIDER_SITE_OTHER): Admitting: General Surgery

## 2024-03-05 ENCOUNTER — Encounter: Payer: Self-pay | Admitting: General Surgery

## 2024-03-05 VITALS — BP 113/75 | HR 66 | Ht 70.0 in | Wt 174.0 lb

## 2024-03-05 DIAGNOSIS — Z09 Encounter for follow-up examination after completed treatment for conditions other than malignant neoplasm: Secondary | ICD-10-CM

## 2024-03-05 DIAGNOSIS — K4090 Unilateral inguinal hernia, without obstruction or gangrene, not specified as recurrent: Secondary | ICD-10-CM

## 2024-03-05 NOTE — Patient Instructions (Addendum)
 We would like for you to have a CT scan done prior to any surgery.   We have scheduled you for a CT Scan of your Abdomen and Pelvis with contrast. This has been scheduled at [facility] on 03/12/24 . Please arrive there by 8:45 am and enter in through the [facility] entrance. If you need to reschedule your Scan, you may do so by calling [phone]. Please let us  know if you reschedule your scan as we have to get authorization from your insurance for this.    We will call after we get you CT results.  You have chose to have your hernia repaired. This will be done by Dr. Marinda at [facility].  You may hold your Plavix  for 5 days prior to surgery but will need to remain on the Aspirin 81 mg daily.   Please see your (blue) Pre-care information that you have been given today. Our surgery scheduler will call you to verify surgery date and to go over information.   You will need to arrange to be out of work for approximately 1-2 weeks and then you may return with a lifting restriction for 4 more weeks. If you have FMLA or Disability paperwork that needs to be filled out, please have your company fax your paperwork to [phone] or you may drop this by either office. This paperwork will be filled out within 3 days after your surgery has been completed.  You may have a bruise in your groin and also swelling and brusing in your testicle area. You may use ice 4-5 times daily for 15-20 minutes each time. Make sure that you place a barrier between you and the ice pack. To decrease the swelling, you may roll up a bath towel and place it vertically in between your thighs with your testicles resting on the towel. You will want to keep this area elevated as much as possible for several days following surgery.    Inguinal Hernia, Adult Muscles help keep everything in the body in its proper place. But if a weak spot in the muscles develops, something can poke through. That is called a hernia. When  this happens in the lower part of the belly (abdomen), it is called an inguinal hernia. (It takes its name from a part of the body in this region called the inguinal canal.) A weak spot in the wall of muscles lets some fat or part of the small intestine bulge through. An inguinal hernia can develop at any age. Men get them more often than women. CAUSES  In adults, an inguinal hernia develops over time. It can be triggered by: Suddenly straining the muscles of the lower abdomen. Lifting heavy objects. Straining to have a bowel movement. Difficult bowel movements (constipation) can lead to this. Constant coughing. This may be caused by smoking or lung disease. Being overweight. Being pregnant. Working at a job that requires long periods of standing or heavy lifting. Having had an inguinal hernia before. One type can be an emergency situation. It is called a strangulated inguinal hernia. It develops if part of the small intestine slips through the weak spot and cannot get back into the abdomen. The blood supply can be cut off. If that happens, part of the intestine may die. This situation requires emergency surgery. SYMPTOMS  Often, a small inguinal hernia has no symptoms. It is found when a healthcare provider does a physical exam. Larger hernias usually have symptoms.  In adults, symptoms may include:  A lump in the groin. This is easier to see when the person is standing. It might disappear when lying down. In men, a lump in the scrotum. Pain or burning in the groin. This occurs especially when lifting, straining or coughing. A dull ache or feeling of pressure in the groin. Signs of a strangulated hernia can include: A bulge in the groin that becomes very painful and tender to the touch. A bulge that turns red or purple. Fever, nausea and vomiting. Inability to have a bowel movement or to pass gas. DIAGNOSIS  To decide if you have an inguinal hernia, a healthcare provider will probably do a  physical examination. This will include asking questions about any symptoms you have noticed. The healthcare provider might feel the groin area and ask you to cough. If an inguinal hernia is felt, the healthcare provider may try to slide it back into the abdomen. Usually no other tests are needed. TREATMENT  Treatments can vary. The size of the hernia makes a difference. Options include: Watchful waiting. This is often suggested if the hernia is small and you have had no symptoms. No medical procedure will be done unless symptoms develop. You will need to watch closely for symptoms. If any occur, contact your healthcare provider right away. Surgery. This is used if the hernia is larger or you have symptoms. Open surgery. This is usually an outpatient procedure (you will not stay overnight in a hospital). An cut (incision) is made through the skin in the groin. The hernia is put back inside the abdomen. The weak area in the muscles is then repaired by herniorrhaphy or hernioplasty. Herniorrhaphy: in this type of surgery, the weak muscles are sewn back together. Hernioplasty: a patch or mesh is used to close the weak area in the abdominal wall. Laparoscopy. In this procedure, a surgeon makes small incisions. A thin tube with a tiny video camera (called a laparoscope) is put into the abdomen. The surgeon repairs the hernia with mesh by looking with the video camera and using two long instruments. HOME CARE INSTRUCTIONS  After surgery to repair an inguinal hernia: You will need to take pain medicine prescribed by your healthcare provider. Follow all directions carefully. You will need to take care of the wound from the incision. Your activity will be restricted for awhile. This will probably include no heavy lifting for several weeks. You also should not do anything too active for a few weeks. When you can return to work will depend on the type of job that you have. During watchful waiting periods,  you should: Maintain a healthy weight. Eat a diet high in fiber (fruits, vegetables and whole grains). Drink plenty of fluids to avoid constipation. This means drinking enough water and other liquids to keep your urine clear or pale yellow. Do not lift heavy objects. Do not stand for long periods of time. Quit smoking. This should keep you from developing a frequent cough. SEEK MEDICAL CARE IF:  A bulge develops in your groin area. You feel pain, a burning sensation or pressure in the groin. This might be worse if you are lifting or straining. You develop a fever of more than 100.5 F (38.1 C). SEEK IMMEDIATE MEDICAL CARE IF:  Pain in the groin increases suddenly. A bulge in the groin gets bigger suddenly and does not go down. For men, there is sudden pain in the scrotum. Or, the size of the scrotum increases. A bulge in the groin area becomes red or  purple and is painful to touch. You have nausea or vomiting that does not go away. You feel your heart beating much faster than normal. You cannot have a bowel movement or pass gas. You develop a fever of more than 102.0 F (38.9 C).   This information is not intended to replace advice given to you by your health care provider. Make sure you discuss any questions you have with your health care provider.   Document Released: 12/30/2008 Document Revised: 11/05/2011 Document Reviewed: 02/14/2015 Elsevier Interactive Patient Education [employer]

## 2024-03-05 NOTE — Progress Notes (Signed)
 Outpatient Surgical Follow Up  03/05/2024  Mark Quinn is an 78 y.o. male.   Chief Complaint  Patient presents with   Routine Post Op    HPI: Patient returns today status post right inguinal hernia repair.  Unfortunately he likely has a recurrence.  He does says that he still has pain in his right groin but denies any nausea or vomiting.  He is tolerating a regular diet and having bowel movements.  Past Medical History:  Diagnosis Date   Acquired dilation of ascending aorta and aortic root (HCC)    Anemia    Angina pectoris (HCC)    Aortic stenosis    Arthritis    CAD (coronary artery disease) 12/07/2004   a.) LHC/PCI 12/07/2004: 95% pRCA (3.5 x 23 mm and 3.5 x 13 mm Cypher DES), 95% RI (4.0 x 12 mm Driver BMS)   DDD (degenerative disc disease), lumbar    GERD (gastroesophageal reflux disease)    Gout    Heart murmur    HLD (hyperlipidemia)    Hypertension    Infrarenal abdominal aortic aneurysm (AAA) without rupture (HCC)    Inguinal hernia, right    Long term current use of clopidogrel     Melanoma (HCC) 06/27/2013   Stage 1 A Excision   Nephrolithiasis    Obesity    Renal cyst, right     Past Surgical History:  Procedure Laterality Date   CORONARY STENT PLACEMENT Left 12/07/2004   Procedure: CORONARY STENT PLACEMENT; Location: [facility]; Surgeon: Jerilynn Cleveland, MD   HERNIORRHAPHY, INGUINAL, ROBOT-ASSISTED, LAPAROSCOPIC Right 01/27/2024   Procedure: HERNIORRHAPHY, INGUINAL, ROBOT-ASSISTED, LAPAROSCOPIC;  Surgeon: Marinda Jayson KIDD, MD;  Location: [facility];  Service: General;  Laterality: Right;   INSERTION OF MESH  01/27/2024   Procedure: INSERTION OF MESH;  Surgeon: Marinda Jayson KIDD, MD;  Location: [facility];  Service: General;;   LEFT HEART CATH AND CORONARY ANGIOGRAPHY Left 12/07/2004   Procedure: LEFT HEART CATH AND CORONARY ANGIOGRAPHY; Location: [facility]; Surgeon: Denyse Bathe, MD   LEFT HEART CATH AND CORONARY ANGIOGRAPHY Left 09/10/2008   Procedure: LEFT HEART CATH  AND CORONARY ANGIOGRAPHY; Location: [facility]; Surgeon: Denyse Bathe, MD    Family History  Problem Relation Age of Onset   Heart disease Mother    Hyperlipidemia Mother    Diabetes Son    Kidney disease Son     Social History:  reports that he quit smoking about 48 years ago. His smoking use included cigarettes. He started smoking about 56 years ago. He has a 8 pack-year smoking history. He has been exposed to tobacco smoke. He has never used smokeless tobacco. He reports current alcohol use. He reports that he does not use drugs.  Allergies:  Allergies  Allergen Reactions   Other     Seasonal Allergies    Medications reviewed.    ROS Full ROS performed and is otherwise negative other than what is stated in HPI   BP 113/75   Pulse 66   Ht 5' 10 (1.778 m)   Wt 174 lb (78.9 kg)   SpO2 98%   BMI 24.97 kg/m   Physical Exam  Right groin with obvious bulge that is nonreducible.  It is painful to touch.  There is no overlying skin changes or erythema.  It does not increase in size with Valsalva.   No results found for this or any previous visit (from the past 48 hours). No results found.  Assessment/Plan:  1. Right inguinal hernia (Primary) Discussed with him  that this is likely a recurrence and that the treatment would be to do an open inguinal hernia repair.  We will first get a CT scan to evaluate the anatomy and then we will discuss and bring him back for an open right inguinal hernia repair - CT ABDOMEN PELVIS W CONTRAST; Future    Jayson Endow, M.D. [facility]

## 2024-03-10 ENCOUNTER — Telehealth: Payer: Self-pay | Admitting: General Surgery

## 2024-03-10 NOTE — Telephone Encounter (Signed)
 Someone was to check with the insuracne company [employer] to see if they would cover the Dr going back in to do more surgery on Hernia. He is having his CT on Thursday but wants to know if we have heard anything from the insurance if it will cover the surgery again. Pt number [phone].

## 2024-03-12 ENCOUNTER — Ambulatory Visit
Admission: RE | Admit: 2024-03-12 | Discharge: 2024-03-12 | Disposition: A | Discharge status: Home / Self Care | Source: Ambulatory Visit | Attending: General Surgery | Admitting: General Surgery

## 2024-03-12 DIAGNOSIS — N281 Cyst of kidney, acquired: Secondary | ICD-10-CM | POA: Diagnosis not present

## 2024-03-12 DIAGNOSIS — K4090 Unilateral inguinal hernia, without obstruction or gangrene, not specified as recurrent: Secondary | ICD-10-CM | POA: Diagnosis not present

## 2024-03-12 DIAGNOSIS — K5730 Diverticulosis of large intestine without perforation or abscess without bleeding: Secondary | ICD-10-CM | POA: Diagnosis not present

## 2024-03-12 DIAGNOSIS — N200 Calculus of kidney: Secondary | ICD-10-CM | POA: Diagnosis not present

## 2024-03-12 DIAGNOSIS — I7143 Infrarenal abdominal aortic aneurysm, without rupture: Secondary | ICD-10-CM | POA: Diagnosis not present

## 2024-03-12 LAB — POCT I-STAT CREATININE: Creatinine, Ser: 1.2 mg/dL (ref 0.61–1.24)

## 2024-03-12 MED ORDER — IOHEXOL 300 MG/ML  SOLN
100.0000 mL | Freq: Once | INTRAMUSCULAR | Status: AC | PRN
  Administered 2024-03-12: 100 mL via INTRAVENOUS

## 2024-03-16 ENCOUNTER — Telehealth: Payer: Self-pay | Admitting: Nurse Practitioner

## 2024-03-16 NOTE — Telephone Encounter (Signed)
 Copied from CRM [id number]. Topic: Medicare AWV >> Mar 16, 2024  1:32 PM Nathanel DEL wrote: Reason for CRM: LVM 03/16/2024 reminder call for AWV appt on Tuesday 03/18/2023    Nathanel Paschal; Care Guide Ambulatory Clinical Support [facility] Direct Dial: [phone]

## 2024-03-17 ENCOUNTER — Ambulatory Visit

## 2024-03-17 ENCOUNTER — Telehealth: Payer: Self-pay

## 2024-03-17 NOTE — Telephone Encounter (Signed)
 Front office or Cassius, can you guys check on this for the patient? Can he do his wellness and follow up on the same day with his insurance?

## 2024-03-17 NOTE — Telephone Encounter (Signed)
 Copied from CRM [id number]. Topic: Appointments - Appointment Scheduling >> Mar 17, 2024  8:14 AM Emylou G wrote: Patient called.. wants to do the wellness appt 10/30 9am.. I see its noted on that day - but want to make sure he can do this the same day as his f/u?

## 2024-03-18 NOTE — Telephone Encounter (Signed)
 Moved appt to 10/28

## 2024-03-26 ENCOUNTER — Ambulatory Visit: Admitting: General Surgery

## 2024-03-31 ENCOUNTER — Ambulatory Visit: Admitting: General Surgery

## 2024-04-09 ENCOUNTER — Ambulatory Visit: Admitting: General Surgery

## 2024-04-16 ENCOUNTER — Ambulatory Visit: Admitting: General Surgery

## 2024-04-16 ENCOUNTER — Encounter: Payer: Self-pay | Admitting: General Surgery

## 2024-04-16 VITALS — BP 105/66 | HR 61 | Ht 70.0 in | Wt 171.0 lb

## 2024-04-16 DIAGNOSIS — Z09 Encounter for follow-up examination after completed treatment for conditions other than malignant neoplasm: Secondary | ICD-10-CM

## 2024-04-16 DIAGNOSIS — K4090 Unilateral inguinal hernia, without obstruction or gangrene, not specified as recurrent: Secondary | ICD-10-CM

## 2024-04-16 MED ORDER — CEPHALEXIN 750 MG PO CAPS: 750.0000 mg | ORAL_CAPSULE | Freq: Three times a day (TID) | ORAL | 0 refills | Status: AC

## 2024-04-16 NOTE — Patient Instructions (Signed)
 We will see you back in 4 weeks.  Please call the office if you have any questions or concerns

## 2024-04-16 NOTE — Progress Notes (Signed)
 Outpatient Surgical Follow Up  04/16/2024  Mark Quinn is an 78 y.o. male.   Chief Complaint  Patient presents with   Follow-up    Discuss CT Scan     HPI: Mark Quinn returns today status post right inguinal hernia repair done in June.  He did have a persistent bulge in the area.  I did get a CT scan that showed a fluid collection in the subcutaneous tissue.  He returns today for follow-up.  He reports that he is feeling well and denies any pain in the right groin.  He says that the bulge seems to have gotten a little bit smaller since our last visit.  He denies any overlying skin changes or drainage in the area.  He says that it does not get bigger when he coughs or bears down.  Past Medical History:  Diagnosis Date   Acquired dilation of ascending aorta and aortic root (HCC)    Anemia    Angina pectoris (HCC)    Aortic stenosis    Arthritis    CAD (coronary artery disease) 12/07/2004   a.) LHC/PCI 12/07/2004: 95% pRCA (3.5 x 23 mm and 3.5 x 13 mm Cypher DES), 95% RI (4.0 x 12 mm Driver BMS)   DDD (degenerative disc disease), lumbar    GERD (gastroesophageal reflux disease)    Gout    Heart murmur    HLD (hyperlipidemia)    Hypertension    Infrarenal abdominal aortic aneurysm (AAA) without rupture (HCC)    Inguinal hernia, right    Long term current use of clopidogrel     Melanoma (HCC) 06/27/2013   Stage 1 A Excision   Nephrolithiasis    Obesity    Renal cyst, right     Past Surgical History:  Procedure Laterality Date   CORONARY STENT PLACEMENT Left 12/07/2004   Procedure: CORONARY STENT PLACEMENT; Location: [facility]; Surgeon: Jerilynn Cleveland, MD   HERNIORRHAPHY, INGUINAL, ROBOT-ASSISTED, LAPAROSCOPIC Right 01/27/2024   Procedure: HERNIORRHAPHY, INGUINAL, ROBOT-ASSISTED, LAPAROSCOPIC;  Surgeon: Marinda Jayson KIDD, MD;  Location: [facility];  Service: General;  Laterality: Right;   INSERTION OF MESH  01/27/2024   Procedure: INSERTION OF MESH;  Surgeon: Marinda Jayson KIDD, MD;   Location: [facility];  Service: General;;   LEFT HEART CATH AND CORONARY ANGIOGRAPHY Left 12/07/2004   Procedure: LEFT HEART CATH AND CORONARY ANGIOGRAPHY; Location: [facility]; Surgeon: Denyse Bathe, MD   LEFT HEART CATH AND CORONARY ANGIOGRAPHY Left 09/10/2008   Procedure: LEFT HEART CATH AND CORONARY ANGIOGRAPHY; Location: [facility]; Surgeon: Denyse Bathe, MD    Family History  Problem Relation Age of Onset   Heart disease Mother    Hyperlipidemia Mother    Diabetes Son    Kidney disease Son     Social History:  reports that he quit smoking about 49 years ago. His smoking use included cigarettes. He started smoking about 57 years ago. He has a 8 pack-year smoking history. He has been exposed to tobacco smoke. He has never used smokeless tobacco. He reports current alcohol use. He reports that he does not use drugs.  Allergies:  Allergies  Allergen Reactions   Other     Seasonal Allergies    Medications reviewed.    ROS Full ROS performed and is otherwise negative other than what is stated in HPI   BP 105/66   Pulse 61   Ht 5' 10 (1.778 m)   Wt 171 lb (77.6 kg)   SpO2 98%   BMI 24.54 kg/m  Physical Exam Right groin examined and there continues to be a indurated, not mobile mass in the right groin region.  CT scan reviewed and it looks like there is a fluid collection in the subcutaneous tissue in the right groin.    No results found for this or any previous visit (from the past 48 hours). No results found.  Assessment/Plan:  Patient with right groin bulge after hernia surgery.  CT scan does not show that there is recurrence of the hernia.  I ultrasounded the area today and it looks like there is a well-defined fluid collection.  The right groin was then prepped and draped in the usual sterile fashion and under ultrasound guidance a 20-gauge needle was inserted into the fluid collection.  I aspirated 13 cc of serosanguineous fluid consistent with a seroma.  There was  clinically a reduction of the fluid collection and it looks like to collapse on ultrasound.  Will plan to see him again in several weeks to check on how he is doing.  Given that we did violate the skin in the area of mesh I would like to put him on 48 hours of antibiotics prophylactically.  Jayson Endow, M.D. [facility]

## 2024-05-19 ENCOUNTER — Ambulatory Visit: Admitting: General Surgery

## 2024-06-21 NOTE — Patient Instructions (Signed)
 Be Involved in Caring For Your Health:  Taking Medications When medications are taken as directed, they can greatly improve your health. But if they are not taken as prescribed, they may not work. In some cases, not taking them correctly can be harmful. To help ensure your treatment remains effective and safe, understand your medications and how to take them. Bring your medications to each visit for review by your provider.  Your lab results, notes, and after visit summary will be available on My Chart. We strongly encourage you to use this feature. If lab results are abnormal the clinic will contact you with the appropriate steps. If the clinic does not contact you assume the results are satisfactory. You can always view your results on My Chart. If you have questions regarding your health or results, please contact the clinic during office hours. You can also ask questions on My Chart.  We at [facility] are grateful that you chose [place] to provide your care. We strive to provide evidence-based and compassionate care and are always looking for feedback. If you get a survey from the clinic please complete this so we can hear your opinions.  Heart-Healthy Eating Plan Many factors influence your heart health, including eating and exercise habits. Heart health is also called coronary health. Coronary risk increases with abnormal blood fat (lipid) levels. A heart-healthy eating plan includes limiting unhealthy fats, increasing healthy fats, limiting salt (sodium) intake, and making other diet and lifestyle changes. What is my plan? Your health care provider may recommend that: You limit your fat intake to _________% or less of your total calories each day. You limit your saturated fat intake to _________% or less of your total calories each day. You limit the amount of cholesterol in your diet to less than _________ mg per day. You limit the amount of sodium in your diet to less than _________  mg per day. What are tips for following this plan? Cooking Cook foods using methods other than frying. Baking, boiling, grilling, and broiling are all good options. Other ways to reduce fat include: Removing the skin from poultry. Removing all visible fats from meats. Steaming vegetables in water or broth. Meal planning  At meals, imagine dividing your plate into fourths: Fill one-half of your plate with vegetables and green salads. Fill one-fourth of your plate with whole grains. Fill one-fourth of your plate with lean protein foods. Eat 2-4 cups of vegetables per day. One cup of vegetables equals 1 cup (91 g) broccoli or cauliflower florets, 2 medium carrots, 1 large bell pepper, 1 large sweet potato, 1 large tomato, 1 medium white potato, 2 cups (150 g) raw leafy greens. Eat 1-2 cups of fruit per day. One cup of fruit equals 1 small apple, 1 large banana, 1 cup (237 g) mixed fruit, 1 large orange,  cup (82 g) dried fruit, 1 cup (240 mL) 100% fruit juice. Eat more foods that contain soluble fiber. Examples include apples, broccoli, carrots, beans, peas, and barley. Aim to get 25-30 g of fiber per day. Increase your consumption of legumes, nuts, and seeds to 4-5 servings per week. One serving of dried beans or legumes equals  cup (90 g) cooked, 1 serving of nuts is  oz (12 almonds, 24 pistachios, or 7 walnut halves), and 1 serving of seeds equals  oz (8 g). Fats Choose healthy fats more often. Choose monounsaturated and polyunsaturated fats, such as olive and canola oils, avocado oil, flaxseeds, walnuts, almonds, and seeds. Eat  more omega-3 fats. Choose salmon, mackerel, sardines, tuna, flaxseed oil, and ground flaxseeds. Aim to eat fish at least 2 times each week. Check food labels carefully to identify foods with trans fats or high amounts of saturated fat. Limit saturated fats. These are found in animal products, such as meats, butter, and cream. Plant sources of saturated fats  include palm oil, palm kernel oil, and coconut oil. Avoid foods with partially hydrogenated oils in them. These contain trans fats. Examples are stick margarine, some tub margarines, cookies, crackers, and other baked goods. Avoid fried foods. General information Eat more home-cooked food and less restaurant, buffet, and fast food. Limit or avoid alcohol. Limit foods that are high in added sugar and simple starches such as foods made using white refined flour (white breads, pastries, sweets). Lose weight if you are overweight. Losing just 5-10% of your body weight can help your overall health and prevent diseases such as diabetes and heart disease. Monitor your sodium intake, especially if you have high blood pressure. Talk with your health care provider about your sodium intake. Try to incorporate more vegetarian meals weekly. What foods should I eat? Fruits All fresh, canned (in natural juice), or frozen fruits. Vegetables Fresh or frozen vegetables (raw, steamed, roasted, or grilled). Green salads. Grains Most grains. Choose whole wheat and whole grains most of the time. Rice and pasta, including brown rice and pastas made with whole wheat. Meats and other proteins Lean, well-trimmed beef, veal, pork, and lamb. Chicken and [place] without skin. All fish and shellfish. Wild duck, rabbit, pheasant, and venison. Egg whites or low-cholesterol egg substitutes. Dried beans, peas, lentils, and tofu. Seeds and most nuts. Dairy Low-fat or nonfat cheeses, including ricotta and mozzarella. Skim or 1% milk (liquid, powdered, or evaporated). Buttermilk made with low-fat milk. Nonfat or low-fat yogurt. Fats and oils Non-hydrogenated (trans-free) margarines. Vegetable oils, including soybean, sesame, sunflower, olive, avocado, peanut, safflower, corn, canola, and cottonseed. Salad dressings or mayonnaise made with a vegetable oil. Beverages Water (mineral or sparkling). Coffee and tea. Unsweetened ice  tea. Diet beverages. Sweets and desserts Sherbet, gelatin, and fruit ice. Small amounts of dark chocolate. Limit all sweets and desserts. Seasonings and condiments All seasonings and condiments. The items listed above may not be a complete list of foods and beverages you can eat. Contact a dietitian for more options. What foods should I avoid? Fruits Canned fruit in heavy syrup. Fruit in cream or butter sauce. Fried fruit. Limit coconut. Vegetables Vegetables cooked in cheese, cream, or butter sauce. Fried vegetables. Grains Breads made with saturated or trans fats, oils, or whole milk. Croissants. Sweet rolls. Donuts. High-fat crackers, such as cheese crackers and chips. Meats and other proteins Fatty meats, such as hot dogs, ribs, sausage, bacon, rib-eye roast or steak. High-fat deli meats, such as salami and bologna. Caviar. Domestic duck and goose. Organ meats, such as liver. Dairy Cream, sour cream, cream cheese, and creamed cottage cheese. Whole-milk cheeses. Whole or 2% milk (liquid, evaporated, or condensed). Whole buttermilk. Cream sauce or high-fat cheese sauce. Whole-milk yogurt. Fats and oils Meat fat, or shortening. Cocoa butter, hydrogenated oils, palm oil, coconut oil, palm kernel oil. Solid fats and shortenings, including bacon fat, salt pork, lard, and butter. Nondairy cream substitutes. Salad dressings with cheese or sour cream. Beverages Regular sodas and any drinks with added sugar. Sweets and desserts Frosting. Pudding. Cookies. Cakes. Pies. Milk chocolate or white chocolate. Buttered syrups. Full-fat ice cream or ice cream drinks. The items listed above may  not be a complete list of foods and beverages to avoid. Contact a dietitian for more information. Summary Heart-healthy meal planning includes limiting unhealthy fats, increasing healthy fats, limiting salt (sodium) intake and making other diet and lifestyle changes. Lose weight if you are overweight. Losing just  5-10% of your body weight can help your overall health and prevent diseases such as diabetes and heart disease. Focus on eating a balance of foods, including fruits and vegetables, low-fat or nonfat dairy, lean protein, nuts and legumes, whole grains, and heart-healthy oils and fats. This information is not intended to replace advice given to you by your health care provider. Make sure you discuss any questions you have with your health care provider. Document Revised: 09/18/2021 Document Reviewed: 09/18/2021 [employer] Patient Education  2024 [employer].

## 2024-06-23 ENCOUNTER — Ambulatory Visit (INDEPENDENT_AMBULATORY_CARE_PROVIDER_SITE_OTHER): Admitting: Nurse Practitioner

## 2024-06-23 ENCOUNTER — Ambulatory Visit: Admitting: Emergency Medicine

## 2024-06-23 ENCOUNTER — Encounter: Payer: Self-pay | Admitting: Nurse Practitioner

## 2024-06-23 VITALS — BP 108/60 | Ht 70.0 in | Wt 174.6 lb

## 2024-06-23 VITALS — BP 115/73 | HR 62 | Temp 98.1°F | Resp 14 | Ht 70.0 in | Wt 174.0 lb

## 2024-06-23 DIAGNOSIS — Z23 Encounter for immunization: Secondary | ICD-10-CM | POA: Diagnosis not present

## 2024-06-23 DIAGNOSIS — I2510 Atherosclerotic heart disease of native coronary artery without angina pectoris: Secondary | ICD-10-CM

## 2024-06-23 DIAGNOSIS — Z0000 Encounter for general adult medical examination without abnormal findings: Secondary | ICD-10-CM

## 2024-06-23 DIAGNOSIS — I10 Essential (primary) hypertension: Secondary | ICD-10-CM

## 2024-06-23 DIAGNOSIS — I7143 Infrarenal abdominal aortic aneurysm, without rupture: Secondary | ICD-10-CM | POA: Diagnosis not present

## 2024-06-23 DIAGNOSIS — E782 Mixed hyperlipidemia: Secondary | ICD-10-CM

## 2024-06-23 MED ORDER — FINASTERIDE 5 MG PO TABS: 5.0000 mg | ORAL_TABLET | Freq: Every day | ORAL | 4 refills | Status: AC

## 2024-06-23 NOTE — Assessment & Plan Note (Signed)
Chronic, stable.  No recent CP.  Continue current medication regimen and collaboration with cardiology.  Continue Plavix, history of stents.

## 2024-06-23 NOTE — Patient Instructions (Signed)
 [name],  Thank you for taking the time for your Medicare Wellness Visit. I appreciate your continued commitment to your health goals. Please review the care plan we discussed, and feel free to reach out if I can assist you further.  Medicare recommends these wellness visits once per year to help you and your care team stay ahead of potential health issues. These visits are designed to focus on prevention, allowing your provider to concentrate on managing your acute and chronic conditions during your regular appointments.  Please note that Annual Wellness Visits do not include a physical exam. Some assessments may be limited, especially if the visit was conducted virtually. If needed, we may recommend a separate in-person follow-up with your provider.  Ongoing Care Seeing your primary care provider every 3 to 6 months helps us  monitor your health and provide consistent, personalized care.   Referrals If a referral was made during today's visit and you haven't received any updates within two weeks, please contact the referred provider directly to check on the status.  Recommended Screenings:  Keep up the good work!  Health Maintenance  Topic Date Due   COVID-19 Vaccine (5 - 2025-26 season) 07/07/2024*   Colon Cancer Screening  12/24/2024*   Medicare Annual Wellness Visit  06/23/2025   DTaP/Tdap/Td vaccine (3 - Td or Tdap) 04/04/2030   Pneumococcal Vaccine for age over 73  Completed   Flu Shot  Completed   Hepatitis C Screening  Completed   Zoster (Shingles) Vaccine  Completed   Meningitis B Vaccine  Aged Out  *Topic was postponed. The date shown is not the original due date.       06/23/2024    3:10 PM  Advanced Directives  Does Patient Have a Medical Advance Directive? No  Would patient like information on creating a medical advance directive? No - Patient declined   Advance Care Planning is important because it: Ensures you receive medical care that aligns with your values,  goals, and preferences. Provides guidance to your family and loved ones, reducing the emotional burden of decision-making during critical moments.  Vision: Annual vision screenings are recommended for early detection of glaucoma, cataracts, and diabetic retinopathy. These exams can also reveal signs of chronic conditions such as diabetes and high blood pressure.  Dental: Annual dental screenings help detect early signs of oral cancer, gum disease, and other conditions linked to overall health, including heart disease and diabetes.  Please see the attached documents for additional preventive care recommendations.   Fall Prevention in the Home, Adult Falls can cause injuries and affect people of all ages. There are many simple things that you can do to make your home safe and to help prevent falls. If you need it, ask for help making these changes. What actions can I take to prevent falls? General information Use good lighting in all rooms. Make sure to: Replace any light bulbs that burn out. Turn on lights if it is dark and use night-lights. Keep items that you use often in easy-to-reach places. Lower the shelves around your home if needed. Move furniture so that there are clear paths around it. Do not keep throw rugs or other things on the floor that can make you trip. If any of your floors are uneven, fix them. Add color or contrast paint or tape to clearly mark and help you see: Grab bars or handrails. First and last steps of staircases. Where the edge of each step is. If you use a ladder or  stepladder: Make sure that it is fully opened. Do not climb a closed ladder. Make sure the sides of the ladder are locked in place. Have someone hold the ladder while you use it. Know where your pets are as you move through your home. What can I do in the bathroom?     Keep the floor dry. Clean up any water that is on the floor right away. Remove soap buildup in the bathtub or shower. Buildup  makes bathtubs and showers slippery. Use non-skid mats or decals on the floor of the bathtub or shower. Attach bath mats securely with double-sided, non-slip rug tape. If you need to sit down while you are in the shower, use a non-slip stool. Install grab bars by the toilet and in the bathtub and shower. Do not use towel bars as grab bars. What can I do in the bedroom? Make sure that you have a light by your bed that is easy to reach. Do not use any sheets or blankets on your bed that hang to the floor. Have a firm bench or chair with side arms that you can use for support when you get dressed. What can I do in the kitchen? Clean up any spills right away. If you need to reach something above you, use a sturdy step stool that has a grab bar. Keep electrical cables out of the way. Do not use floor polish or wax that makes floors slippery. What can I do with my stairs? Do not leave anything on the stairs. Make sure that you have a light switch at the top and the bottom of the stairs. Have them installed if you do not have them. Make sure that there are handrails on both sides of the stairs. Fix handrails that are broken or loose. Make sure that handrails are as long as the staircases. Install non-slip stair treads on all stairs in your home if they do not have carpet. Avoid having throw rugs at the top or bottom of stairs, or secure the rugs with carpet tape to prevent them from moving. Choose a carpet design that does not hide the edge of steps on the stairs. Make sure that carpet is firmly attached to the stairs. Fix any carpet that is loose or worn. What can I do on the outside of my home? Use bright outdoor lighting. Repair the edges of walkways and driveways and fix any cracks. Clear paths of anything that can make you trip, such as tools or rocks. Add color or contrast paint or tape to clearly mark and help you see high doorway thresholds. Trim any bushes or trees on the main path into  your home. Check that handrails are securely fastened and in good repair. Both sides of all steps should have handrails. Install guardrails along the edges of any raised decks or porches. Have leaves, snow, and ice cleared regularly. Use sand, salt, or ice melt on walkways during winter months if you live where there is ice and snow. In the garage, clean up any spills right away, including grease or oil spills. What other actions can I take? Review your medicines with your health care provider. Some medicines can make you confused or feel dizzy. This can increase your chance of falling. Wear closed-toe shoes that fit well and support your feet. Wear shoes that have rubber soles and low heels. Use a cane, walker, scooter, or crutches that help you move around if needed. Talk with your provider about other ways that  you can decrease your risk of falls. This may include seeing a physical therapist to learn to do exercises to improve movement and strength. Where to find more information Centers for Disease Control and Prevention, [employer]: tonerpromos.no [employer] on Aging: baseringtones.pl National Institute on Aging: baseringtones.pl Contact a health care provider if: You are afraid of falling at home. You feel weak, drowsy, or dizzy at home. You fall at home. Get help right away if you: Lose consciousness or have trouble moving after a fall. Have a fall that causes a head injury. These symptoms may be an emergency. Get help right away. Call 911. Do not wait to see if the symptoms will go away. Do not drive yourself to the hospital. This information is not intended to replace advice given to you by your health care provider. Make sure you discuss any questions you have with your health care provider. Document Revised: 04/16/2022 Document Reviewed: 04/16/2022 Elsevier Patient Education  2024 [employer].

## 2024-06-23 NOTE — Assessment & Plan Note (Signed)
 Noted on CT scan 11/26/23.  Educated him on this in past and plan of care with this.  He is taking Crestor  and Plavix  for history of stents.  Continue these for prevention.  Will recheck via ultrasound in 3 years. BP at goal for AAA.

## 2024-06-23 NOTE — Assessment & Plan Note (Signed)
 Chronic, ongoing.  Continue current medication regimen and adjust as needed. Lipid panel today.

## 2024-06-23 NOTE — Progress Notes (Signed)
 BP 115/73 (BP Location: Left Arm, Patient Position: Sitting, Cuff Size: Normal)   Pulse 62   Temp 98.1 F (36.7 C) (Oral)   Resp 14   Ht 5' 10 (1.778 m)   Wt 174 lb (78.9 kg)   SpO2 97%   BMI 24.97 kg/m    Subjective:    Patient ID: Mark Quinn, male    DOB: 22-Mar-1946, 78 y.o.   MRN: [id number]  HPI: Mark Quinn is a 78 y.o. male  Chief Complaint  Patient presents with   HTN/HLD   HYPERTENSION / HYPERLIPIDEMIA Taking Lisinopril , Metoprolol , Rosuvastatin , Zetia , Plavix . History of stents placed 15 years ago. Saw Dr. Gollan on 09/24/23.  Known AAA seen 11/26/23, 3.4 x 3.3 cm infrarenal.   Satisfied with current treatment? yes Duration of hypertension: chronic BP monitoring frequency: once a week BP range: <130/80 on average BP medication side effects: no Duration of hyperlipidemia: chronic Cholesterol medication side effects: no Cholesterol supplements: none Medication compliance: good compliance Aspirin: Plavix  Recent stressors: no Recurrent headaches: no Visual changes: no Palpitations: no Dyspnea: no Chest pain: no Lower extremity edema: no Dizzy/lightheaded: no  Relevant past medical, surgical, family and social history reviewed and updated as indicated. Interim medical history since our last visit reviewed. Allergies and medications reviewed and updated.  Review of Systems  Constitutional:  Negative for activity change, diaphoresis, fatigue and fever.  Respiratory:  Negative for cough, chest tightness, shortness of breath and wheezing.   Cardiovascular:  Negative for chest pain, palpitations and leg swelling.  Gastrointestinal: Negative.   Neurological: Negative.   Psychiatric/Behavioral: Negative.      Per HPI unless specifically indicated above     Objective:    BP 115/73 (BP Location: Left Arm, Patient Position: Sitting, Cuff Size: Normal)   Pulse 62   Temp 98.1 F (36.7 C) (Oral)   Resp 14   Ht 5' 10 (1.778 m)   Wt 174 lb (78.9 kg)    SpO2 97%   BMI 24.97 kg/m   Wt Readings from Last 3 Encounters:  06/23/24 174 lb (78.9 kg)  06/23/24 174 lb 9.6 oz (79.2 kg)  04/16/24 171 lb (77.6 kg)    Physical Exam Vitals and nursing note reviewed.  Constitutional:      General: He is awake. He is not in acute distress.    Appearance: He is well-developed and well-groomed. He is not ill-appearing or toxic-appearing.  HENT:     Head: Normocephalic.     Right Ear: Hearing and external ear normal.     Left Ear: Hearing and external ear normal.  Eyes:     General: Lids are normal.     Extraocular Movements: Extraocular movements intact.     Conjunctiva/sclera: Conjunctivae normal.  Neck:     Thyroid : No thyromegaly.     Vascular: No carotid bruit.  Cardiovascular:     Rate and Rhythm: Normal rate and regular rhythm.     Heart sounds: Murmur heard.     Systolic murmur is present with a grade of 2/6.     No gallop.  Pulmonary:     Effort: No accessory muscle usage or respiratory distress.     Breath sounds: Normal breath sounds.  Abdominal:     General: Bowel sounds are normal. There is no distension.     Palpations: Abdomen is soft.     Tenderness: There is no abdominal tenderness.  Musculoskeletal:     Cervical back: Full passive range of motion without  pain.     Right lower leg: No edema.     Left lower leg: No edema.  Lymphadenopathy:     Cervical: No cervical adenopathy.  Skin:    General: Skin is warm.     Capillary Refill: Capillary refill takes less than 2 seconds.  Neurological:     Mental Status: He is alert and oriented to person, place, and time.     Deep Tendon Reflexes: Reflexes are normal and symmetric.     Reflex Scores:      Brachioradialis reflexes are 2+ on the right side and 2+ on the left side.      Patellar reflexes are 2+ on the right side and 2+ on the left side. Psychiatric:        Attention and Perception: Attention normal.        Mood and Affect: Mood normal.        Speech: Speech  normal.        Behavior: Behavior normal. Behavior is cooperative.        Thought Content: Thought content normal.    Results for orders placed or performed during the hospital encounter of 03/12/24  I-STAT creatinine   Collection Time: 03/12/24  9:03 AM  Result Value Ref Range   Creatinine, Ser 1.20 0.61 - 1.24 mg/dL      Assessment & Plan:   Problem List Items Addressed This Visit       Cardiovascular and Mediastinum   Essential hypertension, benign - Primary   Chronic, ongoing.  BP well below goal in office. Recommend he monitor BP at least a few mornings a week at home and document.  DASH diet at home.  Continue current medication regimen and adjust as needed.  Labs today: CMP.  Urine ALB 05 December 2023, continue Lisinopril  for kidney protection.  Return in 6 months.       CAD (coronary artery disease)   Chronic, stable.  No recent CP.  Continue current medication regimen and collaboration with cardiology.  Continue Plavix , history of stents.      Abdominal aortic aneurysm (AAA)   Noted on CT scan 11/26/23.  Educated him on this in past and plan of care with this.  He is taking Crestor  and Plavix  for history of stents.  Continue these for prevention.  Will recheck via ultrasound in 3 years. BP at goal for AAA.        Other   Hyperlipemia   Chronic, ongoing.  Continue current medication regimen and adjust as needed.  Lipid panel today.         Relevant Orders   Comprehensive metabolic panel with GFR   Lipid Panel w/o Chol/HDL Ratio     Follow up plan: Return in about 6 months (around 12/25/2024) for Annual Physical after 12/24/24.

## 2024-06-23 NOTE — Assessment & Plan Note (Signed)
 Chronic, ongoing.  BP well below goal in office. Recommend he monitor BP at least a few mornings a week at home and document.  DASH diet at home.  Continue current medication regimen and adjust as needed.  Labs today: CMP.  Urine ALB 05 December 2023, continue Lisinopril  for kidney protection.  Return in 6 months.

## 2024-06-23 NOTE — Progress Notes (Signed)
 Subjective:   Mark Quinn is a 78 y.o. who presents for a Medicare Wellness preventive visit.  As a reminder, Annual Wellness Visits don't include a physical exam, and some assessments may be limited, especially if this visit is performed virtually. We may recommend an in-person follow-up visit with your provider if needed.  Visit Complete: In person    Persons Participating in Visit: Patient.  AWV Questionnaire: Yes: Patient Medicare AWV questionnaire was completed by the patient on 06/16/24; I have confirmed that all information answered by patient is correct and no changes since this date.  Cardiac Risk Factors include: advanced age (>2men, >57 women);male gender;dyslipidemia;hypertension;Other (see comment), Risk factor comments: CAD     Objective:    Today's Vitals   06/23/24 1455  BP: 108/60  Weight: 174 lb 9.6 oz (79.2 kg)  Height: 5' 10 (1.778 m)   Body mass index is 25.05 kg/m.     06/23/2024    3:10 PM 01/27/2024    6:19 AM 01/22/2024    8:15 AM  Advanced Directives  Does Patient Have a Medical Advance Directive? No No No  Would patient like information on creating a medical advance directive? No - Patient declined No - Patient declined No - Patient declined    Current Medications (verified) Outpatient Encounter Medications as of 06/23/2024  Medication Sig   allopurinol  (ZYLOPRIM ) 100 MG tablet Take 1 tablet (100 mg total) by mouth daily.   clopidogrel  (PLAVIX ) 75 MG tablet Take 1 tablet (75 mg total) by mouth daily.   ezetimibe  (ZETIA ) 10 MG tablet Take 1 tablet (10 mg total) by mouth daily.   finasteride  (PROSCAR ) 5 MG tablet Take 5 mg by mouth daily.   lisinopril  (ZESTRIL ) 20 MG tablet Take 1 tablet (20 mg total) by mouth daily.   meloxicam  (MOBIC ) 15 MG tablet Take 1 tablet every day by oral route.   metoprolol  succinate (TOPROL -XL) 50 MG 24 hr tablet Take with or immediately following a meal.   Multiple Vitamin (MULTIVITAMIN) tablet Take 1 tablet by  mouth daily.   rosuvastatin  (CRESTOR ) 40 MG tablet Take 1 tablet (40 mg total) by mouth daily.   No facility-administered encounter medications on file as of 06/23/2024.    Allergies (verified) Other   History: Past Medical History:  Diagnosis Date   Acquired dilation of ascending aorta and aortic root    Anemia    Angina pectoris    Aortic stenosis    Arthritis    CAD (coronary artery disease) 12/07/2004   a.) LHC/PCI 12/07/2004: 95% pRCA (3.5 x 23 mm and 3.5 x 13 mm Cypher DES), 95% RI (4.0 x 12 mm Driver BMS)   DDD (degenerative disc disease), lumbar    GERD (gastroesophageal reflux disease)    Gout    Heart murmur    HLD (hyperlipidemia)    Hypertension    Infrarenal abdominal aortic aneurysm (AAA) without rupture    Inguinal hernia, right    Long term current use of clopidogrel     Melanoma (HCC) 06/27/2013   Stage 1 A Excision   Nephrolithiasis    Obesity    Renal cyst, right    Past Surgical History:  Procedure Laterality Date   CORONARY STENT PLACEMENT Left 12/07/2004   Procedure: CORONARY STENT PLACEMENT; Location: [facility]; Surgeon: Jerilynn Cleveland, MD   HERNIA REPAIR  01/2024   HERNIORRHAPHY, INGUINAL, ROBOT-ASSISTED, LAPAROSCOPIC Right 01/27/2024   Procedure: HERNIORRHAPHY, INGUINAL, ROBOT-ASSISTED, LAPAROSCOPIC;  Surgeon: Marinda Jayson KIDD, MD;  Location: [facility];  Service: General;  Laterality: Right;   INSERTION OF MESH  01/27/2024   Procedure: INSERTION OF MESH;  Surgeon: Marinda Jayson KIDD, MD;  Location: [facility];  Service: General;;   LEFT HEART CATH AND CORONARY ANGIOGRAPHY Left 12/07/2004   Procedure: LEFT HEART CATH AND CORONARY ANGIOGRAPHY; Location: [facility]; Surgeon: Denyse Bathe, MD   LEFT HEART CATH AND CORONARY ANGIOGRAPHY Left 09/10/2008   Procedure: LEFT HEART CATH AND CORONARY ANGIOGRAPHY; Location: [facility]; Surgeon: Denyse Bathe, MD   Family History  Problem Relation Age of Onset   Heart disease Mother    Hyperlipidemia Mother    Arthritis Mother     Diabetes Mother    Hearing loss Father    Diabetes Son    Kidney disease Son    Social History   Socioeconomic History   Marital status: Married    Spouse name: Devere   Number of children: 3   Years of education: Not on file   Highest education level: Some college, no degree  Occupational History   Not on file  Tobacco Use   Smoking status: Former    Current packs/day: 0.00    Average packs/day: 1 pack/day for 8.0 years (8.0 ttl pk-yrs)    Types: Cigarettes    Start date: 04/13/1967    Quit date: 04/13/1975    Years since quitting: 49.2    Passive exposure: Past   Smokeless tobacco: Never  Vaping Use   Vaping status: Never Used  Substance and Sexual Activity   Alcohol use: Yes    Comment: on occasion, 1-2 drinks every other week on the weekends   Drug use: No   Sexual activity: Yes    Birth control/protection: None  Other Topics Concern   Not on file  Social History Narrative   Not on file   Social Drivers of Health   Financial Resource Strain: Low Risk  (06/23/2024)   Overall Financial Resource Strain (CARDIA)    Difficulty of Paying Living Expenses: Not very hard  Recent Concern: Physicist, Medical Strain - High Risk (06/16/2024)   Overall Financial Resource Strain (CARDIA)    Difficulty of Paying Living Expenses: Very hard  Food Insecurity: No Food Insecurity (06/23/2024)   Hunger Vital Sign    Worried About Running Out of Food in the Last Year: Never true    Ran Out of Food in the Last Year: Never true  Recent Concern: Food Insecurity - Food Insecurity Present (06/16/2024)   Hunger Vital Sign    Worried About Running Out of Food in the Last Year: Often true    Ran Out of Food in the Last Year: Sometimes true  Transportation Needs: No Transportation Needs (06/23/2024)   PRAPARE - Administrator, Civil Service (Medical): No    Lack of Transportation (Non-Medical): No  Physical Activity: Insufficiently Active (06/23/2024)   Exercise Vital Sign     Days of Exercise per Week: 2 days    Minutes of Exercise per Session: 30 min  Stress: No Stress Concern Present (06/23/2024)   [employer] of Occupational Health - Occupational Stress Questionnaire    Feeling of Stress: Not at all  Social Connections: Moderately Integrated (06/23/2024)   Social Connection and Isolation Panel    Frequency of Communication with Friends and Family: Three times a week    Frequency of Social Gatherings with Friends and Family: Twice a week    Attends Religious Services: 1 to 4 times per year    Active Member of Clubs or  Organizations: No    Attends Banker Meetings: Never    Marital Status: Married  Recent Concern: Social Connections - Moderately Isolated (06/16/2024)   Social Connection and Isolation Panel    Frequency of Communication with Friends and Family: Twice a week    Frequency of Social Gatherings with Friends and Family: Once a week    Attends Religious Services: Patient declined    Database Administrator or Organizations: No    Attends Engineer, Structural: Not on file    Marital Status: Married    Tobacco Counseling Counseling given: Not Answered    Clinical Intake:  Pre-visit preparation completed: Yes  Pain : No/denies pain     BMI - recorded: 25.05 Nutritional Status: BMI 25 -29 Overweight Nutritional Risks: None Diabetes: No  Lab Results  Component Value Date   HGBA1C 5.5 05/31/2022   HGBA1C 5.4 11/28/2021     How often do you need to have someone help you when you read instructions, pamphlets, or other written materials from your doctor or pharmacy?: 1 - Never  Interpreter Needed?: No  Information entered by :: Vina Ned, CMA   Activities of Daily Living     06/23/2024    2:58 PM 06/16/2024    3:37 PM  In your present state of health, do you have any difficulty performing the following activities:  Hearing? 0 0  Vision? 0 0  Difficulty concentrating or making decisions? 0 0   Walking or climbing stairs? 0 0  Dressing or bathing? 0 0  Doing errands, shopping? 0 0  Preparing Food and eating ? N N  Using the Toilet? N N  In the past six months, have you accidently leaked urine? N N  Do you have problems with loss of bowel control? N N  Managing your Medications? N N  Managing your Finances? N N  Housekeeping or managing your Housekeeping? N Y    Patient Care Team: Cannady, Jolene T, NP as PCP - General (Nurse Practitioner) Gollan, Timothy J, MD as Consulting Physician (Cardiology) [facility]) Marinda Jayson KIDD, MD as Consulting Physician (General Surgery)  I have updated your Care Teams any recent Medical Services you may have received from other providers in the past year.     Assessment:   This is a routine wellness examination for Lionardo.  Hearing/Vision screen Hearing Screening - Comments:: Denies hearing loss  Vision Screening - Comments:: UTD @ [place]   Goals Addressed               This Visit's Progress     Maintain current health and activity level (pt-stated)         Depression Screen     06/23/2024    3:06 PM 12/25/2023    1:04 PM 11/05/2023    3:01 PM 11/05/2023    2:54 PM 06/25/2023    3:19 PM 12/03/2022   11:15 AM 05/31/2022    8:32 AM  PHQ 2/9 Scores  PHQ - 2 Score 0 0 0 4 0 0 0  PHQ- 9 Score 0 0 1 9 0 1 0    Fall Risk     06/23/2024    3:11 PM 06/16/2024    3:37 PM 12/25/2023    1:03 PM 06/25/2023    3:19 PM 12/03/2022   11:28 AM  Fall Risk   Falls in the past year? 0 0 0 0 0  Number falls in past yr: 0  0 0 0  Injury with Fall? 0 0 0 0 0  Risk for fall due to : No Fall Risks  No Fall Risks No Fall Risks No Fall Risks  Follow up Falls evaluation completed  Falls evaluation completed Falls evaluation completed Falls prevention discussed    MEDICARE RISK AT HOME:  Medicare Risk at Home Any stairs in or around the home?: Yes If so, are there any without handrails?: No Home free  of loose throw rugs in walkways, pet beds, electrical cords, etc?: Yes Adequate lighting in your home to reduce risk of falls?: Yes Life alert?: No Use of a cane, walker or w/c?: No Grab bars in the bathroom?: No Shower chair or bench in shower?: No Elevated toilet seat or a handicapped toilet?: No  TIMED UP AND GO:  Was the test performed?  Yes  Length of time to ambulate 10 feet: 5 sec Gait steady and fast without use of assistive device  Cognitive Function: 6CIT completed        06/23/2024    3:11 PM 12/03/2022   11:30 AM  6CIT Screen  What Year? 0 points 0 points  What month? 0 points 0 points  What time? 0 points 0 points  Count back from 20 0 points 0 points  Months in reverse 0 points 0 points  Repeat phrase 0 points 0 points  Total Score 0 points 0 points    Immunizations Immunization History  Administered Date(s) Administered   INFLUENZA, HIGH DOSE SEASONAL PF 05/27/2020, 06/23/2024   Influenza,inj,Quad PF,6+ Mos 05/14/2023   Influenza-Unspecified 05/27/2015, 05/27/2018, 06/01/2019, 05/16/2021, 05/08/2022   Moderna Covid-19 Fall Seasonal Vaccine 47yrs & older 07/16/2023   PFIZER Comirnaty(Gray Top)Covid-19 Tri-Sucrose Vaccine 10/09/2019, 10/30/2019, 08/17/2020   Pneumococcal Conjugate-13 09/21/2014   Pneumococcal Polysaccharide-23 01/11/2010, 03/31/2019   Td 04/04/2020   Tdap 10/26/2009   Zoster Recombinant(Shingrix ) 05/31/2022, 12/13/2022   Zoster, Live 08/06/2012    Screening Tests Health Maintenance  Topic Date Due   COVID-19 Vaccine (5 - 2025-26 season) 07/07/2024 (Originally 04/27/2024)   Colonoscopy  12/24/2024 (Originally 10/17/2023)   Medicare Annual Wellness (AWV)  06/23/2025   DTaP/Tdap/Td (3 - Td or Tdap) 04/04/2030   Pneumococcal Vaccine: 50+ Years  Completed   Influenza Vaccine  Completed   Hepatitis C Screening  Completed   Zoster Vaccines- Shingrix   Completed   Meningococcal B Vaccine  Aged Out    Health Maintenance Items  Addressed: Vaccines Given today: flu, See Nurse Notes at the end of this note  Additional Screening:  Vision Screening: Recommended annual ophthalmology exams for early detection of glaucoma and other disorders of the eye. Is the patient up to date with their annual eye exam?  Yes  Who is the provider or what is the name of the office in which the patient attends annual eye exams? [facility] [place]   Dental Screening: Recommended annual dental exams for proper oral hygiene  Community Resource Referral / Chronic Care Management: CRR required this visit?  No   CCM required this visit?  No   Plan:    I have personally reviewed and noted the following in the patient's chart:   Medical and social history Use of alcohol, tobacco or illicit drugs  Current medications and supplements including opioid prescriptions. Patient is not currently taking opioid prescriptions. Functional ability and status Nutritional status Physical activity Advanced directives List of other physicians Hospitalizations, surgeries, and ER visits in previous 12 months Vitals Screenings to include cognitive, depression, and falls Referrals  and appointments  In addition, I have reviewed and discussed with patient certain preventive protocols, quality metrics, and best practice recommendations. A written personalized care plan for preventive services as well as general preventive health recommendations were provided to patient.   Vina Ned, CMA   06/23/2024   After Visit Summary: (In Person-Printed) AVS printed and given to the patient  Notes:  Flu vaccine given today Declined Covid vaccine Screening colonoscopy no longer recommended due to age.

## 2024-06-24 ENCOUNTER — Ambulatory Visit: Payer: Self-pay | Admitting: Nurse Practitioner

## 2024-06-24 LAB — COMPREHENSIVE METABOLIC PANEL WITH GFR
ALT: 16 IU/L (ref 0–44)
AST: 19 IU/L (ref 0–40)
Albumin: 4 g/dL (ref 3.8–4.8)
Alkaline Phosphatase: 53 IU/L (ref 47–123)
BUN/Creatinine Ratio: 10 (ref 10–24)
BUN: 11 mg/dL (ref 8–27)
Bilirubin Total: 0.4 mg/dL (ref 0.0–1.2)
CO2: 26 mmol/L (ref 20–29)
Calcium: 9.7 mg/dL (ref 8.6–10.2)
Chloride: 103 mmol/L (ref 96–106)
Creatinine, Ser: 1.09 mg/dL (ref 0.76–1.27)
Globulin, Total: 2.6 g/dL (ref 1.5–4.5)
Glucose: 84 mg/dL (ref 70–99)
Potassium: 4.1 mmol/L (ref 3.5–5.2)
Sodium: 143 mmol/L (ref 134–144)
Total Protein: 6.6 g/dL (ref 6.0–8.5)
eGFR: 71 mL/min/1.73 (ref >59)

## 2024-06-24 LAB — LIPID PANEL W/O CHOL/HDL RATIO
Cholesterol, Total: 132 mg/dL (ref 100–199)
HDL: 44 mg/dL (ref >39)
LDL Chol Calc (NIH): 56 mg/dL (ref 0–99)
Triglycerides: 193 mg/dL — ABNORMAL HIGH (ref 0–149)
VLDL Cholesterol Cal: 32 mg/dL (ref 5–40)

## 2024-06-24 NOTE — Progress Notes (Signed)
 Contacted via MyChart  Good afternoon Mark Quinn, your labs have returned and overall remain stable.  Triglycerides are still a bit elevated, but have come down some. Continue to focus heavily on diet and exercise + continue all medications.  Any questions? Keep being amazing!!  Thank you for allowing me to participate in your care.  I appreciate you. Kindest regards, Koren Sermersheim

## 2024-06-25 ENCOUNTER — Ambulatory Visit: Admitting: Nurse Practitioner

## 2024-12-15 ENCOUNTER — Ambulatory Visit: Admitting: Cardiovascular Disease

## 2024-12-25 ENCOUNTER — Encounter: Admitting: Nurse Practitioner

## 2025-06-29 ENCOUNTER — Ambulatory Visit
